# Patient Record
Sex: Male | Born: 1970
Health system: Southern US, Community
[De-identification: ages and names within clinical notes are randomized; demographics above are authoritative.]

## PROBLEM LIST (undated history)

## (undated) DIAGNOSIS — Q6602 Congenital talipes equinovarus, left foot: Secondary | ICD-10-CM

## (undated) DIAGNOSIS — I4891 Unspecified atrial fibrillation: Secondary | ICD-10-CM

## (undated) DIAGNOSIS — E559 Vitamin D deficiency, unspecified: Secondary | ICD-10-CM

## (undated) DIAGNOSIS — R479 Unspecified speech disturbances: Secondary | ICD-10-CM

## (undated) DIAGNOSIS — N189 Chronic kidney disease, unspecified: Secondary | ICD-10-CM

## (undated) DIAGNOSIS — Q213 Tetralogy of Fallot: Secondary | ICD-10-CM

## (undated) DIAGNOSIS — I509 Heart failure, unspecified: Secondary | ICD-10-CM

## (undated) DIAGNOSIS — Q6601 Congenital talipes equinovarus, right foot: Secondary | ICD-10-CM

## (undated) DIAGNOSIS — Q6689 Other  specified congenital deformities of feet: Secondary | ICD-10-CM

## (undated) DIAGNOSIS — I10 Essential (primary) hypertension: Secondary | ICD-10-CM

## (undated) DIAGNOSIS — I517 Cardiomegaly: Secondary | ICD-10-CM

## (undated) HISTORY — PX: ABDOMINAL HERNIA REPAIR: SHX539

## (undated) HISTORY — DX: Unspecified atrial fibrillation: I48.91

## (undated) HISTORY — PX: VSD REPAIR: SHX276

## (undated) HISTORY — DX: Tetralogy of Fallot: Q21.3

## (undated) HISTORY — DX: Essential (primary) hypertension: I10

## (undated) HISTORY — PX: WRIST SURGERY: SHX841

## (undated) HISTORY — DX: Congenital talipes equinovarus, left foot: Q66.02

## (undated) HISTORY — PX: OTHER SURGICAL HISTORY: SHX169

## (undated) HISTORY — DX: Vitamin D deficiency, unspecified: E55.9

## (undated) HISTORY — PX: MOUTH SURGERY: SHX715

## (undated) HISTORY — DX: Heart failure, unspecified: I50.9

## (undated) HISTORY — DX: Congenital talipes equinovarus, right foot: Q66.01

## (undated) HISTORY — PX: ADENOIDECTOMY: SUR15

## (undated) HISTORY — DX: Other specified congenital deformities of feet: Q66.89

## (undated) HISTORY — DX: Cardiomegaly: I51.7

## (undated) HISTORY — DX: Chronic kidney disease, unspecified: N18.9

## (undated) HISTORY — DX: Unspecified speech disturbances: R47.9

---

## 2011-05-16 ENCOUNTER — Ambulatory Visit
Admission: RE | Admit: 2011-05-16 | Discharge: 2011-05-16 | Disposition: A | Discharge status: Home / Self Care | Payer: PRIVATE HEALTH INSURANCE | Source: Ambulatory Visit | Attending: Orthopedic Surgery | Admitting: Orthopedic Surgery

## 2011-05-16 ENCOUNTER — Other Ambulatory Visit: Payer: Self-pay | Admitting: Orthopedic Surgery

## 2011-05-16 DIAGNOSIS — S62109A Fracture of unspecified carpal bone, unspecified wrist, initial encounter for closed fracture: Secondary | ICD-10-CM

## 2011-05-20 ENCOUNTER — Ambulatory Visit (HOSPITAL_COMMUNITY)
Admission: RE | Admit: 2011-05-20 | Discharge: 2011-05-20 | Disposition: A | Discharge status: Home / Self Care | Payer: PRIVATE HEALTH INSURANCE | Source: Ambulatory Visit | Attending: Orthopedic Surgery | Admitting: Orthopedic Surgery

## 2011-05-20 ENCOUNTER — Other Ambulatory Visit: Payer: Self-pay | Admitting: Orthopedic Surgery

## 2011-05-20 ENCOUNTER — Encounter (HOSPITAL_COMMUNITY)
Admission: RE | Admit: 2011-05-20 | Discharge: 2011-05-20 | Disposition: A | Discharge status: Home / Self Care | Payer: PRIVATE HEALTH INSURANCE | Source: Ambulatory Visit | Attending: Orthopedic Surgery | Admitting: Orthopedic Surgery

## 2011-05-20 DIAGNOSIS — M4120 Other idiopathic scoliosis, site unspecified: Secondary | ICD-10-CM | POA: Insufficient documentation

## 2011-05-20 DIAGNOSIS — Z01812 Encounter for preprocedural laboratory examination: Secondary | ICD-10-CM | POA: Insufficient documentation

## 2011-05-20 DIAGNOSIS — Z01818 Encounter for other preprocedural examination: Secondary | ICD-10-CM

## 2011-05-20 DIAGNOSIS — I517 Cardiomegaly: Secondary | ICD-10-CM | POA: Insufficient documentation

## 2011-05-20 LAB — PROTIME-INR
INR: 0.99 (ref 0.00–1.49)
Prothrombin Time: 13.3 seconds (ref 11.6–15.2)

## 2011-05-20 LAB — SURGICAL PCR SCREEN
MRSA, PCR: NEGATIVE
Staphylococcus aureus: NEGATIVE

## 2011-05-20 LAB — CBC
Hemoglobin: 14.8 g/dL (ref 13.0–17.0)
Platelets: 155 10*3/uL (ref 150–400)
RBC: 4.73 MIL/uL (ref 4.22–5.81)

## 2011-05-20 LAB — BASIC METABOLIC PANEL
Calcium: 9.3 mg/dL (ref 8.4–10.5)
GFR calc Af Amer: 37 mL/min — ABNORMAL LOW (ref >60)
GFR calc non Af Amer: 31 mL/min — ABNORMAL LOW (ref >60)
Glucose, Bld: 83 mg/dL (ref 70–99)
Potassium: 5.6 mEq/L — ABNORMAL HIGH (ref 3.5–5.1)
Sodium: 138 mEq/L (ref 135–145)

## 2011-05-20 LAB — APTT: aPTT: 27 seconds (ref 24–37)

## 2011-05-23 ENCOUNTER — Ambulatory Visit (HOSPITAL_COMMUNITY)
Admission: RE | Admit: 2011-05-23 | Discharge: 2011-05-24 | Disposition: A | Discharge status: Home / Self Care | Payer: PRIVATE HEALTH INSURANCE | Source: Ambulatory Visit | Attending: Orthopedic Surgery | Admitting: Orthopedic Surgery

## 2011-05-23 DIAGNOSIS — Z01818 Encounter for other preprocedural examination: Secondary | ICD-10-CM | POA: Insufficient documentation

## 2011-05-23 DIAGNOSIS — Z01812 Encounter for preprocedural laboratory examination: Secondary | ICD-10-CM | POA: Insufficient documentation

## 2011-05-23 DIAGNOSIS — Z79899 Other long term (current) drug therapy: Secondary | ICD-10-CM | POA: Insufficient documentation

## 2011-05-23 DIAGNOSIS — Z7982 Long term (current) use of aspirin: Secondary | ICD-10-CM | POA: Insufficient documentation

## 2011-05-23 DIAGNOSIS — I10 Essential (primary) hypertension: Secondary | ICD-10-CM | POA: Insufficient documentation

## 2011-05-23 DIAGNOSIS — X58XXXA Exposure to other specified factors, initial encounter: Secondary | ICD-10-CM | POA: Insufficient documentation

## 2011-05-23 DIAGNOSIS — K219 Gastro-esophageal reflux disease without esophagitis: Secondary | ICD-10-CM | POA: Insufficient documentation

## 2011-05-23 DIAGNOSIS — Z86718 Personal history of other venous thrombosis and embolism: Secondary | ICD-10-CM | POA: Insufficient documentation

## 2011-05-23 DIAGNOSIS — S52539A Colles' fracture of unspecified radius, initial encounter for closed fracture: Secondary | ICD-10-CM | POA: Insufficient documentation

## 2011-05-23 LAB — POCT I-STAT, CHEM 8
BUN: 36 mg/dL — ABNORMAL HIGH (ref 6–23)
Calcium, Ion: 1.14 mmol/L (ref 1.12–1.32)
Hemoglobin: 15.3 g/dL (ref 13.0–17.0)
TCO2: 27 mmol/L (ref 0–100)

## 2011-06-04 NOTE — Op Note (Signed)
Bradley Graham, Bradley Graham                 ACCOUNT NO.:  [id number]  MEDICAL RECORD NO.:  [id number]  LOCATION:  [id number]                         FACILITY:  [facility]  PHYSICIAN:  Melrose Nakayama, MD  DATE OF BIRTH:  Jul 17, 1971  DATE OF PROCEDURE:  05/23/2011 DATE OF DISCHARGE:                              OPERATIVE REPORT   PREOPERATIVE DIAGNOSES:  Left wrist intra-articular distal radius fracture, radial styloid fracture.  POSTOPERATIVE DIAGNOSES:  Left wrist intra-articular distal radius fracture, radial styloid fracture.  ATTENDING SURGEON:  Linna Hoff IV, MD who scrubbed and present for the entire procedure.  ASSISTANT SURGEON:  None.  ANESTHESIA:  Axillary block with Graham sedation.  SURGICAL IMPLANT:  Biomet headless screws 3.0 mm, 2.13 mm, 32 mm, and 22 mm respective lengths.  SURGICAL PROCEDURES:  Open treatment of displaced intra-articular distal radius fracture, two more fragments.  RADIOGRAPHS REVIEWED:  Left wrist.SURGICAL INDICATIONS:  Bradley Graham is a 40 year old right-hand-dominant gentleman who sustained a displaced intra-articular distal radius fracture.  Preoperative flat plane was carried out and after options were discussed with he and his mother, they elected to proceed with the above procedure.  The risks, benefits, and alternatives were discussed in detail with the patient and his the mother, and a signed informed consent was obtained.  Risks include but not limited to bleeding, infection, damage to nearby nerves, arteries, or tendons, nonunion, malunion, hardware failure, loss motion of wrist and digits, and need for further surgical intervention.  DESCRIPTION OF PROCEDURE:  The patient was brought and identified in the preoperative holding area, mark with permanent marker was made on the left wrist indicate correct operative site.  The patient underwent axillary block anesthesia performed by Dr. Oletta Graham.  Left upper extremity, the patient was then brought  back to the operating room and placed supine on the anesthesia room table where the Graham sedation was administered.  Well-padded tourniquet was then placed on the left brachium and sealed wound with a 1000 drape.  Left upper extremity was then prepped and draped in normal sterile fashion.  Time-out was called. Correct side was identified and the procedure was then begun.  Attention was then turned to the left wrist where a longitudinal incision was made directly over the radial styloid section was then carried down through the skin, subcutaneous tissue, first dorsal part tendons were then incised longitudinally and tendon sheath released.  Brachioradialis was then released off the radial styloid to obtain visualization of the fracture site.  Fracture site was then opened up and then using a small reduction clamp, held adequately in place.  After open reduction was then performed, the two guidewires with the cannulated screws sets were then used and placed in a parallel fashion across perpendicular to the fracture site.  These were predrilled with the appropriate drill bit. The appropriate length cannulated screws were then placed 2.3 and 3.0. The K-wires were then removed.  Radiographs were obtained and reviewed showing adequate placement of the K-wire fixation of the screw fixation. Good restoration of the radial articular surface.  The wounds were then thoroughly irrigated.  Brachioradialis had been released, was not repaired.  The first dorsal compartment tendon sheath  was then reapproximated with 3-0 Monocryl suture.  Subcutaneous tissue was closed with 3-0 Monocryl suture and the skin closed with a running 4-0 Prolene subcuticular suture.  Tourniquet was then deflated.  There was good perfusion of the had.  After the patient was then placed in a well- molded volar splint, extubated and taken to recovery room in good condition.  Intraoperative radiographs 3 views of the wrist do show  the screw fixation in place in the radial styloid with good position of the implant and good articular congruity.  Intraoperative stress radiography was done during this case.  There was no intercarpal widening as well as assessment of the SL interval and LT interval.  POSTOPERATIVE PLAN:  The patient admitted overnight for Graham antibiotics and pain control, discharged in the morning, seen back in the office in approximately 10-14 days for wound check, suture removal, application of a total short-arm cast immobilization for 4 weeks radiographs in each visit.     Melrose Nakayama, MD     FWO/MEDQ  D:  05/23/2011  T:  05/24/2011  Job:  [id number]  Electronically Signed by Bradley Planas IV MD on 06/04/2011 08:45:56 PM

## 2011-12-09 DIAGNOSIS — N179 Acute kidney failure, unspecified: Secondary | ICD-10-CM | POA: Insufficient documentation

## 2011-12-15 DIAGNOSIS — I471 Supraventricular tachycardia: Secondary | ICD-10-CM | POA: Insufficient documentation

## 2011-12-15 DIAGNOSIS — I4892 Unspecified atrial flutter: Secondary | ICD-10-CM | POA: Insufficient documentation

## 2012-08-08 DIAGNOSIS — Q213 Tetralogy of Fallot: Secondary | ICD-10-CM | POA: Insufficient documentation

## 2012-09-17 ENCOUNTER — Ambulatory Visit: Payer: PRIVATE HEALTH INSURANCE | Attending: Orthopaedic Surgery | Admitting: Physical Therapy

## 2012-09-17 DIAGNOSIS — M25676 Stiffness of unspecified foot, not elsewhere classified: Secondary | ICD-10-CM | POA: Insufficient documentation

## 2012-09-17 DIAGNOSIS — IMO0001 Reserved for inherently not codable concepts without codable children: Secondary | ICD-10-CM | POA: Insufficient documentation

## 2012-09-17 DIAGNOSIS — M25579 Pain in unspecified ankle and joints of unspecified foot: Secondary | ICD-10-CM | POA: Insufficient documentation

## 2012-09-17 DIAGNOSIS — M25673 Stiffness of unspecified ankle, not elsewhere classified: Secondary | ICD-10-CM | POA: Insufficient documentation

## 2012-09-17 DIAGNOSIS — R5381 Other malaise: Secondary | ICD-10-CM | POA: Insufficient documentation

## 2012-09-20 ENCOUNTER — Ambulatory Visit: Payer: PRIVATE HEALTH INSURANCE | Admitting: Physical Therapy

## 2012-09-25 ENCOUNTER — Ambulatory Visit: Payer: PRIVATE HEALTH INSURANCE | Admitting: Physical Therapy

## 2012-09-27 ENCOUNTER — Ambulatory Visit: Payer: PRIVATE HEALTH INSURANCE | Admitting: Physical Therapy

## 2012-10-02 ENCOUNTER — Ambulatory Visit: Payer: PRIVATE HEALTH INSURANCE | Admitting: Physical Therapy

## 2012-10-04 ENCOUNTER — Ambulatory Visit: Payer: PRIVATE HEALTH INSURANCE | Admitting: Physical Therapy

## 2012-10-08 ENCOUNTER — Ambulatory Visit: Payer: PRIVATE HEALTH INSURANCE | Attending: Orthopaedic Surgery | Admitting: Physical Therapy

## 2012-10-08 DIAGNOSIS — M25676 Stiffness of unspecified foot, not elsewhere classified: Secondary | ICD-10-CM | POA: Insufficient documentation

## 2012-10-08 DIAGNOSIS — M25673 Stiffness of unspecified ankle, not elsewhere classified: Secondary | ICD-10-CM | POA: Insufficient documentation

## 2012-10-08 DIAGNOSIS — R5381 Other malaise: Secondary | ICD-10-CM | POA: Insufficient documentation

## 2012-10-08 DIAGNOSIS — M25579 Pain in unspecified ankle and joints of unspecified foot: Secondary | ICD-10-CM | POA: Insufficient documentation

## 2012-10-08 DIAGNOSIS — IMO0001 Reserved for inherently not codable concepts without codable children: Secondary | ICD-10-CM | POA: Insufficient documentation

## 2012-10-10 ENCOUNTER — Ambulatory Visit: Payer: PRIVATE HEALTH INSURANCE | Admitting: Physical Therapy

## 2012-10-12 ENCOUNTER — Ambulatory Visit: Payer: PRIVATE HEALTH INSURANCE | Admitting: *Deleted

## 2012-10-17 ENCOUNTER — Ambulatory Visit: Payer: PRIVATE HEALTH INSURANCE | Admitting: Physical Therapy

## 2012-10-19 ENCOUNTER — Ambulatory Visit: Payer: PRIVATE HEALTH INSURANCE | Admitting: Physical Therapy

## 2012-10-22 ENCOUNTER — Encounter: Payer: PRIVATE HEALTH INSURANCE | Admitting: Physical Therapy

## 2012-10-24 ENCOUNTER — Ambulatory Visit: Payer: PRIVATE HEALTH INSURANCE | Admitting: Physical Therapy

## 2012-10-26 ENCOUNTER — Ambulatory Visit: Payer: PRIVATE HEALTH INSURANCE | Admitting: Physical Therapy

## 2012-10-30 ENCOUNTER — Ambulatory Visit: Payer: PRIVATE HEALTH INSURANCE | Admitting: Physical Therapy

## 2012-10-31 ENCOUNTER — Ambulatory Visit: Payer: PRIVATE HEALTH INSURANCE | Admitting: Physical Therapy

## 2012-11-05 ENCOUNTER — Ambulatory Visit: Payer: PRIVATE HEALTH INSURANCE | Attending: Orthopaedic Surgery | Admitting: Physical Therapy

## 2012-11-05 DIAGNOSIS — M25676 Stiffness of unspecified foot, not elsewhere classified: Secondary | ICD-10-CM | POA: Insufficient documentation

## 2012-11-05 DIAGNOSIS — M25673 Stiffness of unspecified ankle, not elsewhere classified: Secondary | ICD-10-CM | POA: Insufficient documentation

## 2012-11-05 DIAGNOSIS — M25579 Pain in unspecified ankle and joints of unspecified foot: Secondary | ICD-10-CM | POA: Insufficient documentation

## 2012-11-05 DIAGNOSIS — IMO0001 Reserved for inherently not codable concepts without codable children: Secondary | ICD-10-CM | POA: Insufficient documentation

## 2012-11-05 DIAGNOSIS — R5381 Other malaise: Secondary | ICD-10-CM | POA: Insufficient documentation

## 2012-11-07 ENCOUNTER — Ambulatory Visit: Payer: PRIVATE HEALTH INSURANCE | Admitting: Physical Therapy

## 2012-11-09 ENCOUNTER — Ambulatory Visit: Payer: PRIVATE HEALTH INSURANCE | Admitting: Physical Therapy

## 2012-11-12 ENCOUNTER — Ambulatory Visit: Payer: PRIVATE HEALTH INSURANCE | Admitting: Physical Therapy

## 2012-11-14 ENCOUNTER — Ambulatory Visit: Payer: PRIVATE HEALTH INSURANCE | Admitting: Physical Therapy

## 2012-11-16 ENCOUNTER — Encounter: Payer: PRIVATE HEALTH INSURANCE | Admitting: Physical Therapy

## 2012-11-19 ENCOUNTER — Ambulatory Visit: Payer: PRIVATE HEALTH INSURANCE | Admitting: *Deleted

## 2013-01-30 DIAGNOSIS — G801 Spastic diplegic cerebral palsy: Secondary | ICD-10-CM | POA: Insufficient documentation

## 2013-05-20 ENCOUNTER — Ambulatory Visit: Payer: Self-pay | Admitting: Family Medicine

## 2013-06-25 ENCOUNTER — Ambulatory Visit (INDEPENDENT_AMBULATORY_CARE_PROVIDER_SITE_OTHER): Payer: PRIVATE HEALTH INSURANCE | Admitting: Family Medicine

## 2013-06-25 ENCOUNTER — Encounter: Payer: Self-pay | Admitting: Family Medicine

## 2013-06-25 VITALS — BP 127/85 | HR 108 | Temp 98.2°F | Wt 172.0 lb

## 2013-06-25 DIAGNOSIS — Q6689 Other  specified congenital deformities of feet: Secondary | ICD-10-CM

## 2013-06-25 DIAGNOSIS — Q6601 Congenital talipes equinovarus, right foot: Secondary | ICD-10-CM | POA: Insufficient documentation

## 2013-06-25 DIAGNOSIS — Z9889 Other specified postprocedural states: Secondary | ICD-10-CM

## 2013-06-25 DIAGNOSIS — Z8774 Personal history of (corrected) congenital malformations of heart and circulatory system: Secondary | ICD-10-CM

## 2013-06-25 DIAGNOSIS — R4789 Other speech disturbances: Secondary | ICD-10-CM

## 2013-06-25 DIAGNOSIS — N189 Chronic kidney disease, unspecified: Secondary | ICD-10-CM | POA: Insufficient documentation

## 2013-06-25 DIAGNOSIS — E559 Vitamin D deficiency, unspecified: Secondary | ICD-10-CM | POA: Insufficient documentation

## 2013-06-25 DIAGNOSIS — R479 Unspecified speech disturbances: Secondary | ICD-10-CM | POA: Insufficient documentation

## 2013-06-25 NOTE — Patient Instructions (Addendum)
      Dr Paula Libra Recommendations  Diet and Exercise discussed with patient.  For nutrition information, I recommend books:  1).Eat to Live by Dr Excell Seltzer. 2).Prevent and Reverse Heart Disease by Dr Karl Luke. 3) Dr Janene Harvey Book:  Program to Reverse Diabetes 4 ) the [place] Study by T. Heath Gold  Exercise recommendations are:   If unable to walk, then the patient can exercise in a chair 3 times a day. By flapping arms like a bird gently and raising legs outwards to the front.  If ambulatory, the patient can go for walks for 30 minutes 3 times a week. Then increase the intensity and duration as tolerated.  Goal is to try to attain exercise frequency to 5 times a week.  If applicable: Best to perform resistance exercises (machines or weights) 2 days a week and cardio type exercises 3 days per week.

## 2013-06-25 NOTE — Progress Notes (Signed)
Patient ID: Bradley Graham, male   DOB: [birth year], 42 y.o.   MRN: [id number] SUBJECTIVE: CC: Chief Complaint  Patient presents with  . Follow-up    6 month follow up     HPI:  Can tell when it is going to rain. Pressure feeling in his feet.   Has a positive report from his kidney doctors. Doing good. Labs off of mom's iPhone: Vitamin D deficient Cholesterol total:169 LDLc:85 HDLc:55 TG 131 Cr1.97 Vit D : 9.41 hgba1c 6.3  Multiple medical issues are all stable.  Past Medical History  Diagnosis Date  . Club foot of both feet   . Speech impediment   . Tetralogy of Fallot     s/p waterson shunt  . CKD (chronic kidney disease)   . Left ventricular hypertrophy   . Right ventricular hypertrophy   . CHF (congestive heart failure)   . Hypertension   . Atrial fibrillation   . Vitamin D deficiency    Past Surgical History  Procedure Laterality Date  . Vsd repair    . Scoliosis    . Adenoidectomy    . Mouth surgery    . Wrist surgery Left    History   Social History  . Marital Status: Single    Spouse Name: N/A    Number of Children: N/A  . Years of Education: N/A   Occupational History  . Not on file.   Social History Main Topics  . Smoking status: Never Smoker   . Smokeless tobacco: Not on file  . Alcohol Use: Not on file  . Drug Use: Not on file  . Sexually Active: Not on file   Other Topics Concern  . Not on file   Social History Narrative  . No narrative on file   Family History  Problem Relation Age of Onset  . Diabetes Mother   . Hyperlipidemia Mother   . Diabetes Father   . Hyperlipidemia Father    No current outpatient prescriptions on file prior to visit.   No current facility-administered medications on file prior to visit.   Allergies  Allergen Reactions  . Aleve (Naproxen Sodium)   . Bactrim (Sulfamethoxazole W-Trimethoprim)   . Erythromycin   . Heparin     Induced thrombopenia  . Nsaids   . Penicillins     There is no  immunization history on file for this patient. Prior to Admission medications   Medication Sig Start Date End Date Taking? Authorizing Provider  Cholecalciferol (VITAMIN D) 2000 UNITS CAPS Take 1 capsule by mouth daily.   Yes Historical Provider, MD  docusate sodium (COLACE) 100 MG capsule Take 100 mg by mouth daily.   Yes Historical Provider, MD  enalapril (VASOTEC) 5 MG tablet 5 mg 2 (two) times daily.  06/15/13  Yes Historical Provider, MD  loratadine (CLARITIN) 10 MG tablet Take 10 mg by mouth daily.   Yes Historical Provider, MD  lovastatin (MEVACOR) 20 MG tablet Take 20 mg by mouth at bedtime.  06/15/13  Yes Historical Provider, MD  pantoprazole (PROTONIX) 40 MG tablet Take 40 mg by mouth daily.  06/04/13  Yes Historical Provider, MD  aspirin 81 MG tablet Take 81 mg by mouth daily.    Historical Provider, MD     ROS: As above in the HPI. All other systems are stable or negative.  OBJECTIVE: APPEARANCE:  Patient in no acute distress.The patient appeared well nourished and normally developed. Acyanotic. Waist: VITAL SIGNS:BP 127/85  Pulse 108  Temp(Src) 98.2  F (36.8 C) (Oral)  Wt 172 lb (78.019 kg)  WM  SKIN: warm and  Dry without overt rashes, tattoos and scars  HEAD and Neck: without JVD, Head and scalp: normal Eyes:No scleral icterus. Fundi normal, eye movements normal. Ears: Auricle normal, canal normal, Tympanic membranes normal, insufflation normal. Nose: normal Throat: no changesNeck & thyroid: no changes  CHEST & LUNGS: Chest wall: normal Lungs: Clear  CVS: Reveals the PMI to be normally located. Regular rhythm, No changes in his heart murmurs. S/P TOF with cardiac repairs.  Peripheral vasculature: Radial pulses: normal Dorsal pedis pulses: normal Posterior pulses: normal  ABDOMEN:  Appearance: normal Benign, no organomegaly, no masses, no Abdominal Aortic enlargement. No Guarding , no rebound. No Bruits. Bowel sounds: normal  RECTAL: N/A GU:  N/A  EXTREMETIES: nonedematous. l.  MUSCULOSKELETAL:  Feet deformities from club foot surgeries are stable and looks good.  NEUROLOGIC: oriented to time,place and person; no changes.  ASSESSMENT: Speech impediment secondary to organic lesion  S/P TOF (tetralogy of Fallot) repair  CKD (chronic kidney disease), unspecified stage  Vitamin D deficiency  Club foot of both feet - S/P surgical reconstruction.  PLAN: Patient being followed regular by cardiology and renal at [facility] and the appropriate labs are drawn. Mom is now  Retired and fulltime at home and able to be more available in the day to day needs of the patient who is active and ambulatory. No change in meds. Will need to see annually.  Have  Reports sent to [place] regularly from [facility].  Return in about 1 year (around 06/25/2014) for Recheck medical problems.  Christon Parada P. Jacelyn Grip, M.D.

## 2014-01-20 ENCOUNTER — Telehealth: Payer: Self-pay | Admitting: Family Medicine

## 2014-01-20 NOTE — Telephone Encounter (Signed)
Spoke with mother and she was offered appt for thurs but told her if weather was not bad to please call us tomorrow and we would get him in

## 2014-01-23 ENCOUNTER — Ambulatory Visit: Payer: PRIVATE HEALTH INSURANCE | Admitting: Family Medicine

## 2014-01-27 ENCOUNTER — Encounter: Payer: Self-pay | Admitting: Family Medicine

## 2014-01-27 ENCOUNTER — Ambulatory Visit (INDEPENDENT_AMBULATORY_CARE_PROVIDER_SITE_OTHER): Payer: PRIVATE HEALTH INSURANCE

## 2014-01-27 ENCOUNTER — Ambulatory Visit (INDEPENDENT_AMBULATORY_CARE_PROVIDER_SITE_OTHER): Payer: PRIVATE HEALTH INSURANCE | Admitting: Family Medicine

## 2014-01-27 VITALS — BP 143/84 | HR 100 | Temp 97.9°F | Ht 64.0 in | Wt 180.0 lb

## 2014-01-27 DIAGNOSIS — Z8774 Personal history of (corrected) congenital malformations of heart and circulatory system: Secondary | ICD-10-CM

## 2014-01-27 DIAGNOSIS — Q6601 Congenital talipes equinovarus, right foot: Secondary | ICD-10-CM

## 2014-01-27 DIAGNOSIS — R52 Pain, unspecified: Secondary | ICD-10-CM

## 2014-01-27 DIAGNOSIS — N189 Chronic kidney disease, unspecified: Secondary | ICD-10-CM

## 2014-01-27 DIAGNOSIS — R479 Unspecified speech disturbances: Secondary | ICD-10-CM

## 2014-01-27 DIAGNOSIS — E559 Vitamin D deficiency, unspecified: Secondary | ICD-10-CM

## 2014-01-27 DIAGNOSIS — Q6600 Congenital talipes equinovarus, unspecified foot: Secondary | ICD-10-CM

## 2014-01-27 DIAGNOSIS — K439 Ventral hernia without obstruction or gangrene: Secondary | ICD-10-CM

## 2014-01-27 DIAGNOSIS — R4789 Other speech disturbances: Secondary | ICD-10-CM

## 2014-01-27 DIAGNOSIS — Z9889 Other specified postprocedural states: Secondary | ICD-10-CM

## 2014-01-27 DIAGNOSIS — Q6602 Congenital talipes equinovarus, left foot: Secondary | ICD-10-CM

## 2014-01-27 NOTE — Progress Notes (Signed)
Patient ID: Bradley Graham, male   DOB: 14-May-1971, 43 y.o.   MRN: [id number] SUBJECTIVE: CC: Chief Complaint  Patient presents with  . Acute Visit    place on left chest area  mother states has  been walking on treadmill     HPI: Goes to PT and was exercising. 2 weeks ago. Was sore in the upper abdomen and had a swelling in the area of his  Surgery. It is getting better now. Was to get a cardiac MRI but he physically couldn't get it done. So the cardiologist at [facility] is considering what options are available to evaluate the cardiac valves. He is doing well otherwise.  Past Medical History  Diagnosis Date  . Club foot of both feet   . Speech impediment   . Tetralogy of Fallot     s/p waterson shunt  . CKD (chronic kidney disease)   . Left ventricular hypertrophy   . Right ventricular hypertrophy   . CHF (congestive heart failure)   . Hypertension   . Atrial fibrillation   . Vitamin D deficiency    Past Surgical History  Procedure Laterality Date  . Vsd repair    . Scoliosis    . Adenoidectomy    . Mouth surgery    . Wrist surgery Left    History   Social History  . Marital Status: Single    Spouse Name: N/A    Number of Children: N/A  . Years of Education: N/A   Occupational History  . Not on file.   Social History Main Topics  . Smoking status: Never Smoker   . Smokeless tobacco: Not on file  . Alcohol Use: Not on file  . Drug Use: Not on file  . Sexual Activity: Not on file   Other Topics Concern  . Not on file   Social History Narrative  . No narrative on file   Family History  Problem Relation Age of Onset  . Diabetes Mother   . Hyperlipidemia Mother   . Diabetes Father   . Hyperlipidemia Father    Current Outpatient Prescriptions on File Prior to Visit  Medication Sig Dispense Refill  . aspirin 81 MG tablet Take 81 mg by mouth daily.      . Cholecalciferol (VITAMIN D) 2000 UNITS CAPS Take 1 capsule by mouth daily.      Marland Kitchen docusate sodium  (COLACE) 100 MG capsule Take 100 mg by mouth daily.      . enalapril (VASOTEC) 5 MG tablet 5 mg 2 (two) times daily.       Marland Kitchen loratadine (CLARITIN) 10 MG tablet Take 10 mg by mouth daily.      Marland Kitchen lovastatin (MEVACOR) 20 MG tablet Take 20 mg by mouth at bedtime.       . pantoprazole (PROTONIX) 40 MG tablet Take 40 mg by mouth daily.        No current facility-administered medications on file prior to visit.   Allergies  Allergen Reactions  . Aleve [Naproxen Sodium]   . Bactrim [Sulfamethoxazole-Trimethoprim]   . Erythromycin   . Heparin     Induced thrombopenia  . Nsaids   . Penicillins     There is no immunization history on file for this patient. Prior to Admission medications   Medication Sig Start Date End Date Taking? Authorizing Provider  aspirin 81 MG tablet Take 81 mg by mouth daily.    Historical Provider, MD  Cholecalciferol (VITAMIN D) 2000 UNITS CAPS Take 1  capsule by mouth daily.    Historical Provider, MD  docusate sodium (COLACE) 100 MG capsule Take 100 mg by mouth daily.    Historical Provider, MD  enalapril (VASOTEC) 5 MG tablet 5 mg 2 (two) times daily.  06/15/13   Historical Provider, MD  loratadine (CLARITIN) 10 MG tablet Take 10 mg by mouth daily.    Historical Provider, MD  lovastatin (MEVACOR) 20 MG tablet Take 20 mg by mouth at bedtime.  06/15/13   Historical Provider, MD  pantoprazole (PROTONIX) 40 MG tablet Take 40 mg by mouth daily.  06/04/13   Historical Provider, MD     ROS: As above in the HPI. All other systems are stable or negative.  OBJECTIVE: APPEARANCE:  Patient in no acute distress.The patient appeared well nourished and normally developed. Acyanotic. Waist: VITAL SIGNS:BP 143/84  Pulse 100  Temp(Src) 97.9 F (36.6 C) (Oral)  Ht 5\' 4"  (1.626 m)  Wt 180 lb (81.647 kg)  BMI 30.88 kg/m2  WM with speech deficit . Not new  Kyphoscoliosis.   SKIN: warm and  Dry without overt rashes, tattoos and scars  HEAD and Neck: without JVD, Head and  scalp: normal Eyes:No scleral icterus. Fundi normal, eye movements normal. Ears: Auricle normal, canal normal, Tympanic membranes normal, insufflation normal. Nose: normal Throat: normal Neck & thyroid: normal  CHEST & LUNGS: Chest wall: normal Lungs: Clear  CVS:  Regular rhythm, First and Second Heart sounds no changeabsence of murmurs, rubs or gallops. No change in heart sounds and murmur. Peripheral vasculature: Radial pulses: normal Dorsal pedis pulses: normal Posterior pulses: normal  ABDOMEN:  Appearance: obese with a 1 1/2 diameter abdominal weakness and ventral hernia to the left of the epigastrium. Near his surgical scars.  Benign, no organomegaly, no masses, no Abdominal Aortic enlargement. No Guarding , no rebound. No Bruits. Bowel sounds: normal  RECTAL: N/A GU: N/A  EXTREMETIES: nonedematous.  MUSCULOSKELETAL:  S/p Club foot surgery doing better  NEUROLOGIC: oriented to time,place and person; nonfocal.  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 DG Chest 2 View    Standing Status: Future     Number of Occurrences: 1     Standing Expiration Date: 03/29/2015    Order Specific Question:  Reason for Exam (SYMPTOM  OR DIAGNOSIS REQUIRED)    Answer:  pain    Order Specific Question:  Preferred imaging location?    Answer:  Internal  . Ambulatory referral to General Surgery    Referral Priority:  Routine    Referral Type:  Surgical    Referral Reason:  Specialty Services Required    Requested Specialty:  General Surgery    Number of Visits Requested:  1  [facility] reading (PRIMARY) by  Dr. Jacelyn Grip.Kyphoscoliosis, cardiomegaly. Chronic changes from  S/P Tetralogy of Fallot and surgery as a child.                                Meds ordered this encounter  Medications  . clindamycin (CLEOCIN) 150 MG capsule    Sig:    There are no discontinued medications. Return if symptoms worsen or fail  to improve, for Recheck medical problems. He is followed at [facility] and comes here prn and annually.  Lawana Hartzell P. Jacelyn Grip, M.D.

## 2014-02-01 ENCOUNTER — Ambulatory Visit (INDEPENDENT_AMBULATORY_CARE_PROVIDER_SITE_OTHER): Payer: PRIVATE HEALTH INSURANCE | Admitting: Family Medicine

## 2014-02-01 ENCOUNTER — Encounter: Payer: Self-pay | Admitting: Family Medicine

## 2014-02-01 VITALS — BP 137/84 | HR 101 | Temp 98.0°F | Ht 64.0 in | Wt 183.2 lb

## 2014-02-01 DIAGNOSIS — J329 Chronic sinusitis, unspecified: Secondary | ICD-10-CM

## 2014-02-01 DIAGNOSIS — H60399 Other infective otitis externa, unspecified ear: Secondary | ICD-10-CM

## 2014-02-01 MED ORDER — NEOMYCIN-COLIST-HC-THONZONIUM 3.3-3-10-0.5 MG/ML OT SUSP: 3.0000 [drp] | Freq: Four times a day (QID) | OTIC | Status: DC

## 2014-02-01 MED ORDER — LEVOFLOXACIN 500 MG PO TABS: 500.0000 mg | ORAL_TABLET | Freq: Every day | ORAL | Status: DC

## 2014-02-01 NOTE — Progress Notes (Signed)
   Subjective:    Patient ID: Bradley Graham, male    DOB: Jun 03, 1971, 43 y.o.   MRN: [id number]  HPI  This 43 y.o. male presents for evaluation of URI and right ear discomfort for 2 days. He has hx of otitis externa..  Review of Systems C/o right ear discomfort and URI sx's   No chest pain, SOB, HA, dizziness, vision change, N/V, diarrhea, constipation, dysuria, urinary urgency or frequency, myalgias, arthralgias or rash.  Objective:   Physical Exam  Vital signs noted  Well developed well nourished male.  HEENT - Head atraumatic Normocephalic                Eyes - PERRLA, Conjuctiva - clear Sclera- Clear EOMI                Ears - Right EAC with whitish clear drainage and TM not visualized and left EAC wnl                            And TM normal                Nose - Nares patent                 Throat - oropharanx wnl Respiratory - Lungs CTA bilateral Cardiac - RRR S1 and S2 without murmur       Assessment & Plan:  Sinusitis - Plan: levofloxacin (LEVAQUIN) 500 MG tablet, neomycin-colistin-hydrocortisone-thonzonium (CORTISPORIN-TC) 3.02-04-09-0.5 MG/ML otic suspension  Otitis, externa, infective - Plan: levofloxacin (LEVAQUIN) 500 MG tablet, neomycin-colistin-hydrocortisone-thonzonium (CORTISPORIN-TC) 3.02-04-09-0.5 MG/ML otic suspension  Push po fluids, rest, tylenol and motrin otc prn as directed for fever, arthralgias, and myalgias.  Follow up prn if sx's continue or persist.  Lysbeth Penner FNP

## 2014-02-13 ENCOUNTER — Telehealth: Payer: Self-pay | Admitting: Family Medicine

## 2014-02-14 ENCOUNTER — Encounter (INDEPENDENT_AMBULATORY_CARE_PROVIDER_SITE_OTHER): Payer: Self-pay | Admitting: Surgery

## 2014-02-20 ENCOUNTER — Ambulatory Visit (INDEPENDENT_AMBULATORY_CARE_PROVIDER_SITE_OTHER): Payer: PRIVATE HEALTH INSURANCE | Admitting: Surgery

## 2014-02-28 ENCOUNTER — Ambulatory Visit (INDEPENDENT_AMBULATORY_CARE_PROVIDER_SITE_OTHER): Payer: PRIVATE HEALTH INSURANCE | Admitting: Surgery

## 2014-02-28 ENCOUNTER — Encounter (INDEPENDENT_AMBULATORY_CARE_PROVIDER_SITE_OTHER): Payer: Self-pay | Admitting: Surgery

## 2014-02-28 VITALS — BP 126/80 | HR 76 | Temp 98.7°F | Resp 16 | Ht 64.0 in | Wt 183.0 lb

## 2014-02-28 DIAGNOSIS — K432 Incisional hernia without obstruction or gangrene: Secondary | ICD-10-CM | POA: Insufficient documentation

## 2014-02-28 NOTE — Progress Notes (Signed)
Patient ID: Bradley Graham, male   DOB: [birth year], 43 y.o.   MRN: [id number]  Chief Complaint  Patient presents with  . Incisional Hernia    HPI Bradley Graham is a 43 y.o. male.   HPI This is a pleasant gentleman who is accompanied by his mother. He is referred to me by Dr. Yaakov Guthrie for evaluation of a incisional hernia. The patient was recently lifting weights and noticed discomfort in the upper abdomen and was found to have an incisional hernia. It is difficult to tell how long he has actually had the hernia. He has significant surgery with complications in [id number] and was on the ventilator and in the intensive care area for several months. He recently had foot surgery at [facility]. It was during this rehabilitation when the hernia was noticed. His mother reports that he will have pain now he gets out of bed but she is uncertain whether his role pain or whether he is just saying that because of the knowledge of the hernia. He does have some mental issues. He denies nausea or vomiting. His bowels were moving well Past Medical History  Diagnosis Date  . Club foot of both feet   . Speech impediment   . Tetralogy of Fallot     s/p waterson shunt  . CKD (chronic kidney disease)   . Left ventricular hypertrophy   . Right ventricular hypertrophy   . CHF (congestive heart failure)   . Hypertension   . Atrial fibrillation   . Vitamin D deficiency     Past Surgical History  Procedure Laterality Date  . Vsd repair    . Scoliosis    . Adenoidectomy    . Mouth surgery    . Wrist surgery Left     Family History  Problem Relation Age of Onset  . Diabetes Mother   . Hyperlipidemia Mother   . Diabetes Father   . Hyperlipidemia Father     Social History History  Substance Use Topics  . Smoking status: Never Smoker   . Smokeless tobacco: Not on file  . Alcohol Use: No    Allergies  Allergen Reactions  . Aleve [Naproxen Sodium]   . Bactrim [Sulfamethoxazole-Trimethoprim]   .  Erythromycin   . Heparin     Induced thrombopenia  . Nsaids   . Penicillins     Current Outpatient Prescriptions  Medication Sig Dispense Refill  . aspirin 81 MG tablet Take 81 mg by mouth daily.      . Cholecalciferol (VITAMIN D) 2000 UNITS CAPS Take 1 capsule by mouth daily.      . clindamycin (CLEOCIN) 150 MG capsule       . docusate sodium (COLACE) 100 MG capsule Take 100 mg by mouth daily.      . enalapril (VASOTEC) 5 MG tablet 5 mg 2 (two) times daily.       Marland Kitchen loratadine (CLARITIN) 10 MG tablet Take 10 mg by mouth daily.      Marland Kitchen lovastatin (MEVACOR) 20 MG tablet Take 20 mg by mouth at bedtime.       Marland Kitchen neomycin-colistin-hydrocortisone-thonzonium (CORTISPORIN-TC) 3.02-04-09-0.5 MG/ML otic suspension Place 3 drops into the right ear 4 (four) times daily.  10 mL  0  . pantoprazole (PROTONIX) 40 MG tablet Take 40 mg by mouth daily.        No current facility-administered medications for this visit.    Review of Systems Review of Systems  Constitutional: Negative for fever, chills and  unexpected weight change.  HENT: Negative for congestion, hearing loss, sore throat, trouble swallowing and voice change.   Eyes: Negative for visual disturbance.  Respiratory: Negative for cough and wheezing.   Cardiovascular: Positive for palpitations. Negative for chest pain and leg swelling.  Gastrointestinal: Negative for nausea, vomiting, abdominal pain, diarrhea, constipation, blood in stool, abdominal distention, anal bleeding and rectal pain.  Genitourinary: Negative for hematuria and difficulty urinating.  Musculoskeletal: Positive for back pain, gait problem, joint swelling and neck stiffness. Negative for arthralgias.  Skin: Negative for rash and wound.  Neurological: Negative for seizures, syncope, weakness and headaches.  Hematological: Negative for adenopathy. Does not bruise/bleed easily.  Psychiatric/Behavioral: Negative for confusion.    Blood pressure 126/80, pulse 76, temperature  98.7 F (37.1 C), temperature source Oral, resp. rate 16, height 5\' 4"  (1.626 m), weight 183 lb (83.008 kg).  Physical Exam Physical Exam  Constitutional: He is oriented to person, place, and time. He appears well-developed and well-nourished. No distress.  HENT:  Head: Normocephalic and atraumatic.  Right Ear: External ear normal.  Left Ear: External ear normal.  Nose: Nose normal.  Mouth/Throat: Oropharynx is clear and moist. No oropharyngeal exudate.  Eyes: Conjunctivae are normal. Pupils are equal, round, and reactive to light. Right eye exhibits no discharge. Left eye exhibits no discharge. No scleral icterus.  Neck: Normal range of motion. Neck supple. No tracheal deviation present.  Cardiovascular: Normal rate, regular rhythm and intact distal pulses.   Murmur heard. Pulmonary/Chest: Effort normal and breath sounds normal. No respiratory distress. He has no wheezes.  Abdominal: Soft. Bowel sounds are normal. He exhibits no distension. There is no tenderness. There is no guarding.  Multiple well-healed incisions. There is an incisional hernia in the upper abdomen below the xiphoid as well as an incisional hernia at the umbilicus. Both are easily reducible  Musculoskeletal: He exhibits edema and tenderness.  Lymphadenopathy:    He has no cervical adenopathy.  Neurological: He is alert and oriented to person, place, and time.  Skin: Skin is warm and dry. No rash noted. No pallor.  Psychiatric: His behavior is normal.    Data Reviewed   Assessment    Incisional hernia     Plan    I discussed the diagnosis with the patient and his mother. He certainly has major comorbidities but has done well with his most recent surgery on his feet under general anesthesia. Again, it is difficult to tell given his mental status with her is actually symptomatic from a hernia or not. I discussed hernia repair with the patient and his mother in detail. I discussed laparoscopic repair as well as  use of mesh. I discussed the risk of his comorbidities. I discussed the risk of incarceration should the hernia be followed which I believe is small. The patient's mother is going to go home and think about things for a while and then decide whether or not she wanted to have surgery Versus continue expected management.        Ilianna Bown A 02/28/2014, 10:42 AM

## 2014-04-08 ENCOUNTER — Encounter: Payer: Self-pay | Admitting: Family Medicine

## 2014-04-08 ENCOUNTER — Ambulatory Visit (INDEPENDENT_AMBULATORY_CARE_PROVIDER_SITE_OTHER): Payer: PRIVATE HEALTH INSURANCE | Admitting: Family Medicine

## 2014-04-08 VITALS — BP 164/90 | HR 92 | Temp 98.6°F | Ht 64.0 in | Wt 182.0 lb

## 2014-04-08 DIAGNOSIS — J069 Acute upper respiratory infection, unspecified: Secondary | ICD-10-CM

## 2014-04-08 MED ORDER — METHYLPREDNISOLONE ACETATE 80 MG/ML IJ SUSP
80.0000 mg | Freq: Once | INTRAMUSCULAR | Status: AC
  Administered 2014-04-08: 80 mg via INTRAMUSCULAR

## 2014-04-08 MED ORDER — DOXYCYCLINE HYCLATE 100 MG PO TABS: 100.0000 mg | ORAL_TABLET | Freq: Two times a day (BID) | ORAL | Status: DC

## 2014-04-08 NOTE — Progress Notes (Signed)
   Subjective:    Patient ID: Bradley Graham, male    DOB: 05-06-1971, 43 y.o.   MRN: [id number]  HPI This 43 y.o. male presents for evaluation of uri for over a week.   Review of Systems    No chest pain, SOB, HA, dizziness, vision change, N/V, diarrhea, constipation, dysuria, urinary urgency or frequency, myalgias, arthralgias or rash.  Objective:   Physical Exam Vital signs noted  Well developed well nourished male.  HEENT - Head atraumatic Normocephalic                Eyes - PERRLA, Conjuctiva - clear Sclera- Clear EOMI                Ears - EAC's Wnl TM's Wnl Gross Hearing WNL                Nose - Nares patent                 Throat - oropharanx wnl Respiratory - Lungs CTA bilateral Cardiac - RRR S1 and S2 without murmur GI - Abdomen soft Nontender and bowel sounds active x 4 Extremities - No edema. Neuro - Grossly intact.       Assessment & Plan:  URI (upper respiratory infection) - Plan: doxycycline (VIBRA-TABS) 100 MG tablet, methylPREDNISolone acetate (DEPO-MEDROL) injection 80 mg Push po fluids, rest, tylenol and motrin otc prn as directed for fever, arthralgias, and myalgias.  Follow up prn if sx's continue or persist.  Lysbeth Penner FNP

## 2014-04-29 ENCOUNTER — Ambulatory Visit (INDEPENDENT_AMBULATORY_CARE_PROVIDER_SITE_OTHER): Payer: PRIVATE HEALTH INSURANCE | Admitting: Family Medicine

## 2014-04-29 ENCOUNTER — Encounter: Payer: Self-pay | Admitting: Family Medicine

## 2014-04-29 VITALS — BP 134/82 | HR 104 | Temp 98.1°F | Ht 64.0 in | Wt 179.8 lb

## 2014-04-29 DIAGNOSIS — J069 Acute upper respiratory infection, unspecified: Secondary | ICD-10-CM

## 2014-04-29 MED ORDER — PREDNISONE 10 MG PO TABS: ORAL_TABLET | ORAL | Status: DC

## 2014-04-29 MED ORDER — DOXYCYCLINE HYCLATE 100 MG PO TABS: 100.0000 mg | ORAL_TABLET | Freq: Two times a day (BID) | ORAL | Status: DC

## 2014-04-29 NOTE — Progress Notes (Signed)
   Subjective:    Patient ID: Bradley Graham, male    DOB: 02/27/1971, 43 y.o.   MRN: [id number]  HPI  This 43 y.o. male presents for evaluation of URI sx's persisting.  He has felt better after the Medrol shot but became ill again a few days later.  He is coughing and wheezing now and it has Moved from his head to his chest.  Review of Systems C/o cough and uri sx's   No chest pain, SOB, HA, dizziness, vision change, N/V, diarrhea, constipation, dysuria, urinary urgency or frequency, myalgias, arthralgias or rash.  Objective:   Physical Exam  Vital signs noted  Well developed well nourished male.  HEENT - Head atraumatic Normocephalic                Eyes - PERRLA, Conjuctiva - clear Sclera- Clear EOMI                Ears - EAC's Wnl TM's Wnl Gross Hearing                 Throat - oropharanx wnl Respiratory - Lungs with expiratory wheezes Cardiac - RRR S1 and S2 without murmur GI - Abdomen soft Nontender and bowel sounds active x 4     Assessment & Plan:  URI (upper respiratory infection) - Plan: doxycycline (VIBRA-TABS) 100 MG tablet, predniSONE (DELTASONE) 10 MG tablet  Taper Push po fluids, rest, tylenol and motrin otc prn as directed for fever, arthralgias, and myalgias.  Follow up prn if sx's continue or persist.  Lysbeth Penner FNP

## 2014-07-21 ENCOUNTER — Ambulatory Visit (INDEPENDENT_AMBULATORY_CARE_PROVIDER_SITE_OTHER): Payer: PRIVATE HEALTH INSURANCE | Admitting: Surgery

## 2014-07-21 VITALS — BP 134/84 | HR 99 | Temp 98.3°F | Ht 64.0 in | Wt 184.2 lb

## 2014-07-21 DIAGNOSIS — K432 Incisional hernia without obstruction or gangrene: Secondary | ICD-10-CM

## 2014-07-21 NOTE — Progress Notes (Signed)
Subjective:     Patient ID: Bradley Graham, male   DOB: 11/23/1971, 43 y.o.   MRN: [id number]  HPI He is again accompanied by his mother to discuss whether or not to proceed with hernia repair. I saw him back in March. Again he has a significant history of multiple surgeries and complications requiring long-term care intensive care unit 2007. He has an incisional hernia in the epigastrium up to the xiphoid which is becoming increasingly symptomatic. He mostly has pressure and pain with no obstructive symptoms  Review of Systems     Objective:   Physical Exam On exam, there is a reducible hernia in the epigastrium as well as a small one at the umbilicus    Assessment:     Incisional hernia     Plan:     I again had a long discussion with his mother who makes all decisions for him. Again his surgery is fairly high risk given the fact that I may have to convert to an open procedure because of his previous jejunostomy tube. She is wanting about this and discuss this with her husband further prior to consenting to surgery. She will call us back and let us know

## 2014-07-21 NOTE — Patient Instructions (Signed)
Call us back and let us know whether you have decided on surgery

## 2014-10-20 DIAGNOSIS — E663 Overweight: Secondary | ICD-10-CM | POA: Insufficient documentation

## 2014-10-20 DIAGNOSIS — E66811 Obesity, class 1: Secondary | ICD-10-CM | POA: Insufficient documentation

## 2014-11-03 ENCOUNTER — Encounter: Payer: Self-pay | Admitting: Family Medicine

## 2014-11-03 ENCOUNTER — Ambulatory Visit (INDEPENDENT_AMBULATORY_CARE_PROVIDER_SITE_OTHER): Payer: PRIVATE HEALTH INSURANCE | Admitting: Family Medicine

## 2014-11-03 VITALS — BP 139/86 | HR 109 | Temp 98.7°F | Ht 64.0 in | Wt 177.0 lb

## 2014-11-03 DIAGNOSIS — J206 Acute bronchitis due to rhinovirus: Secondary | ICD-10-CM

## 2014-11-03 DIAGNOSIS — H60391 Other infective otitis externa, right ear: Secondary | ICD-10-CM

## 2014-11-03 MED ORDER — BENZONATATE 100 MG PO CAPS: 100.0000 mg | ORAL_CAPSULE | Freq: Three times a day (TID) | ORAL | Status: DC | PRN

## 2014-11-03 MED ORDER — CIPROFLOXACIN HCL 500 MG PO TABS: 500.0000 mg | ORAL_TABLET | Freq: Two times a day (BID) | ORAL | Status: DC

## 2014-11-03 MED ORDER — CIPROFLOXACIN-HYDROCORTISONE 0.2-1 % OT SUSP: 3.0000 [drp] | Freq: Two times a day (BID) | OTIC | Status: DC

## 2014-11-03 MED ORDER — METHYLPREDNISOLONE ACETATE 80 MG/ML IJ SUSP
80.0000 mg | Freq: Once | INTRAMUSCULAR | Status: AC
  Administered 2014-11-03: 80 mg via INTRAMUSCULAR

## 2014-11-03 NOTE — Progress Notes (Signed)
   Subjective:    Patient ID: Bradley Graham, male    DOB: 01-25-1971, 43 y.o.   MRN: [id number]  HPI Patient is here for c/o uri sx's and right ear discharge.  Review of Systems  Constitutional: Negative for fever.  HENT: Negative for ear pain.   Eyes: Negative for discharge.  Respiratory: Negative for cough.   Cardiovascular: Negative for chest pain.  Gastrointestinal: Negative for abdominal distention.  Endocrine: Negative for polyuria.  Genitourinary: Negative for difficulty urinating.  Musculoskeletal: Negative for gait problem and neck pain.  Skin: Negative for color change and rash.  Neurological: Negative for speech difficulty and headaches.  Psychiatric/Behavioral: Negative for agitation.       Objective:    BP 139/86 mmHg  Pulse 109  Temp(Src) 98.7 F (37.1 C) (Oral)  Ht 5\' 4"  (1.626 m)  Wt 177 lb (80.287 kg)  BMI 30.37 kg/m2 Physical Exam  Constitutional: He is oriented to person, place, and time. He appears well-developed and well-nourished.  HENT:  Head: Normocephalic and atraumatic.  Mouth/Throat: Oropharynx is clear and moist.  Right EAC with white drainage  Eyes: Pupils are equal, round, and reactive to light.  Neck: Normal range of motion. Neck supple.  Cardiovascular: Normal rate and regular rhythm.   No murmur heard. Pulmonary/Chest: Effort normal and breath sounds normal.  Abdominal: Soft. Bowel sounds are normal. There is no tenderness.  Neurological: He is alert and oriented to person, place, and time.  Skin: Skin is warm and dry.  Psychiatric: He has a normal mood and affect.          Assessment & Plan:     ICD-9-CM ICD-10-CM   1. Otitis, externa, infective, right 380.10 H60.391 methylPREDNISolone acetate (DEPO-MEDROL) injection 80 mg     ciprofloxacin-hydrocortisone (CIPRO HC) otic suspension     ciprofloxacin (CIPRO) 500 MG tablet  2. Acute bronchitis due to Rhinovirus 466.0 J20.6 methylPREDNISolone acetate (DEPO-MEDROL) injection 80  mg   079.3  ciprofloxacin (CIPRO) 500 MG tablet     benzonatate (TESSALON PERLES) 100 MG capsule   Push po fluids, rest, tylenol and motrin otc prn as directed for fever, arthralgias, and myalgias.  Follow up prn if sx's continue or persist.  Return if symptoms worsen or fail to improve.  Lysbeth Penner FNP

## 2014-11-03 NOTE — Addendum Note (Signed)
Addended by: Marin Olp on: 11/03/2014 05:56 PM   Modules accepted: Miquel Dunn

## 2014-11-10 ENCOUNTER — Ambulatory Visit (INDEPENDENT_AMBULATORY_CARE_PROVIDER_SITE_OTHER): Payer: PRIVATE HEALTH INSURANCE | Admitting: Family Medicine

## 2014-11-10 VITALS — BP 139/77 | HR 103 | Temp 97.1°F | Ht 64.0 in | Wt 176.0 lb

## 2014-11-10 DIAGNOSIS — H60391 Other infective otitis externa, right ear: Secondary | ICD-10-CM

## 2014-11-10 DIAGNOSIS — J206 Acute bronchitis due to rhinovirus: Secondary | ICD-10-CM

## 2014-11-10 MED ORDER — CIPROFLOXACIN HCL 500 MG PO TABS: 500.0000 mg | ORAL_TABLET | Freq: Two times a day (BID) | ORAL | Status: DC

## 2014-11-10 MED ORDER — BENZONATATE 100 MG PO CAPS: 200.0000 mg | ORAL_CAPSULE | Freq: Three times a day (TID) | ORAL | Status: DC | PRN

## 2014-11-10 NOTE — Progress Notes (Signed)
   Subjective:    Patient ID: Bradley Graham, male    DOB: 08/03/1971, 43 y.o.   MRN: [id number]  HPI Patient is here for c/o URI sx's and continued right ear drainage.  Review of Systems  Constitutional: Negative for fever.  HENT: Negative for ear pain.   Eyes: Negative for discharge.  Respiratory: Negative for cough.   Cardiovascular: Negative for chest pain.  Gastrointestinal: Negative for abdominal distention.  Endocrine: Negative for polyuria.  Genitourinary: Negative for difficulty urinating.  Musculoskeletal: Negative for gait problem and neck pain.  Skin: Negative for color change and rash.  Neurological: Negative for speech difficulty and headaches.  Psychiatric/Behavioral: Negative for agitation.       Objective:    BP 139/77 mmHg  Pulse 103  Temp(Src) 97.1 F (36.2 C) (Oral)  Ht 5\' 4"  (1.626 m)  Wt 176 lb (79.833 kg)  BMI 30.20 kg/m2 Physical Exam  Constitutional: He is oriented to person, place, and time. He appears well-developed and well-nourished.  HENT:  Head: Normocephalic and atraumatic.  Mouth/Throat: Oropharynx is clear and moist.  Eyes: Pupils are equal, round, and reactive to light.  Neck: Normal range of motion. Neck supple.  Cardiovascular: Normal rate and regular rhythm.   No murmur heard. Pulmonary/Chest: Effort normal and breath sounds normal.  Abdominal: Soft. Bowel sounds are normal. There is no tenderness.  Neurological: He is alert and oriented to person, place, and time.  Skin: Skin is warm and dry.  Psychiatric: He has a normal mood and affect.          Assessment & Plan:     ICD-9-CM ICD-10-CM   1. Otitis, externa, infective, right 380.10 H60.391 ciprofloxacin (CIPRO) 500 MG tablet  2. Acute bronchitis due to Rhinovirus 466.0 J20.6 ciprofloxacin (CIPRO) 500 MG tablet   079.3  benzonatate (TESSALON PERLES) 100 MG capsule     No Follow-up on file.  Lysbeth Penner FNP

## 2014-12-09 ENCOUNTER — Telehealth: Payer: Self-pay | Admitting: Family Medicine

## 2014-12-10 NOTE — Telephone Encounter (Signed)
Patient has appointment for tomorrow

## 2014-12-10 NOTE — Telephone Encounter (Signed)
NTBS.

## 2014-12-11 ENCOUNTER — Ambulatory Visit (INDEPENDENT_AMBULATORY_CARE_PROVIDER_SITE_OTHER): Payer: Medicare Other | Admitting: Family Medicine

## 2014-12-11 VITALS — BP 144/86 | HR 100 | Temp 97.5°F | Ht 64.0 in | Wt 173.2 lb

## 2014-12-11 DIAGNOSIS — J206 Acute bronchitis due to rhinovirus: Secondary | ICD-10-CM

## 2014-12-11 DIAGNOSIS — M79604 Pain in right leg: Secondary | ICD-10-CM

## 2014-12-11 MED ORDER — METHYLPREDNISOLONE (PAK) 4 MG PO TABS: ORAL_TABLET | ORAL | Status: DC

## 2014-12-11 MED ORDER — HYDROCODONE-HOMATROPINE 5-1.5 MG/5ML PO SYRP: 5.0000 mL | ORAL_SOLUTION | Freq: Three times a day (TID) | ORAL | Status: DC | PRN

## 2014-12-11 MED ORDER — AZITHROMYCIN 250 MG PO TABS: ORAL_TABLET | ORAL | Status: DC

## 2014-12-11 NOTE — Progress Notes (Signed)
   Subjective:    Patient ID: Bradley Graham, male    DOB: July 30, 1971, 44 y.o.   MRN: [id number]  HPI Patient c/o right foot pain and has hx of right ankle surgery and his mother who accompanies him states he has seen PT in the past and this has helped.  He has been having continued cough and wheezing.  Review of Systems  Constitutional: Negative for fever.  HENT: Negative for ear pain.   Eyes: Negative for discharge.  Respiratory: Positive for cough and wheezing.   Cardiovascular: Negative for chest pain.  Gastrointestinal: Negative for abdominal distention.  Endocrine: Negative for polyuria.  Genitourinary: Negative for difficulty urinating.  Musculoskeletal: Positive for arthralgias. Negative for gait problem and neck pain.  Skin: Negative for color change and rash.  Neurological: Negative for speech difficulty and headaches.  Psychiatric/Behavioral: Negative for agitation.       Objective:    BP 144/86 mmHg  Pulse 100  Temp(Src) 97.5 F (36.4 C) (Oral)  Ht 5\' 4"  (1.626 m)  Wt 173 lb 3.2 oz (78.563 kg)  BMI 29.72 kg/m2 Physical Exam  Constitutional: He is oriented to person, place, and time. He appears well-developed and well-nourished.  HENT:  Head: Normocephalic and atraumatic.  Mouth/Throat: Oropharynx is clear and moist.  Eyes: Pupils are equal, round, and reactive to light.  Neck: Normal range of motion. Neck supple.  Cardiovascular: Normal rate and regular rhythm.   No murmur heard. Pulmonary/Chest: Effort normal. He has wheezes.  Abdominal: Soft. Bowel sounds are normal. There is no tenderness.  Neurological: He is alert and oriented to person, place, and time.  Skin: Skin is warm and dry.  Psychiatric: He has a normal mood and affect.          Assessment & Plan:     ICD-9-CM ICD-10-CM   1. Acute bronchitis due to Rhinovirus 466.0 J20.6 methylPREDNIsolone (MEDROL DOSPACK) 4 MG tablet   079.3  azithromycin (ZITHROMAX) 250 MG tablet   HYDROcodone-homatropine (HYCODAN) 5-1.5 MG/5ML syrup  2. Pain of right lower extremity 729.5 M79.604 Ambulatory referral to Physical Therapy   Push po fluids, rest, tylenol and motrin otc prn as directed for fever, arthralgias, and myalgias.  Follow up prn if sx's continue or persist.  Return if symptoms worsen or fail to improve.  Lysbeth Penner FNP

## 2014-12-22 ENCOUNTER — Ambulatory Visit (INDEPENDENT_AMBULATORY_CARE_PROVIDER_SITE_OTHER): Payer: Medicare Other

## 2014-12-22 ENCOUNTER — Ambulatory Visit (INDEPENDENT_AMBULATORY_CARE_PROVIDER_SITE_OTHER): Payer: Medicare Other | Admitting: Family Medicine

## 2014-12-22 ENCOUNTER — Encounter: Payer: Self-pay | Admitting: Family Medicine

## 2014-12-22 VITALS — BP 132/85 | HR 105 | Temp 98.1°F | Ht 64.0 in | Wt 169.0 lb

## 2014-12-22 DIAGNOSIS — N189 Chronic kidney disease, unspecified: Secondary | ICD-10-CM | POA: Diagnosis not present

## 2014-12-22 DIAGNOSIS — R059 Cough, unspecified: Secondary | ICD-10-CM

## 2014-12-22 DIAGNOSIS — R05 Cough: Secondary | ICD-10-CM | POA: Diagnosis not present

## 2014-12-22 DIAGNOSIS — H6123 Impacted cerumen, bilateral: Secondary | ICD-10-CM

## 2014-12-22 DIAGNOSIS — J206 Acute bronchitis due to rhinovirus: Secondary | ICD-10-CM

## 2014-12-22 LAB — POCT CBC
Granulocyte percent: 76.8 %G (ref 37–80)
HEMATOCRIT: 45.7 % (ref 43.5–53.7)
Hemoglobin: 14.1 g/dL (ref 14.1–18.1)
Lymph, poc: 1.9 (ref 0.6–3.4)
MCH: 28.8 pg (ref 27–31.2)
MCHC: 30.9 g/dL — AB (ref 31.8–35.4)
MCV: 93.2 fL (ref 80–97)
MPV: 8.2 fL (ref 0–99.8)
POC GRANULOCYTE: 6.9 (ref 2–6.9)
POC LYMPH PERCENT: 20.7 %L (ref 10–50)
Platelet Count, POC: 190 10*3/uL (ref 142–424)
RBC: 4.9 M/uL (ref 4.69–6.13)
RDW, POC: 14.1 %
WBC: 9 10*3/uL (ref 4.6–10.2)

## 2014-12-22 MED ORDER — HYDROCODONE-HOMATROPINE 5-1.5 MG/5ML PO SYRP: 5.0000 mL | ORAL_SOLUTION | Freq: Three times a day (TID) | ORAL | Status: DC | PRN

## 2014-12-22 NOTE — Progress Notes (Addendum)
 Subjective:    Patient ID: Bradley Graham, male    DOB: 08/27/1971, 44 y.o.   MRN: [id number]  HPI Patient here today for another visit regarding his cough and congestion. He has, in the last 4-5 weeks been on 3 antibiotics, 1 pred-pack, 2 rounds of tessalon perles, and 1 bottle of Hycodan syrup. He is accompanied today by his mother. The patient continues to have congestion and cough. He will get a chest x-ray today. He has been on 3 different antibiotics and prednisone pack Tessalon Perles and Hycodan. He is not running any fever today. His pulse is 105. He does have a history of chronic kidney disease.        Patient Active Problem List   Diagnosis Date Noted  . Incisional hernia, without obstruction or gangrene 02/28/2014  . 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 Vitamin D deficiency 06/25/2013  . CKD (chronic kidney disease) 06/25/2013  . S/P TOF (tetralogy of Fallot) repair 06/25/2013  . Speech impediment secondary to organic lesion 06/25/2013   Outpatient Encounter Prescriptions as of 12/22/2014  Medication Sig  . aspirin 81 MG tablet Take 81 mg by mouth daily.  . Cholecalciferol (VITAMIN D) 2000 UNITS CAPS Take 1 capsule by mouth daily.  . docusate sodium (COLACE) 100 MG capsule Take 100 mg by mouth daily.  . enalapril (VASOTEC) 5 MG tablet 5 mg 2 (two) times daily.   . loratadine (CLARITIN) 10 MG tablet Take 10 mg by mouth daily.  . lovastatin (MEVACOR) 20 MG tablet Take 20 mg by mouth at bedtime.   . pantoprazole (PROTONIX) 40 MG tablet Take 40 mg by mouth daily.   . [DISCONTINUED] azithromycin (ZITHROMAX) 250 MG tablet Take 2 po first day and then one po qd x 4 days  . [DISCONTINUED] ciprofloxacin-hydrocortisone (CIPRO HC) otic suspension Place 3 drops into the right ear 2 (two) times daily.  . [DISCONTINUED] HYDROcodone-homatropine (HYCODAN) 5-1.5 MG/5ML syrup Take 5 mLs by mouth every 8 (eight) hours as needed for cough.  . [DISCONTINUED]  methylPREDNIsolone (MEDROL DOSPACK) 4 MG tablet follow package directions    Review of Systems  Constitutional: Negative.   HENT: Positive for congestion.   Eyes: Negative.   Respiratory: Positive for cough.   Cardiovascular: Negative.   Gastrointestinal: Negative.   Endocrine: Negative.   Genitourinary: Negative.   Musculoskeletal: Negative.   Skin: Negative.   Allergic/Immunologic: Negative.   Neurological: Negative.   Hematological: Negative.   Psychiatric/Behavioral: Negative.        Objective:   Physical Exam  Constitutional: He is oriented to person, place, and time. No distress.  The patient is alert and responsive and is kyphotic in appearance  HENT:  Head: Normocephalic and atraumatic.  Nose: Nose normal.  Mouth/Throat: Oropharynx is clear and moist. No oropharyngeal exudate.  Bilateral ears cerumen and nasal congestion bilaterally.  Eyes: Conjunctivae and EOM are normal. Pupils are equal, round, and reactive to light. Right eye exhibits no discharge. Left eye exhibits no discharge. No scleral icterus.  Neck: Normal range of motion. Neck supple. No thyromegaly present.  No anterior cervical adenopathy  Cardiovascular: Normal rate, regular rhythm and normal heart sounds.   No murmur heard. The rhythm was regular at 96/m  Pulmonary/Chest: Effort normal and breath sounds normal. No respiratory distress. He has no wheezes. He has no rales. He exhibits no tenderness.  Were no rales or wheezes but there was some bronchial irritation with coughing.  Abdominal: Bowel sounds are normal.   He exhibits no mass.  Genitourinary: Rectum normal, prostate normal and penis normal.  Musculoskeletal: Normal range of motion. He exhibits no edema.  Lymphadenopathy:    He has no cervical adenopathy.  Neurological: He is alert and oriented to person, place, and time.  Skin: Skin is warm and dry. No rash noted.  Psychiatric: He has a normal mood and affect. His behavior is normal.  Judgment and thought content normal.  Nursing note and vitals reviewed.  BP 132/85 mmHg  Pulse 105  Temp(Src) 98.1 F (36.7 C) (Oral)  Ht 5' 4" (1.626 m)  Wt 169 lb (76.658 kg)  BMI 28.99 kg/m2  [facility] reading (PRIMARY) by  Dr. Moore-chest x-ray-no acute change from previously but patient's posture makes it difficult to read this film                                  Results for orders placed or performed in visit on 12/22/14  POCT CBC  Result Value Ref Range   WBC 9.0 4.6 - 10.2 K/uL   Lymph, poc 1.9 0.6 - 3.4   POC LYMPH PERCENT 20.7 10 - 50 %L   MID (cbc)  0 - 0.9   POC MID %  0 - 12 %M   POC Granulocyte 6.9 2 - 6.9   Granulocyte percent 76.8 37 - 80 %G   RBC 4.9 4.69 - 6.13 M/uL   Hemoglobin 14.1 14.1 - 18.1 g/dL   HCT, POC 45.7 43.5 - 53.7 %   MCV 93.2 80 - 97 fL   MCH, POC 28.8 27 - 31.2 pg   MCHC 30.9 (A) 31.8 - 35.4 g/dL   RDW, POC 14.1 %   Platelet Count, POC 190.0 142 - 424 K/uL   MPV 8.2 0 - 99.8 fL         Assessment & Plan:  1. Cough - POCT CBC - DG Chest 2 View; Future - BMP8+EGFR - HYDROcodone-homatropine (HYCODAN) 5-1.5 MG/5ML syrup; Take 5 mLs by mouth every 8 (eight) hours as needed for cough.  Dispense: 120 mL; Refill: 0 Use cool mist humidifier in the bedroom at night Take Mucinex maximum strength, blue and white in color, 1 twice daily with a large glass of water If coughing continues patient may need to be referred to pulmonology at [facility].  2. Acute bronchitis due to Rhinovirus -Continue above directions for humidification and fluids and cough medicine - HYDROcodone-homatropine (HYCODAN) 5-1.5 MG/5ML syrup; Take 5 mLs by mouth every 8 (eight) hours as needed for cough.  Dispense: 120 mL; Refill: 0  3. CKD (chronic kidney disease), unspecified stage -This will be monitored with blood work drawn today.  4. Impacted cerumen of both ears De Brox eardrops for ear wax softening  Patient Instructions  Continue to drink plenty of  fluids Use a cool mist humidifier in your bedroom at nighttime Change cough medicine to Mucinex, blue and white in color, plain, 1 twice daily with a large glass of water Take take Hycodan cough syrup at bedtime as needed for severe cough When the chest x-ray is returned, depending on the results we may need to make a referral to a pulmonologist to further evaluate his reason for continuing to cough. Use saline nose spray in each nostril several times daily Use the Debrox eardrops softener as directed in each ear canal   Don W. Moore MD   .  

## 2014-12-22 NOTE — Patient Instructions (Addendum)
Continue to drink plenty of fluids Use a cool mist humidifier in your bedroom at nighttime Change cough medicine to Mucinex, blue and white in color, plain, 1 twice daily with a large glass of water Take take Hycodan cough syrup at bedtime as needed for severe cough When the chest x-ray is returned, depending on the results we may need to make a referral to a pulmonologist to further evaluate his reason for continuing to cough. Use saline nose spray in each nostril several times daily Use the Debrox eardrops softener as directed in each ear canal

## 2014-12-23 ENCOUNTER — Ambulatory Visit: Payer: Medicaid Other | Admitting: Physical Therapy

## 2014-12-23 ENCOUNTER — Other Ambulatory Visit: Payer: Self-pay

## 2014-12-23 DIAGNOSIS — M79605 Pain in left leg: Secondary | ICD-10-CM

## 2014-12-23 LAB — BMP8+EGFR
BUN/Creatinine Ratio: 15 (ref 9–20)
BUN: 29 mg/dL — ABNORMAL HIGH (ref 6–24)
CO2: 23 mmol/L (ref 18–29)
Calcium: 9.7 mg/dL (ref 8.7–10.2)
Chloride: 101 mmol/L (ref 97–108)
Creatinine, Ser: 1.89 mg/dL — ABNORMAL HIGH (ref 0.76–1.27)
GFR calc Af Amer: 49 mL/min/{1.73_m2} — ABNORMAL LOW (ref >59)
GFR calc non Af Amer: 43 mL/min/{1.73_m2} — ABNORMAL LOW (ref >59)
Glucose: 112 mg/dL — ABNORMAL HIGH (ref 65–99)
Potassium: 5.2 mmol/L (ref 3.5–5.2)
Sodium: 143 mmol/L (ref 134–144)

## 2014-12-23 NOTE — Addendum Note (Signed)
Addended by: Zannie Cove on: 12/23/2014 11:26 AM   Modules accepted: Orders

## 2015-01-06 ENCOUNTER — Ambulatory Visit: Payer: Medicare Other | Attending: Family Medicine | Admitting: Physical Therapy

## 2015-01-06 DIAGNOSIS — Q661 Congenital talipes calcaneovarus: Secondary | ICD-10-CM | POA: Diagnosis not present

## 2015-01-06 DIAGNOSIS — M79672 Pain in left foot: Secondary | ICD-10-CM | POA: Diagnosis not present

## 2015-01-09 ENCOUNTER — Ambulatory Visit: Payer: Medicare Other | Admitting: Physical Therapy

## 2015-01-09 DIAGNOSIS — M79672 Pain in left foot: Secondary | ICD-10-CM | POA: Diagnosis not present

## 2015-01-09 DIAGNOSIS — Q661 Congenital talipes calcaneovarus: Secondary | ICD-10-CM | POA: Diagnosis not present

## 2015-01-14 ENCOUNTER — Ambulatory Visit: Payer: Medicare Other | Admitting: Physical Therapy

## 2015-01-14 DIAGNOSIS — Q661 Congenital talipes calcaneovarus: Secondary | ICD-10-CM | POA: Diagnosis not present

## 2015-01-14 DIAGNOSIS — M79672 Pain in left foot: Secondary | ICD-10-CM | POA: Diagnosis not present

## 2015-01-19 ENCOUNTER — Encounter: Payer: Medicare Other | Admitting: Physical Therapy

## 2015-01-27 ENCOUNTER — Ambulatory Visit: Payer: Medicare Other | Admitting: Physical Therapy

## 2015-01-27 ENCOUNTER — Encounter: Payer: Self-pay | Admitting: Physical Therapy

## 2015-01-27 DIAGNOSIS — M79672 Pain in left foot: Secondary | ICD-10-CM

## 2015-01-27 DIAGNOSIS — Q661 Congenital talipes calcaneovarus: Secondary | ICD-10-CM | POA: Diagnosis not present

## 2015-01-27 NOTE — Therapy (Signed)
[facility] [place], [place], [address] Phone: [phone]   Fax:  [phone]  Physical Therapy Treatment  Patient Details  Name: Bradley Graham MRN: [id number] Date of Birth: 24-Feb-1971 Referring Provider:  Chipper Herb, MD  Encounter Date: 01/27/2015      PT End of Session - 01/27/15 1335    Visit Number 4   Number of Visits 12   Date for PT Re-Evaluation 02/18/15   PT Start Time 3329   PT Stop Time 5188   PT Time Calculation (min) 46 min      Past Medical History  Diagnosis Date  . Club foot of both feet   . Speech impediment   . Tetralogy of Fallot     s/p waterson shunt  . CKD (chronic kidney disease)   . Left ventricular hypertrophy   . Right ventricular hypertrophy   . CHF (congestive heart failure)   . Hypertension   . Atrial fibrillation   . Vitamin D deficiency     Past Surgical History  Procedure Laterality Date  . Vsd repair    . Scoliosis    . Adenoidectomy    . Mouth surgery    . Wrist surgery Left     There were no vitals taken for this visit.  Visit Diagnosis:  Left foot pain      Subjective Assessment - 01/27/15 1318    Symptoms feeling better with 0 pain reported in two days, and pain at that time was only 1-2/10   Multiple Pain Sites No          [facility] PT Assessment - 01/27/15 0001    ROM / Strength   AROM / PROM / Strength AROM;Strength   AROM   Overall AROM  Deficits   Overall AROM Comments --  initial 2 degrees (01/06/15)   AROM Assessment Site Ankle   Right/Left Ankle Left   Left Ankle Dorsiflexion 3   Strength   Overall Strength Deficits   Overall Strength Comments initial inv/ever 3+/5 (01/06/15)   Strength Assessment Site Ankle   Right/Left Ankle Left   Left Ankle Inversion 3+/5   Left Ankle Eversion 3+/5                  [facility] Adult PT Treatment/Exercise - 01/27/15 0001    Exercises   Exercises Ankle   Modalities   Modalities Moist  Heat;Electrical Stimulation   Moist Heat Therapy   Number Minutes Moist Heat 15 Minutes   Moist Heat Location --  foot/ankle after stretching   Electrical Stimulation   Electrical Stimulation Location foot/ankle   Electrical Stimulation Action premod   Electrical Stimulation Goals --  muscle soreness   Manual Therapy   Manual Therapy Passive ROM   Passive ROM Low load stretch for DF, inv,ever   Ankle Exercises: Standing   Rocker Board 3 minutes  for DF stretching   Ankle Exercises: Supine   T-Band Inv/ever with RED t-band 2x 10 each   Ankle Exercises: Sidelying   Ankle Inversion Weights (lbs) ankle isolator 1 # 2 x 10   Ankle Eversion Weights (lbs) ankle isolator 1# 2 x 10                PT Education - 01/27/15 1317    Education provided Yes   Education Details HEP ROM/Strength   Person(s) Educated Patient   Methods Explanation;Demonstration;Handout   Comprehension Verbalized understanding;Returned demonstration  PT Long Term Goals - 01/27/15 1348    PT LONG TERM GOAL #1   Title demonstrate and/or verbalize techniques to reduce the risk of re-injury to include info on: anti-inflammatory (RICE Method)   Time 6   Period Weeks   Status Achieved  01/14/15   PT LONG TERM GOAL #2   Title be independent with advanced HEP   Time 6   Period Weeks   Status On-going   PT LONG TERM GOAL #3   Title perform ADL's with pain not > 3/10   Time 6   Period Weeks   Status Achieved   PT LONG TERM GOAL #4   Title increase Right ankle strength to 4+/5 to provide good stability while walking   Time 6   Period Weeks   Status On-going   PT LONG TERM GOAL #5   Title increase left ankle active DF to 6 degrees   Time 6   Period Weeks   Status On-going               Plan - 01/27/15 1339    Clinical Impression Statement pt tolerated tx well with no pain reports. Has improved with AROM for DF by one degree and able to perfom ADL's with no pain, no  improvement with strength. HEP given for ROM/Strength. Met LTG # 3 others ongoing.   PT Treatment/Interventions ADLs/Self Care Home Management;Moist Heat;Therapeutic activities;Patient/family education;Therapeutic exercise;Passive range of motion;Ultrasound;Gait training;Manual techniques;Cryotherapy;Electrical Stimulation   PT Next Visit Plan cont with POC for ROM/Strength, pt going to MD for possible hernia surgury         Problem List Patient Active Problem List   Diagnosis Date Noted  . Incisional hernia, without obstruction or gangrene 02/28/2014  . 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 Vitamin D deficiency 06/25/2013  . CKD (chronic kidney disease) 06/25/2013  . S/P TOF (tetralogy of Fallot) repair 06/25/2013  . Speech impediment secondary to organic lesion 06/25/2013    Phillips Climes, PTA 01/27/2015, 1:54 PM  [facility] [address] [place], [place], [address] Phone: [phone]   Fax:  [phone]

## 2015-01-27 NOTE — Patient Instructions (Signed)
Inversion: Resisted   Cross legs with right leg underneath, foot in tubing loop. Hold tubing around other foot to resist and turn foot in. Repeat _10-20___ times per set. Do _2___ sets per session. Do _2___ sessions per day.  http://orth.exer.us/12   Copyright  VHI. All rights reserved.  Eversion: Resisted   With right foot in tubing loop, hold tubing around other foot to resist and turn foot out. Repeat __10-20__ times per set. Do _2___ sets per session. Do __2__ sessions per day.  http://orth.exer.us/14   Copyright  VHI. All rights reserved.  ROM: Plantar / Dorsiflexion   With left leg relaxed, gently flex and extend ankle and may use a towel to assist with strech. Move through full range of motion. Avoid pain. Repeat __10__ times per set. Do __2_ sets per session. Do __2__ sessions per day.  http://orth.exer.us/34   Copyright  VHI. All rights reserved.

## 2015-02-04 DIAGNOSIS — K432 Incisional hernia without obstruction or gangrene: Secondary | ICD-10-CM | POA: Diagnosis not present

## 2015-02-06 ENCOUNTER — Ambulatory Visit: Payer: Medicare Other | Attending: Family Medicine | Admitting: *Deleted

## 2015-02-06 ENCOUNTER — Encounter: Payer: Self-pay | Admitting: *Deleted

## 2015-02-06 DIAGNOSIS — M79672 Pain in left foot: Secondary | ICD-10-CM | POA: Diagnosis not present

## 2015-02-06 DIAGNOSIS — Q661 Congenital talipes calcaneovarus: Secondary | ICD-10-CM | POA: Insufficient documentation

## 2015-02-06 NOTE — Therapy (Signed)
[facility] [place], [place], [address] Phone: [phone]   Fax:  [phone]  Physical Therapy Treatment  Patient Details  Name: Bradley Graham MRN: [id number] Date of Birth: [birth year] Referring Provider:  Chipper Herb, MD  Encounter Date: 02/06/2015      PT End of Session - 02/06/15 0857    Visit Number 5   Number of Visits 12   Date for PT Re-Evaluation 02/18/15   PT Start Time 0805      Past Medical History  Diagnosis Date  . Club foot of both feet   . Speech impediment   . Tetralogy of Fallot     s/p waterson shunt  . CKD (chronic kidney disease)   . Left ventricular hypertrophy   . Right ventricular hypertrophy   . CHF (congestive heart failure)   . Hypertension   . Atrial fibrillation   . Vitamin D deficiency     Past Surgical History  Procedure Laterality Date  . Vsd repair    . Scoliosis    . Adenoidectomy    . Mouth surgery    . Wrist surgery Left     There were no vitals taken for this visit.  Visit Diagnosis:  Left foot pain      Subjective Assessment - 02/06/15 0802    Symptoms Had increased pain the last few days. put ice on it last night and it helped   Currently in Pain? Yes   Pain Score 3    Pain Location Foot   Pain Orientation Left   Pain Descriptors / Indicators Aching   Aggravating Factors  walking   Pain Relieving Factors ice,heat,e-stim          [facility] PT Assessment - 02/06/15 0001    ROM / Strength   AROM / PROM / Strength AROM   AROM   Overall AROM  Deficits   AROM Assessment Site Ankle   Right/Left Ankle Left   Left Ankle Dorsiflexion 5                  [facility] Adult PT Treatment/Exercise - 02/06/15 0001    Modalities   Modalities Cryotherapy   Moist Heat Therapy   Number Minutes Moist Heat 15 Minutes   Moist Heat Location --  LT foot   Cryotherapy   Number Minutes Cryotherapy 15 Minutes   Cryotherapy Location Ankle   Type of Cryotherapy  Ice pack  Vasopnuematic   Electrical Stimulation   Electrical Stimulation Location foot/ankle   Electrical Stimulation Action premod   Electrical Stimulation Parameters 15 min   Manual Therapy   Manual Therapy Passive ROM   Passive ROM Low load stretch for DF, inv,ever and STW to Plantar fascia IASTM                     PT Long Term Goals - 01/27/15 1348    PT LONG TERM GOAL #1   Title demonstrate and/or verbalize techniques to reduce the risk of re-injury to include info on: anti-inflammatory (RICE Method)   Time 6   Period Weeks   Status Achieved  01/14/15   PT LONG TERM GOAL #2   Title be independent with advanced HEP   Time 6   Period Weeks   Status On-going   PT LONG TERM GOAL #3   Title perform ADL's with pain not > 3/10   Time 6   Period Weeks   Status Achieved   PT  LONG TERM GOAL #4   Title increase Right ankle strength to 4+/5 to provide good stability while walking   Time 6   Period Weeks   Status On-going   PT LONG TERM GOAL #5   Title increase left ankle active DF to 6 degrees   Time 6   Period Weeks   Status On-going               Plan - 02/06/15 [id number]    Clinical Impression Statement pt did well with todays Rx, but had a little more pain.    PT Next Visit Plan cont with POC for ROM/Strength, pt going to MD for possible hernia surgury, and will check about getting new orthotics made. Info given   Consulted and Agree with Plan of Care Patient        Problem List Patient Active Problem List   Diagnosis Date Noted  . Incisional hernia, without obstruction or gangrene 02/28/2014  . 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 Vitamin D deficiency 06/25/2013  . CKD (chronic kidney disease) 06/25/2013  . S/P TOF (tetralogy of Fallot) repair 06/25/2013  . 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

## 2015-02-09 ENCOUNTER — Telehealth: Payer: Self-pay | Admitting: Family Medicine

## 2015-02-09 DIAGNOSIS — Q6602 Congenital talipes equinovarus, left foot: Secondary | ICD-10-CM

## 2015-02-09 DIAGNOSIS — Q6601 Congenital talipes equinovarus, right foot: Secondary | ICD-10-CM

## 2015-02-09 DIAGNOSIS — M79672 Pain in left foot: Secondary | ICD-10-CM

## 2015-02-09 DIAGNOSIS — Q6689 Other  specified congenital deformities of feet: Secondary | ICD-10-CM

## 2015-02-09 NOTE — Telephone Encounter (Signed)
Please make referral to Dr. Oneida Alar

## 2015-02-09 NOTE — Telephone Encounter (Signed)
This referral to Dr. Oneida Alar--- is apparently for left foot pain.----Please check with physical therapy before making a referral for more specific findings.

## 2015-02-10 NOTE — Telephone Encounter (Signed)
Patient mother aware that referral has been started and we will call her with an appointment.

## 2015-02-12 ENCOUNTER — Ambulatory Visit: Payer: Medicare Other | Admitting: *Deleted

## 2015-02-12 ENCOUNTER — Encounter: Payer: Self-pay | Admitting: *Deleted

## 2015-02-12 DIAGNOSIS — M79672 Pain in left foot: Secondary | ICD-10-CM | POA: Diagnosis not present

## 2015-02-12 DIAGNOSIS — Q661 Congenital talipes calcaneovarus: Secondary | ICD-10-CM | POA: Diagnosis not present

## 2015-02-12 NOTE — Therapy (Signed)
[facility] [place], [place], [address] Phone: [phone]   Fax:  [phone]  Physical Therapy Treatment  Patient Details  Name: Bradley Graham MRN: [id number] Date of Birth: Sep 24, 1971 Referring Provider:  Chipper Herb, MD  Encounter Date: 02/12/2015      PT End of Session - 02/12/15 0827    Visit Number 6   Number of Visits 12   Date for PT Re-Evaluation 02/18/15   PT Start Time 0814   PT Stop Time 0910   PT Time Calculation (min) 56 min      Past Medical History  Diagnosis Date  . Club foot of both feet   . Speech impediment   . Tetralogy of Fallot     s/p waterson shunt  . CKD (chronic kidney disease)   . Left ventricular hypertrophy   . Right ventricular hypertrophy   . CHF (congestive heart failure)   . Hypertension   . Atrial fibrillation   . Vitamin D deficiency     Past Surgical History  Procedure Laterality Date  . Vsd repair    . Scoliosis    . Adenoidectomy    . Mouth surgery    . Wrist surgery Left     There were no vitals taken for this visit.  Visit Diagnosis:  Left foot pain      Subjective Assessment - 02/12/15 0812    Symptoms Did great after last Rx. Minimal pain today   Limitations Walking   Currently in Pain? Yes   Pain Score 2    Pain Location Ankle   Pain Orientation Left   Pain Descriptors / Indicators Aching   Aggravating Factors  Walking   Pain Relieving Factors Heat,e-stim                    [facility] Adult PT Treatment/Exercise - 02/12/15 0001    Modalities   Modalities Cryotherapy  Vasopneumatic x15 min   Moist Heat Therapy   Number Minutes Moist Heat 15 Minutes   Moist Heat Location --  LT foot   Electrical Stimulation   Electrical Stimulation Location foot/ankle   Electrical Stimulation Action Premod   Electrical Stimulation Parameters 41mn   Manual Therapy   Manual Therapy Passive ROM   Passive ROM Low load stretch for DF, inv,ever and  STW to Plantar fascia IASTM                     PT Long Term Goals - 02/12/15 0900    PT LONG TERM GOAL #1   Title demonstrate and/or verbalize techniques to reduce the risk of re-injury to include info on: anti-inflammatory (RICE Method)   Period Weeks   Status Achieved   PT LONG TERM GOAL #2   Title be independent with advanced HEP   Status On-going   PT LONG TERM GOAL #3   Title perform ADL's with pain not > 3/10   Period Weeks   Status Achieved   PT LONG TERM GOAL #4   Title increase Right ankle strength to 4+/5 to provide good stability while walking   Status Achieved  Met 02-12-15   PT LONG TERM GOAL #5   Title increase left ankle active DF to 6 degrees   Status On-going               Plan - 02/12/15 0829    Clinical Impression Statement Pt cont.s to do well with minimal pain  PT Treatment/Interventions ADLs/Self Care Home Management;Moist Heat;Therapeutic activities;Patient/family education;Therapeutic exercise;Passive range of motion;Ultrasound;Gait training;Manual techniques;Cryotherapy;Electrical Stimulation   PT Next Visit Plan cont with POC for ROM/Strength x 47moe visit and D/C to gym program.   pt going to MD for possible hernia surgury        Problem List Patient Active Problem List   Diagnosis Date Noted  . Incisional hernia, without obstruction or gangrene 02/28/2014  . 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 Vitamin D deficiency 06/25/2013  . CKD (chronic kidney disease) 06/25/2013  . S/P TOF (tetralogy of Fallot) repair 06/25/2013  . 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

## 2015-02-12 NOTE — Therapy (Signed)
[facility] [place], [place], [address] Phone: [phone]   Fax:  [phone]  Patient Details  Name: Bradley Graham MRN: [id number] Date of Birth: July 21, 1971 Referring Provider:  Chipper Herb, MD  Encounter Date: 02/12/2015   RAMSEUR,CHRIS, PTA 02/12/2015, 10:15 AM  [facility] [address] [place], [place], [address] Phone: [phone]   Fax:  [phone]

## 2015-02-14 ENCOUNTER — Ambulatory Visit (INDEPENDENT_AMBULATORY_CARE_PROVIDER_SITE_OTHER): Payer: Medicare Other | Admitting: Nurse Practitioner

## 2015-02-14 VITALS — BP 131/80 | HR 95 | Temp 98.2°F | Ht 64.0 in | Wt 172.0 lb

## 2015-02-14 DIAGNOSIS — J209 Acute bronchitis, unspecified: Secondary | ICD-10-CM | POA: Diagnosis not present

## 2015-02-14 MED ORDER — CEFDINIR 300 MG PO CAPS: 300.0000 mg | ORAL_CAPSULE | Freq: Two times a day (BID) | ORAL | Status: DC

## 2015-02-14 MED ORDER — HYDROCODONE-HOMATROPINE 5-1.5 MG/5ML PO SYRP: 5.0000 mL | ORAL_SOLUTION | Freq: Four times a day (QID) | ORAL | Status: DC | PRN

## 2015-02-14 MED ORDER — PREDNISONE 20 MG PO TABS: ORAL_TABLET | ORAL | Status: DC

## 2015-02-14 NOTE — Patient Instructions (Signed)

## 2015-02-14 NOTE — Progress Notes (Signed)
  Subjective:     Bradley Graham is a 44 y.o. male who presents for evaluation of sinus pain. Symptoms include: congestion, cough, foul breath, headaches, nasal congestion, post nasal drip, sinus pressure and sniffing. Onset of symptoms was 5 days ago. Symptoms have been gradually worsening since that time. Past history is significant for occasional episodes of bronchitis. Patient is a non-smoker.  The following portions of the patient's history were reviewed and updated as appropriate: allergies, current medications, past family history, past medical history, past social history, past surgical history and problem list.  Review of Systems Pertinent items are noted in HPI.   Objective:    BP 131/80 mmHg  Pulse 95  Temp(Src) 98.2 F (36.8 C) (Oral)  Ht 5\' 4"  (1.626 m)  Wt 172 lb (78.019 kg)  BMI 29.51 kg/m2 General appearance: alert and cooperative Eyes: conjunctivae/corneas clear. PERRL, EOM's intact. Fundi benign. Ears: normal TM and external ear canal left ear and abnormal external canal right ear - yellowish drainage in ear canal Nose: Nares normal. Septum midline. Mucosa normal. No drainage or sinus tenderness., green discharge, moderate congestion, turbinates red, no sinus tenderness Throat: lips, mucosa, and tongue normal; teeth and gums normal Neck: no adenopathy, no carotid bruit, no JVD, supple, symmetrical, trachea midline and thyroid not enlarged, symmetric, no tenderness/mass/nodules Lungs: normal percussion bilaterally and deep wet cough Heart: regular rate and rhythm, S1, S2 normal, no murmur, click, rub or gallop    Assessment:   Bronchitis   Plan:   Meds ordered this encounter  Medications  . cefdinir (OMNICEF) 300 MG capsule    Sig: Take 1 capsule (300 mg total) by mouth 2 (two) times daily.    Dispense:  20 capsule    Refill:  0    Order Specific Question:  Supervising Provider    Answer:  Chipper Herb [[id number]]  . HYDROcodone-homatropine (HYCODAN) 5-1.5  MG/5ML syrup    Sig: Take 5 mLs by mouth every 6 (six) hours as needed for cough.    Dispense:  120 mL    Refill:  0    Order Specific Question:  Supervising Provider    Answer:  Chipper Herb [[id number]]  . predniSONE (DELTASONE) 20 MG tablet    Sig: 2 po at sametime daily for 5 days    Dispense:  10 tablet    Refill:  0    Order Specific Question:  Supervising Provider    Answer:  Chipper Herb [[id number]]   1. Take meds as prescribed 2. Use a cool mist humidifier especially during the winter months and when heat has been humid. 3. Use saline nose sprays frequently 4. Saline irrigations of the nose can be very helpful if done frequently.  * 4X daily for 1 week*  * Use of a nettie pot can be helpful with this. Follow directions with this* 5. Drink plenty of fluids 6. Keep thermostat turn down low 7.For any cough or congestion  Use plain Mucinex- regular strength or max strength is fine   * Children- consult with Pharmacist for dosing 8. For fever or aces or pains- take tylenol or ibuprofen appropriate for age and weight.  * for fevers greater than 101 orally you may alternate ibuprofen and tylenol every  3 hours.   Mary-Margaret Hassell Done, FNP

## 2015-02-17 ENCOUNTER — Encounter: Payer: Self-pay | Admitting: *Deleted

## 2015-02-17 ENCOUNTER — Ambulatory Visit: Payer: Medicare Other | Admitting: *Deleted

## 2015-02-17 DIAGNOSIS — M79672 Pain in left foot: Secondary | ICD-10-CM

## 2015-02-17 DIAGNOSIS — Q661 Congenital talipes calcaneovarus: Secondary | ICD-10-CM | POA: Diagnosis not present

## 2015-02-17 NOTE — Therapy (Addendum)
[facility] [place], [place], [address] Phone: [phone]   Fax:  [phone]  Physical Therapy Treatment  Patient Details  Name: Bradley Graham MRN: [id number] Date of Birth: 07-22-1971 Referring Provider:  Chipper Herb, MD  Encounter Date: 02/17/2015      PT End of Session - 02/17/15 0938    Visit Number 7   Number of Visits 12   Date for PT Re-Evaluation 02/18/15   PT Start Time 0930   PT Stop Time 7124   PT Time Calculation (min) 59 min      Past Medical History  Diagnosis Date  . Club foot of both feet   . Speech impediment   . Tetralogy of Fallot     s/p waterson shunt  . CKD (chronic kidney disease)   . Left ventricular hypertrophy   . Right ventricular hypertrophy   . CHF (congestive heart failure)   . Hypertension   . Atrial fibrillation   . Vitamin D deficiency     Past Surgical History  Procedure Laterality Date  . Vsd repair    . Scoliosis    . Adenoidectomy    . Mouth surgery    . Wrist surgery Left     There were no vitals filed for this visit.  Visit Diagnosis:  Left foot pain      Subjective Assessment - 02/17/15 0929    Symptoms Did great after last Rx. Minimal pain today   Limitations Walking   Currently in Pain? Yes   Pain Score 2    Pain Location Ankle   Pain Orientation Left   Pain Descriptors / Indicators Sore                       [facility] Adult PT Treatment/Exercise - 02/17/15 0001    Exercises   Exercises Ankle   Modalities   Modalities Cryotherapy   Moist Heat Therapy   Number Minutes Moist Heat 15 Minutes   Moist Heat Location --  LT ankle   Cryotherapy   Number Minutes Cryotherapy 15 Minutes   Cryotherapy Location Ankle   Type of Cryotherapy Ice pack  Vasopneumatic   Electrical Stimulation   Electrical Stimulation Location foot/ankle   Electrical Stimulation Action premod   Electrical Stimulation Parameters 15   Manual Therapy   Manual  Therapy Passive ROM   Passive ROM Low load stretch for DF, inv,ever and STW to Plantar fascia IASTM                     PT Long Term Goals - 02/17/15 0939    PT LONG TERM GOAL #1   Title demonstrate and/or verbalize techniques to reduce the risk of re-injury to include info on: anti-inflammatory (RICE Method)   Period Weeks   Status Achieved   PT LONG TERM GOAL #2   Title be independent with advanced HEP   Period Weeks   Status Achieved  met 02-17-15   PT LONG TERM GOAL #3   Title perform ADL's with pain not > 3/10   Period Weeks   Status Achieved   PT LONG TERM GOAL #4   Title increase Right ankle strength to 4+/5 to provide good stability while walking   Time 6   Period Weeks   Status Achieved  4+/5   PT LONG TERM GOAL #5   Title increase left ankle active DF to 6 degrees   Time 6  Period Weeks   Status Not Met  AROM 4 degrees               Plan - 02/17/15 1015    Clinical Impression Statement Ptt did well today and has met most of his goals   PT Treatment/Interventions ADLs/Self Care Home Management;Moist Heat;Therapeutic activities;Patient/family education;Therapeutic exercise;Passive range of motion;Ultrasound;Gait training;Manual techniques;Cryotherapy;Electrical Stimulation   PT Next Visit Plan DC pt to Gym program. He has met all goals except ROM goal for DF due to tightness   PT Home Exercise Plan Exercises in Gym program   Consulted and Agree with Plan of Care Patient        Problem List Patient Active Problem List   Diagnosis Date Noted  . Incisional hernia, without obstruction or gangrene 02/28/2014  . 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 Vitamin D deficiency 06/25/2013  . CKD (chronic kidney disease) 06/25/2013  . S/P TOF (tetralogy of Fallot) repair 06/25/2013  . 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   Remaining deficits: All but ROM goal met.   Education / Equipment:  Plan: Patient agrees to discharge.  Patient goals were partially met. Patient is being discharged due to being pleased with the current functional level.  ?????      Kerby Borner, [place] MPT 02/17/2015, 12:17 PM  [facility] [address] [place], [place], [address] Phone: [phone]   Fax:  [phone]

## 2015-02-17 NOTE — Therapy (Signed)
[facility] [place], [place], [address] Phone: [phone]   Fax:  [phone]  Physical Therapy Treatment  Patient Details  Name: Bradley Graham MRN: [id number] Date of Birth: 07-22-1971 Referring Provider:  Chipper Herb, MD  Encounter Date: 02/17/2015      PT End of Session - 02/17/15 0938    Visit Number 7   Number of Visits 12   Date for PT Re-Evaluation 02/18/15   PT Start Time 0930   PT Stop Time 7124   PT Time Calculation (min) 59 min      Past Medical History  Diagnosis Date  . Club foot of both feet   . Speech impediment   . Tetralogy of Fallot     s/p waterson shunt  . CKD (chronic kidney disease)   . Left ventricular hypertrophy   . Right ventricular hypertrophy   . CHF (congestive heart failure)   . Hypertension   . Atrial fibrillation   . Vitamin D deficiency     Past Surgical History  Procedure Laterality Date  . Vsd repair    . Scoliosis    . Adenoidectomy    . Mouth surgery    . Wrist surgery Left     There were no vitals filed for this visit.  Visit Diagnosis:  Left foot pain      Subjective Assessment - 02/17/15 0929    Symptoms Did great after last Rx. Minimal pain today   Limitations Walking   Currently in Pain? Yes   Pain Score 2    Pain Location Ankle   Pain Orientation Left   Pain Descriptors / Indicators Sore                       [facility] Adult PT Treatment/Exercise - 02/17/15 0001    Exercises   Exercises Ankle   Modalities   Modalities Cryotherapy   Moist Heat Therapy   Number Minutes Moist Heat 15 Minutes   Moist Heat Location --  LT ankle   Cryotherapy   Number Minutes Cryotherapy 15 Minutes   Cryotherapy Location Ankle   Type of Cryotherapy Ice pack  Vasopneumatic   Electrical Stimulation   Electrical Stimulation Location foot/ankle   Electrical Stimulation Action premod   Electrical Stimulation Parameters 15   Manual Therapy   Manual  Therapy Passive ROM   Passive ROM Low load stretch for DF, inv,ever and STW to Plantar fascia IASTM                     PT Long Term Goals - 02/17/15 0939    PT LONG TERM GOAL #1   Title demonstrate and/or verbalize techniques to reduce the risk of re-injury to include info on: anti-inflammatory (RICE Method)   Period Weeks   Status Achieved   PT LONG TERM GOAL #2   Title be independent with advanced HEP   Period Weeks   Status Achieved  met 02-17-15   PT LONG TERM GOAL #3   Title perform ADL's with pain not > 3/10   Period Weeks   Status Achieved   PT LONG TERM GOAL #4   Title increase Right ankle strength to 4+/5 to provide good stability while walking   Time 6   Period Weeks   Status Achieved  4+/5   PT LONG TERM GOAL #5   Title increase left ankle active DF to 6 degrees   Time 6  Period Weeks   Status Not Met  AROM 4 degrees               Plan - 02/17/15 1015    Clinical Impression Statement Ptt did well today and has met most of his goals   PT Treatment/Interventions ADLs/Self Care Home Management;Moist Heat;Therapeutic activities;Patient/family education;Therapeutic exercise;Passive range of motion;Ultrasound;Gait training;Manual techniques;Cryotherapy;Electrical Stimulation   PT Next Visit Plan DC pt to Gym program. He has met all goals except ROM goal for DF due to tightness   PT Home Exercise Plan Exercises in Gym program   Consulted and Agree with Plan of Care Patient        Problem List Patient Active Problem List   Diagnosis Date Noted  . Incisional hernia, without obstruction or gangrene 02/28/2014  . 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 Vitamin D deficiency 06/25/2013  . CKD (chronic kidney disease) 06/25/2013  . S/P TOF (tetralogy of Fallot) repair 06/25/2013  . Speech impediment secondary to organic lesion 06/25/2013    RAMSEUR,CHRIS,PTA 02/17/2015, 10:23 AM  [facility], [place], [address] Phone: [phone]   Fax:  [phone]

## 2015-02-18 DIAGNOSIS — L574 Cutis laxa senilis: Secondary | ICD-10-CM | POA: Diagnosis not present

## 2015-02-18 DIAGNOSIS — N3289 Other specified disorders of bladder: Secondary | ICD-10-CM | POA: Diagnosis not present

## 2015-02-18 DIAGNOSIS — K8020 Calculus of gallbladder without cholecystitis without obstruction: Secondary | ICD-10-CM | POA: Diagnosis not present

## 2015-02-18 DIAGNOSIS — K432 Incisional hernia without obstruction or gangrene: Secondary | ICD-10-CM | POA: Diagnosis not present

## 2015-02-18 DIAGNOSIS — Z9889 Other specified postprocedural states: Secondary | ICD-10-CM | POA: Diagnosis not present

## 2015-02-18 DIAGNOSIS — K4090 Unilateral inguinal hernia, without obstruction or gangrene, not specified as recurrent: Secondary | ICD-10-CM | POA: Diagnosis not present

## 2015-02-18 DIAGNOSIS — Z981 Arthrodesis status: Secondary | ICD-10-CM | POA: Diagnosis not present

## 2015-02-18 DIAGNOSIS — M438X6 Other specified deforming dorsopathies, lumbar region: Secondary | ICD-10-CM | POA: Diagnosis not present

## 2015-02-18 DIAGNOSIS — N281 Cyst of kidney, acquired: Secondary | ICD-10-CM | POA: Diagnosis not present

## 2015-02-18 DIAGNOSIS — M4180 Other forms of scoliosis, site unspecified: Secondary | ICD-10-CM | POA: Diagnosis not present

## 2015-02-20 DIAGNOSIS — M4184 Other forms of scoliosis, thoracic region: Secondary | ICD-10-CM | POA: Diagnosis not present

## 2015-02-20 DIAGNOSIS — J309 Allergic rhinitis, unspecified: Secondary | ICD-10-CM | POA: Insufficient documentation

## 2015-02-20 DIAGNOSIS — R059 Cough, unspecified: Secondary | ICD-10-CM | POA: Insufficient documentation

## 2015-02-20 DIAGNOSIS — H60501 Unspecified acute noninfective otitis externa, right ear: Secondary | ICD-10-CM | POA: Insufficient documentation

## 2015-02-20 DIAGNOSIS — K219 Gastro-esophageal reflux disease without esophagitis: Secondary | ICD-10-CM | POA: Diagnosis not present

## 2015-02-20 DIAGNOSIS — H60391 Other infective otitis externa, right ear: Secondary | ICD-10-CM | POA: Diagnosis not present

## 2015-02-20 DIAGNOSIS — R05 Cough: Secondary | ICD-10-CM | POA: Diagnosis not present

## 2015-02-20 DIAGNOSIS — J3089 Other allergic rhinitis: Secondary | ICD-10-CM | POA: Diagnosis not present

## 2015-02-23 ENCOUNTER — Encounter: Payer: Self-pay | Admitting: Sports Medicine

## 2015-02-23 ENCOUNTER — Ambulatory Visit (INDEPENDENT_AMBULATORY_CARE_PROVIDER_SITE_OTHER): Payer: Medicare Other | Admitting: Sports Medicine

## 2015-02-23 VITALS — BP 139/74 | HR 98 | Ht 64.0 in | Wt 172.0 lb

## 2015-02-23 DIAGNOSIS — M76829 Posterior tibial tendinitis, unspecified leg: Secondary | ICD-10-CM

## 2015-02-23 DIAGNOSIS — M6789 Other specified disorders of synovium and tendon, multiple sites: Secondary | ICD-10-CM

## 2015-02-23 NOTE — Assessment & Plan Note (Addendum)
Left side. Hx bilateral club feet. Exam consistent with posterior tibialis tendon insufficiency with external rotation/pes planus and "too many toes" sign on the left. Given sport insoles with scaphoid pads and 1st ray post for left side for additional cushion over left medial forefoot callus. Recommended against additional surgical intervention if conservative measures are helping. F/u in 3 weeks to eval if insoles have been helping, may consider custom orthotic inserts at that time if patient finds temporary inserts to be comfortable. We will attempt to obtain surgical records prior to follow up visit.   Patient seen and evaluated with the above resident. Agree with the plan of care.

## 2015-02-23 NOTE — Progress Notes (Signed)
  Bradley Graham - 45 y.o. male MRN [id number]  Date of birth: 05-Oct-1971  SUBJECTIVE:  Including CC & ROS.  No chief complaint on file.  Bradley Graham is a 44 y.o. male presenting for new patient evaluation for left foot/ankle pain. History is primarily provided by his mother who accompanies him today. Pt has a history of bilateral club foot corrected with braces/casting, and development of contraction of the left foot in 2007 after prolonged hospitalization/intubation after heart surgery. He had surgery on the left foot 2 years ago at [facility], no hardware and mom is unable to remember exactly the name of the procedure. This was a reportedly very difficult searching to recover from due to pain. He continues to have left foot pain primarily medial side and the bottom of the foot, worse near the first MTP joint/forefoot. Pain is often worse the more he stands throughout the day and at night. He has had intermittent PT for strengthening of the ankles with some improvement. He was referred here for possible orthotics.   HISTORY: Past Medical, Surgical, Social, and Family History Reviewed & Updated per EMR.   Pertinent Historical Findings include: Tetralogy of fallot s/p 4 surgeries Bilateral club feet Left foot surgery ~2014 CKD  PHYSICAL EXAM:  VS: BP:139/74 mmHg  HR:98bpm  TEMP: ( )  RESP:   HT:5\' 4"  (162.6 cm)   WT:172 lb (78.019 kg)  BMI:29.6 PHYSICAL EXAM: General: NAD Foot exam: INSPECTION: well healed surgical scars noted left medial foot. Pes cavus right foot with partial collapse of longitudinal arch on standing. Minimal longitudinal arch on the left. Loss of posterior tibialis tendon on the left with inward collapse of foot on standing and "too many toes" sign. PALPATION: nontender. Left 1st ray callus noted on medial dorsal surface.  RANGE OF MOTION: decreased ROM b/l ankles  STRENGTH: 5/5 bilaterally ankle flexion/extension, big toe flexion/extension. Unable to stand on tiptoes on the  left. NEUROVASCULAR STATUS: 2+ DP pulses bilaterally. spasm of both legs noted. 2+ patellar and achilles reflexes b/l. GAIT: walks with assistance.   ASSESSMENT & PLAN: See problem based charting & AVS for pt instructions.

## 2015-03-16 ENCOUNTER — Encounter: Payer: Self-pay | Admitting: Sports Medicine

## 2015-03-16 ENCOUNTER — Ambulatory Visit (INDEPENDENT_AMBULATORY_CARE_PROVIDER_SITE_OTHER): Payer: Medicare Other | Admitting: Sports Medicine

## 2015-03-16 VITALS — BP 152/89 | HR 89 | Ht 64.0 in | Wt 172.0 lb

## 2015-03-16 DIAGNOSIS — M76829 Posterior tibial tendinitis, unspecified leg: Secondary | ICD-10-CM

## 2015-03-16 DIAGNOSIS — M6789 Other specified disorders of synovium and tendon, multiple sites: Secondary | ICD-10-CM | POA: Diagnosis not present

## 2015-03-16 NOTE — Progress Notes (Signed)
Patient ID: Bradley Graham, male   DOB: 05-02-1971, 44 y.o.   MRN: [id number]  Patient comes in today for follow-up. I was able to get the records from [facility]. He had a rather extensive ankle reconstruction done for a clubfoot deformity with equinus contracture, cavovarus and abducted forefoot. This was done in 2013 by Dr.Deorio. He is with his mom today. He found the green sports insoles with the first ray post quite comfortable up until about a week ago. His pain has returned. We had discussed the possibility of custom orthotics and both he and his mom would like to go ahead with that today.  Custom orthotics were constructed for the patient today. These were a size 9 orthotic using a Fasttech blank. I added a first ray post to the left. He found them to be very comfortable prior to leaving the office. He may switch back and forth between his custom orthotics in his green sports insoles as he sees fit. Hopefully these help. Follow-up with me when necessary.   Patient was fitted for a : standard, cushioned, semi-rigid orthotic. The orthotic was heated and afterward the patient stood on the orthotic blank positioned on the orthotic stand. The patient was positioned in subtalar neutral position and 10 degrees of ankle dorsiflexion in a weight bearing stance. After completion of molding, a stable base was applied to the orthotic blank. The blank was ground to a stable position for weight bearing. Size: 9 Fasttech (black) Base: blue EVA Posting: Left first ray post Additional orthotic padding: none     Total of 30 minutes was spent with the patient with greater than 50% of the time spent in face-to-face consultation discussing orthotic construction, instruction, and fitting.

## 2015-03-26 DIAGNOSIS — H6092 Unspecified otitis externa, left ear: Secondary | ICD-10-CM | POA: Diagnosis not present

## 2015-03-26 DIAGNOSIS — H9193 Unspecified hearing loss, bilateral: Secondary | ICD-10-CM | POA: Diagnosis not present

## 2015-03-26 DIAGNOSIS — H903 Sensorineural hearing loss, bilateral: Secondary | ICD-10-CM | POA: Diagnosis not present

## 2015-03-26 DIAGNOSIS — H6123 Impacted cerumen, bilateral: Secondary | ICD-10-CM | POA: Diagnosis not present

## 2015-03-26 DIAGNOSIS — H68103 Unspecified obstruction of Eustachian tube, bilateral: Secondary | ICD-10-CM | POA: Diagnosis not present

## 2015-04-06 DIAGNOSIS — Z888 Allergy status to other drugs, medicaments and biological substances status: Secondary | ICD-10-CM | POA: Diagnosis not present

## 2015-04-06 DIAGNOSIS — Z881 Allergy status to other antibiotic agents status: Secondary | ICD-10-CM | POA: Diagnosis not present

## 2015-04-06 DIAGNOSIS — Z9889 Other specified postprocedural states: Secondary | ICD-10-CM | POA: Diagnosis not present

## 2015-04-06 DIAGNOSIS — K469 Unspecified abdominal hernia without obstruction or gangrene: Secondary | ICD-10-CM | POA: Diagnosis not present

## 2015-04-06 DIAGNOSIS — Z886 Allergy status to analgesic agent status: Secondary | ICD-10-CM | POA: Diagnosis not present

## 2015-04-06 DIAGNOSIS — N990 Postprocedural (acute) (chronic) kidney failure: Secondary | ICD-10-CM | POA: Diagnosis not present

## 2015-04-06 DIAGNOSIS — E785 Hyperlipidemia, unspecified: Secondary | ICD-10-CM | POA: Diagnosis not present

## 2015-04-06 DIAGNOSIS — Z992 Dependence on renal dialysis: Secondary | ICD-10-CM | POA: Diagnosis not present

## 2015-04-06 DIAGNOSIS — E663 Overweight: Secondary | ICD-10-CM | POA: Diagnosis not present

## 2015-04-06 DIAGNOSIS — Z880 Allergy status to penicillin: Secondary | ICD-10-CM | POA: Diagnosis not present

## 2015-04-06 DIAGNOSIS — I129 Hypertensive chronic kidney disease with stage 1 through stage 4 chronic kidney disease, or unspecified chronic kidney disease: Secondary | ICD-10-CM | POA: Diagnosis not present

## 2015-04-06 DIAGNOSIS — N179 Acute kidney failure, unspecified: Secondary | ICD-10-CM | POA: Diagnosis not present

## 2015-04-06 DIAGNOSIS — I272 Other secondary pulmonary hypertension: Secondary | ICD-10-CM | POA: Diagnosis not present

## 2015-04-06 DIAGNOSIS — N183 Chronic kidney disease, stage 3 (moderate): Secondary | ICD-10-CM | POA: Diagnosis not present

## 2015-04-06 DIAGNOSIS — Z6828 Body mass index (BMI) 28.0-28.9, adult: Secondary | ICD-10-CM | POA: Diagnosis not present

## 2015-04-08 DIAGNOSIS — Z8719 Personal history of other diseases of the digestive system: Secondary | ICD-10-CM | POA: Diagnosis not present

## 2015-04-08 DIAGNOSIS — N183 Chronic kidney disease, stage 3 (moderate): Secondary | ICD-10-CM | POA: Diagnosis not present

## 2015-04-08 DIAGNOSIS — R0981 Nasal congestion: Secondary | ICD-10-CM | POA: Diagnosis not present

## 2015-04-08 DIAGNOSIS — Z7982 Long term (current) use of aspirin: Secondary | ICD-10-CM | POA: Diagnosis not present

## 2015-04-08 DIAGNOSIS — Z886 Allergy status to analgesic agent status: Secondary | ICD-10-CM | POA: Diagnosis not present

## 2015-04-08 DIAGNOSIS — R05 Cough: Secondary | ICD-10-CM | POA: Diagnosis not present

## 2015-04-08 DIAGNOSIS — K219 Gastro-esophageal reflux disease without esophagitis: Secondary | ICD-10-CM | POA: Diagnosis not present

## 2015-04-08 DIAGNOSIS — Z91048 Other nonmedicinal substance allergy status: Secondary | ICD-10-CM | POA: Diagnosis not present

## 2015-04-08 DIAGNOSIS — Z880 Allergy status to penicillin: Secondary | ICD-10-CM | POA: Diagnosis not present

## 2015-04-08 DIAGNOSIS — Z881 Allergy status to other antibiotic agents status: Secondary | ICD-10-CM | POA: Diagnosis not present

## 2015-04-08 DIAGNOSIS — I129 Hypertensive chronic kidney disease with stage 1 through stage 4 chronic kidney disease, or unspecified chronic kidney disease: Secondary | ICD-10-CM | POA: Diagnosis not present

## 2015-04-08 DIAGNOSIS — I509 Heart failure, unspecified: Secondary | ICD-10-CM | POA: Diagnosis not present

## 2015-04-08 DIAGNOSIS — Z888 Allergy status to other drugs, medicaments and biological substances status: Secondary | ICD-10-CM | POA: Diagnosis not present

## 2015-04-08 DIAGNOSIS — Q388 Other congenital malformations of pharynx: Secondary | ICD-10-CM | POA: Diagnosis not present

## 2015-05-05 ENCOUNTER — Ambulatory Visit: Payer: Self-pay | Admitting: *Deleted

## 2015-05-06 ENCOUNTER — Encounter: Payer: Self-pay | Admitting: *Deleted

## 2015-05-06 ENCOUNTER — Ambulatory Visit (INDEPENDENT_AMBULATORY_CARE_PROVIDER_SITE_OTHER): Payer: Medicare Other | Admitting: *Deleted

## 2015-05-06 VITALS — BP 138/81 | HR 91 | Ht 64.0 in | Wt 173.8 lb

## 2015-05-06 DIAGNOSIS — Z0000 Encounter for general adult medical examination without abnormal findings: Secondary | ICD-10-CM

## 2015-05-06 NOTE — Patient Instructions (Addendum)
Preventive Care for Adults A healthy lifestyle and preventive care can promote health and wellness. Preventive health guidelines for men include the following key practices:  A routine yearly physical is a good way to check with your health care provider about your health and preventative screening. It is a chance to share any concerns and updates on your health and to receive a thorough exam.  Visit your dentist for a routine exam and preventative care every 6 months. Brush your teeth twice a day and floss once a day. Good oral hygiene prevents tooth decay and gum disease.  The frequency of eye exams is based on your age, health, family medical history, use of contact lenses, and other factors. Follow your health care provider's recommendations for frequency of eye exams.  Eat a healthy diet. Foods such as vegetables, fruits, whole grains, low-fat dairy products, and lean protein foods contain the nutrients you need without too many calories. Decrease your intake of foods high in solid fats, added sugars, and salt. Eat the right amount of calories for you.Get information about a proper diet from your health care provider, if necessary.  Regular physical exercise is one of the most important things you can do for your health. Most adults should get at least 150 minutes of moderate-intensity exercise (any activity that increases your heart rate and causes you to sweat) each week. In addition, most adults need muscle-strengthening exercises on 2 or more days a week.  Maintain a healthy weight. The body mass index (BMI) is a screening tool to identify possible weight problems. It provides an estimate of body fat based on height and weight. Your health care provider can find your BMI and can help you achieve or maintain a healthy weight.For adults 20 years and older:  A BMI below 18.5 is considered underweight.  A BMI of 18.5 to 24.9 is normal.  A BMI of 25 to 29.9 is considered overweight.  A BMI  of 30 and above is considered obese.  Maintain normal blood lipids and cholesterol levels by exercising and minimizing your intake of saturated fat. Eat a balanced diet with plenty of fruit and vegetables. Blood tests for lipids and cholesterol should begin at age 50 and be repeated every 5 years. If your lipid or cholesterol levels are high, you are over 50, or you are at high risk for heart disease, you may need your cholesterol levels checked more frequently.Ongoing high lipid and cholesterol levels should be treated with medicines if diet and exercise are not working.  If you smoke, find out from your health care provider how to quit. If you do not use tobacco, do not start.  Lung cancer screening is recommended for adults aged 73-80 years who are at high risk for developing lung cancer because of a history of smoking. A yearly low-dose CT scan of the lungs is recommended for people who have at least a 30-pack-year history of smoking and are a current smoker or have quit within the past 15 years. A pack year of smoking is smoking an average of 1 pack of cigarettes a day for 1 year (for example: 1 pack a day for 30 years or 2 packs a day for 15 years). Yearly screening should continue until the smoker has stopped smoking for at least 15 years. Yearly screening should be stopped for people who develop a health problem that would prevent them from having lung cancer treatment.  If you choose to drink alcohol, do not have more than  2 drinks per day. One drink is considered to be 12 ounces (355 mL) of beer, 5 ounces (148 mL) of wine, or 1.5 ounces (44 mL) of liquor.  Avoid use of street drugs. Do not share needles with anyone. Ask for help if you need support or instructions about stopping the use of drugs.  High blood pressure causes heart disease and increases the risk of stroke. Your blood pressure should be checked at least every 1-2 years. Ongoing high blood pressure should be treated with  medicines, if weight loss and exercise are not effective.  If you are 45-79 years old, ask your health care provider if you should take aspirin to prevent heart disease.  Diabetes screening involves taking a blood sample to check your fasting blood sugar level. This should be done once every 3 years, after age 45, if you are within normal weight and without risk factors for diabetes. Testing should be considered at a younger age or be carried out more frequently if you are overweight and have at least 1 risk factor for diabetes.  Colorectal cancer can be detected and often prevented. Most routine colorectal cancer screening begins at the age of 50 and continues through age 75. However, your health care provider may recommend screening at an earlier age if you have risk factors for colon cancer. On a yearly basis, your health care provider may provide home test kits to check for hidden blood in the stool. Use of a small camera at the end of a tube to directly examine the colon (sigmoidoscopy or colonoscopy) can detect the earliest forms of colorectal cancer. Talk to your health care provider about this at age 50, when routine screening begins. Direct exam of the colon should be repeated every 5-10 years through age 75, unless early forms of precancerous polyps or small growths are found.  People who are at an increased risk for hepatitis B should be screened for this virus. You are considered at high risk for hepatitis B if:  You were born in a country where hepatitis B occurs often. Talk with your health care provider about which countries are considered high risk.  Your parents were born in a high-risk country and you have not received a shot to protect against hepatitis B (hepatitis B vaccine).  You have HIV or AIDS.  You use needles to inject street drugs.  You live with, or have sex with, someone who has hepatitis B.  You are a man who has sex with other men (MSM).  You get hemodialysis  treatment.  You take certain medicines for conditions such as cancer, organ transplantation, and autoimmune conditions.  Hepatitis C blood testing is recommended for all people born from 1945 through 1965 and any individual with known risks for hepatitis C.  Practice safe sex. Use condoms and avoid high-risk sexual practices to reduce the spread of sexually transmitted infections (STIs). STIs include gonorrhea, chlamydia, syphilis, trichomonas, herpes, HPV, and human immunodeficiency virus (HIV). Herpes, HIV, and HPV are viral illnesses that have no cure. They can result in disability, cancer, and death.  If you are at risk of being infected with HIV, it is recommended that you take a prescription medicine daily to prevent HIV infection. This is called preexposure prophylaxis (PrEP). You are considered at risk if:  You are a man who has sex with other men (MSM) and have other risk factors.  You are a heterosexual man, are sexually active, and are at increased risk for HIV infection.    You take drugs by injection.  You are sexually active with a partner who has HIV.  Talk with your health care provider about whether you are at high risk of being infected with HIV. If you choose to begin PrEP, you should first be tested for HIV. You should then be tested every 3 months for as long as you are taking PrEP.  A one-time screening for abdominal aortic aneurysm (AAA) and surgical repair of large AAAs by ultrasound are recommended for men ages 32 to 67 years who are current or former smokers.  Healthy men should no longer receive prostate-specific antigen (PSA) blood tests as part of routine cancer screening. Talk with your health care provider about prostate cancer screening.  Testicular cancer screening is not recommended for adult males who have no symptoms. Screening includes self-exam, a health care provider exam, and other screening tests. Consult with your health care provider about any symptoms  you have or any concerns you have about testicular cancer.  Use sunscreen. Apply sunscreen liberally and repeatedly throughout the day. You should seek shade when your shadow is shorter than you. Protect yourself by wearing long sleeves, pants, a wide-brimmed hat, and sunglasses year round, whenever you are outdoors.  Once a month, do a whole-body skin exam, using a mirror to look at the skin on your back. Tell your health care provider about new moles, moles that have irregular borders, moles that are larger than a pencil eraser, or moles that have changed in shape or color.  Stay current with required vaccines (immunizations).  Influenza vaccine. All adults should be immunized every year.  Tetanus, diphtheria, and acellular pertussis (Td, Tdap) vaccine. An adult who has not previously received Tdap or who does not know his vaccine status should receive 1 dose of Tdap. This initial dose should be followed by tetanus and diphtheria toxoids (Td) booster doses every 10 years. Adults with an unknown or incomplete history of completing a 3-dose immunization series with Td-containing vaccines should begin or complete a primary immunization series including a Tdap dose. Adults should receive a Td booster every 10 years.  Varicella vaccine. An adult without evidence of immunity to varicella should receive 2 doses or a second dose if he has previously received 1 dose.  Human papillomavirus (HPV) vaccine. Males aged 68-21 years who have not received the vaccine previously should receive the 3-dose series. Males aged 22-26 years may be immunized. Immunization is recommended through the age of 6 years for any male who has sex with males and did not get any or all doses earlier. Immunization is recommended for any person with an immunocompromised condition through the age of 49 years if he did not get any or all doses earlier. During the 3-dose series, the second dose should be obtained 4-8 weeks after the first  dose. The third dose should be obtained 24 weeks after the first dose and 16 weeks after the second dose.  Zoster vaccine. One dose is recommended for adults aged 50 years or older unless certain conditions are present.  Measles, mumps, and rubella (MMR) vaccine. Adults born before [birth year] generally are considered immune to measles and mumps. Adults born in [birth year] or later should have 1 or more doses of MMR vaccine unless there is a contraindication to the vaccine or there is laboratory evidence of immunity to each of the three diseases. A routine second dose of MMR vaccine should be obtained at least 28 days after the first dose for students attending postsecondary  schools, health care workers, or international travelers. People who received inactivated measles vaccine or an unknown type of measles vaccine during 1963-1967 should receive 2 doses of MMR vaccine. People who received inactivated mumps vaccine or an unknown type of mumps vaccine before 1979 and are at high risk for mumps infection should consider immunization with 2 doses of MMR vaccine. Unvaccinated health care workers born before 1957 who lack laboratory evidence of measles, mumps, or rubella immunity or laboratory confirmation of disease should consider measles and mumps immunization with 2 doses of MMR vaccine or rubella immunization with 1 dose of MMR vaccine.  Pneumococcal 13-valent conjugate (PCV13) vaccine. When indicated, a person who is uncertain of his immunization history and has no record of immunization should receive the PCV13 vaccine. An adult aged 19 years or older who has certain medical conditions and has not been previously immunized should receive 1 dose of PCV13 vaccine. This PCV13 should be followed with a dose of pneumococcal polysaccharide (PPSV23) vaccine. The PPSV23 vaccine dose should be obtained at least 8 weeks after the dose of PCV13 vaccine. An adult aged 19 years or older who has certain medical conditions and  previously received 1 or more doses of PPSV23 vaccine should receive 1 dose of PCV13. The PCV13 vaccine dose should be obtained 1 or more years after the last PPSV23 vaccine dose.  Pneumococcal polysaccharide (PPSV23) vaccine. When PCV13 is also indicated, PCV13 should be obtained first. All adults aged 65 years and older should be immunized. An adult younger than age 65 years who has certain medical conditions should be immunized. Any person who resides in a nursing home or long-term care facility should be immunized. An adult smoker should be immunized. People with an immunocompromised condition and certain other conditions should receive both PCV13 and PPSV23 vaccines. People with human immunodeficiency virus (HIV) infection should be immunized as soon as possible after diagnosis. Immunization during chemotherapy or radiation therapy should be avoided. Routine use of PPSV23 vaccine is not recommended for American Indians, [place] Natives, or people younger than 65 years unless there are medical conditions that require PPSV23 vaccine. When indicated, people who have unknown immunization and have no record of immunization should receive PPSV23 vaccine. One-time revaccination 5 years after the first dose of PPSV23 is recommended for people aged 19-64 years who have chronic kidney failure, nephrotic syndrome, asplenia, or immunocompromised conditions. People who received 1-2 doses of PPSV23 before age 65 years should receive another dose of PPSV23 vaccine at age 65 years or later if at least 5 years have passed since the previous dose. Doses of PPSV23 are not needed for people immunized with PPSV23 at or after age 65 years.  Meningococcal vaccine. Adults with asplenia or persistent complement component deficiencies should receive 2 doses of quadrivalent meningococcal conjugate (MenACWY-D) vaccine. The doses should be obtained at least 2 months apart. Microbiologists working with certain meningococcal bacteria,  military recruits, people at risk during an outbreak, and people who travel to or live in countries with a high rate of meningitis should be immunized. A first-year college student up through age 21 years who is living in a residence hall should receive a dose if he did not receive a dose on or after his 16th birthday. Adults who have certain high-risk conditions should receive one or more doses of vaccine.  Hepatitis A vaccine. Adults who wish to be protected from this disease, have certain high-risk conditions, work with hepatitis A-infected animals, work in hepatitis A research labs, or   travel to or work in countries with a high rate of hepatitis A should be immunized. Adults who were previously unvaccinated and who anticipate close contact with an international adoptee during the first 60 days after arrival in the [place] from a country with a high rate of hepatitis A should be immunized.  Hepatitis B vaccine. Adults should be immunized if they wish to be protected from this disease, have certain high-risk conditions, may be exposed to blood or other infectious body fluids, are household contacts or sex partners of hepatitis B positive people, are clients or workers in certain care facilities, or travel to or work in countries with a high rate of hepatitis B.  Haemophilus influenzae type b (Hib) vaccine. A previously unvaccinated person with asplenia or sickle cell disease or having a scheduled splenectomy should receive 1 dose of Hib vaccine. Regardless of previous immunization, a recipient of a hematopoietic stem cell transplant should receive a 3-dose series 6-12 months after his successful transplant. Hib vaccine is not recommended for adults with HIV infection. Preventive Service / Frequency Ages 52 to 17  Blood pressure check.** / Every 1 to 2 years.  Lipid and cholesterol check.** / Every 5 years beginning at age 69.  Hepatitis C blood test.** / For any individual with known risks for  hepatitis C.  Skin self-exam. / Monthly.  Influenza vaccine. / Every year.  Tetanus, diphtheria, and acellular pertussis (Tdap, Td) vaccine.** / Consult your health care provider. 1 dose of Td every 10 years.  Varicella vaccine.** / Consult your health care provider.  HPV vaccine. / 3 doses over 6 months, if 72 or younger.  Measles, mumps, rubella (MMR) vaccine.** / You need at least 1 dose of MMR if you were born in 1957 or later. You may also need a second dose.  Pneumococcal 13-valent conjugate (PCV13) vaccine.** / Consult your health care provider.  Pneumococcal polysaccharide (PPSV23) vaccine.** / 1 to 2 doses if you smoke cigarettes or if you have certain conditions.  Meningococcal vaccine.** / 1 dose if you are age 35 to 60 years and a Market researcher living in a residence hall, or have one of several medical conditions. You may also need additional booster doses.  Hepatitis A vaccine.** / Consult your health care provider.  Hepatitis B vaccine.** / Consult your health care provider.  Haemophilus influenzae type b (Hib) vaccine.** / Consult your health care provider. Ages 35 to 8  Blood pressure check.** / Every 1 to 2 years.  Lipid and cholesterol check.** / Every 5 years beginning at age 57.  Lung cancer screening. / Every year if you are aged 44-80 years and have a 30-pack-year history of smoking and currently smoke or have quit within the past 15 years. Yearly screening is stopped once you have quit smoking for at least 15 years or develop a health problem that would prevent you from having lung cancer treatment.  Fecal occult blood test (FOBT) of stool. / Every year beginning at age 55 and continuing until age 73. You may not have to do this test if you get a colonoscopy every 10 years.  Flexible sigmoidoscopy** or colonoscopy.** / Every 5 years for a flexible sigmoidoscopy or every 10 years for a colonoscopy beginning at age 28 and continuing until age  1.  Hepatitis C blood test.** / For all people born from [birth year] through 1965 and any individual with known risks for hepatitis C.  Skin self-exam. / Monthly.  Influenza vaccine. / Every  year.  Tetanus, diphtheria, and acellular pertussis (Tdap/Td) vaccine.** / Consult your health care provider. 1 dose of Td every 10 years.  Varicella vaccine.** / Consult your health care provider.  Zoster vaccine.** / 1 dose for adults aged 60 years or older.  Measles, mumps, rubella (MMR) vaccine.** / You need at least 1 dose of MMR if you were born in 1957 or later. You may also need a second dose.  Pneumococcal 13-valent conjugate (PCV13) vaccine.** / Consult your health care provider.  Pneumococcal polysaccharide (PPSV23) vaccine.** / 1 to 2 doses if you smoke cigarettes or if you have certain conditions.  Meningococcal vaccine.** / Consult your health care provider.  Hepatitis A vaccine.** / Consult your health care provider.  Hepatitis B vaccine.** / Consult your health care provider.  Haemophilus influenzae type b (Hib) vaccine.** / Consult your health care provider. Ages 65 and over  Blood pressure check.** / Every 1 to 2 years.  Lipid and cholesterol check.**/ Every 5 years beginning at age 20.  Lung cancer screening. / Every year if you are aged 55-80 years and have a 30-pack-year history of smoking and currently smoke or have quit within the past 15 years. Yearly screening is stopped once you have quit smoking for at least 15 years or develop a health problem that would prevent you from having lung cancer treatment.  Fecal occult blood test (FOBT) of stool. / Every year beginning at age 50 and continuing until age 75. You may not have to do this test if you get a colonoscopy every 10 years.  Flexible sigmoidoscopy** or colonoscopy.** / Every 5 years for a flexible sigmoidoscopy or every 10 years for a colonoscopy beginning at age 50 and continuing until age 75.  Hepatitis C blood  test.** / For all people born from 1945 through 1965 and any individual with known risks for hepatitis C.  Abdominal aortic aneurysm (AAA) screening.** / A one-time screening for ages 65 to 75 years who are current or former smokers.  Skin self-exam. / Monthly.  Influenza vaccine. / Every year.  Tetanus, diphtheria, and acellular pertussis (Tdap/Td) vaccine.** / 1 dose of Td every 10 years.  Varicella vaccine.** / Consult your health care provider.  Zoster vaccine.** / 1 dose for adults aged 60 years or older.  Pneumococcal 13-valent conjugate (PCV13) vaccine.** / Consult your health care provider.  Pneumococcal polysaccharide (PPSV23) vaccine.** / 1 dose for all adults aged 65 years and older.  Meningococcal vaccine.** / Consult your health care provider.  Hepatitis A vaccine.** / Consult your health care provider.  Hepatitis B vaccine.** / Consult your health care provider.  Haemophilus influenzae type b (Hib) vaccine.** / Consult your health care provider. **Family history and personal history of risk and conditions may change your health care provider's recommendations. Document Released: 01/17/2002 Document Revised: 11/26/2013 Document Reviewed: 04/18/2011 [employer] Patient Information 2015 [employer]. This information is not intended to replace advice given to you by your health care provider. Make sure you discuss any questions you have with your health care provider. DASH Eating Plan DASH stands for "Dietary Approaches to Stop Hypertension." The DASH eating plan is a healthy eating plan that has been shown to reduce high blood pressure (hypertension). Additional health benefits may include reducing the risk of type 2 diabetes mellitus, heart disease, and stroke. The DASH eating plan may also help with weight loss. WHAT DO I NEED TO KNOW ABOUT THE DASH EATING PLAN? For the DASH eating plan, you will follow these   general guidelines:  Choose foods with a percent daily value  for sodium of less than 5% (as listed on the food label).  Use salt-free seasonings or herbs instead of table salt or sea salt.  Check with your health care provider or pharmacist before using salt substitutes.  Eat lower-sodium products, often labeled as "lower sodium" or "no salt added."  Eat fresh foods.  Eat more vegetables, fruits, and low-fat dairy products.  Choose whole grains. Look for the word "whole" as the first word in the ingredient list.  Choose fish and skinless chicken or turkey more often than red meat. Limit fish, poultry, and meat to 6 oz (170 g) each day.  Limit sweets, desserts, sugars, and sugary drinks.  Choose heart-healthy fats.  Limit cheese to 1 oz (28 g) per day.  Eat more home-cooked food and less restaurant, buffet, and fast food.  Limit fried foods.  Cook foods using methods other than frying.  Limit canned vegetables. If you do use them, rinse them well to decrease the sodium.  When eating at a restaurant, ask that your food be prepared with less salt, or no salt if possible. WHAT FOODS CAN I EAT? Seek help from a dietitian for individual calorie needs. Grains Whole grain or whole wheat bread. Brown rice. Whole grain or whole wheat pasta. Quinoa, bulgur, and whole grain cereals. Low-sodium cereals. Corn or whole wheat flour tortillas. Whole grain cornbread. Whole grain crackers. Low-sodium crackers. Vegetables Fresh or frozen vegetables (raw, steamed, roasted, or grilled). Low-sodium or reduced-sodium tomato and vegetable juices. Low-sodium or reduced-sodium tomato sauce and paste. Low-sodium or reduced-sodium canned vegetables.  Fruits All fresh, canned (in natural juice), or frozen fruits. Meat and Other Protein Products Ground beef (85% or leaner), grass-fed beef, or beef trimmed of fat. Skinless chicken or turkey. Ground chicken or turkey. Pork trimmed of fat. All fish and seafood. Eggs. Dried beans, peas, or lentils. Unsalted nuts and  seeds. Unsalted canned beans. Dairy Low-fat dairy products, such as skim or 1% milk, 2% or reduced-fat cheeses, low-fat ricotta or cottage cheese, or plain low-fat yogurt. Low-sodium or reduced-sodium cheeses. Fats and Oils Tub margarines without trans fats. Light or reduced-fat mayonnaise and salad dressings (reduced sodium). Avocado. Safflower, olive, or canola oils. Natural peanut or almond butter. Other Unsalted popcorn and pretzels. The items listed above may not be a complete list of recommended foods or beverages. Contact your dietitian for more options. WHAT FOODS ARE NOT RECOMMENDED? Grains White bread. White pasta. White rice. Refined cornbread. Bagels and croissants. Crackers that contain trans fat. Vegetables Creamed or fried vegetables. Vegetables in a cheese sauce. Regular canned vegetables. Regular canned tomato sauce and paste. Regular tomato and vegetable juices. Fruits Dried fruits. Canned fruit in light or heavy syrup. Fruit juice. Meat and Other Protein Products Fatty cuts of meat. Ribs, chicken wings, bacon, sausage, bologna, salami, chitterlings, fatback, hot dogs, bratwurst, and packaged luncheon meats. Salted nuts and seeds. Canned beans with salt. Dairy Whole or 2% milk, cream, half-and-half, and cream cheese. Whole-fat or sweetened yogurt. Full-fat cheeses or blue cheese. Nondairy creamers and whipped toppings. Processed cheese, cheese spreads, or cheese curds. Condiments Onion and garlic salt, seasoned salt, table salt, and sea salt. Canned and packaged gravies. Worcestershire sauce. Tartar sauce. Barbecue sauce. Teriyaki sauce. Soy sauce, including reduced sodium. Steak sauce. Fish sauce. Oyster sauce. Cocktail sauce. Horseradish. Ketchup and mustard. Meat flavorings and tenderizers. Bouillon cubes. Hot sauce. Tabasco sauce. Marinades. Taco seasonings. Relishes. Fats and Oils Butter, stick   margarine, lard, shortening, ghee, and bacon fat. Coconut, palm kernel, or  palm oils. Regular salad dressings. Other Pickles and olives. Salted popcorn and pretzels. The items listed above may not be a complete list of foods and beverages to avoid. Contact your dietitian for more information. WHERE CAN I FIND MORE INFORMATION? National Heart, Lung, and Blood Institute: www.nhlbi.nih.gov/health/health-topics/topics/dash/ Document Released: 11/10/2011 Document Revised: 04/07/2014 Document Reviewed: 09/25/2013 ExitCare Patient Information 2015 [employer]. This information is not intended to replace advice given to you by your health care provider. Make sure you discuss any questions you have with your health care provider.  

## 2015-05-06 NOTE — Progress Notes (Addendum)
Subjective:   Bradley Graham is a 44 y.o. male who presents for an Initial Medicare Annual Wellness Visit. Bradley Graham is here today with his mother. He has a PMH of TOF s/p multiple repairs most recently  VSD closure and homograft replacement in 2007 At [facility], chronic renal failure, hx of atrial flutter, and CHf. See past medical history for more details. He is alert and oriented and talkative. He does have a speech impediment and he is able to answer questions regarding his care. He lives with his mother and father and his mother reports that he drives and she tries to help him  to have as much impendence as possible.  Review of Systems     Objective:    Today's Vitals   05/06/15 1109  BP: 138/81  Pulse: 91  Height: 5\' 4"  (1.626 m)  Weight: 173 lb 12.8 oz (78.835 kg)    Current Medications (verified) Outpatient Encounter Prescriptions as of 05/06/2015  Medication Sig  . aspirin 81 MG tablet Take 81 mg by mouth daily.  . Cholecalciferol (VITAMIN D) 2000 UNITS CAPS Take 1 capsule by mouth daily.  Marland Kitchen docusate sodium (COLACE) 100 MG capsule Take 100 mg by mouth daily.  . fluticasone (FLONASE) 50 MCG/ACT nasal spray Place 1 spray into both nostrils 2 (two) times daily.  Marland Kitchen loratadine (CLARITIN) 10 MG tablet Take 10 mg by mouth daily.  Marland Kitchen lovastatin (MEVACOR) 20 MG tablet Take 20 mg by mouth at bedtime.   . pantoprazole (PROTONIX) 20 MG tablet Take 20 mg by mouth daily.  . valsartan (DIOVAN) 40 MG tablet Take 40 mg by mouth daily.  . [DISCONTINUED] cefdinir (OMNICEF) 300 MG capsule Take 1 capsule (300 mg total) by mouth 2 (two) times daily. (Patient not taking: Reported on 05/06/2015)  . [DISCONTINUED] enalapril (VASOTEC) 5 MG tablet 5 mg 2 (two) times daily.   . [DISCONTINUED] HYDROcodone-homatropine (HYCODAN) 5-1.5 MG/5ML syrup Take 5 mLs by mouth every 6 (six) hours as needed for cough. (Patient not taking: Reported on 05/06/2015)  . [DISCONTINUED] pantoprazole (PROTONIX) 40 MG tablet Take 40 mg by  mouth daily.   . [DISCONTINUED] predniSONE (DELTASONE) 20 MG tablet 2 po at sametime daily for 5 days (Patient not taking: Reported on 05/06/2015)   No facility-administered encounter medications on file as of 05/06/2015.    Allergies (verified) Aleve; Bactrim; Erythromycin; Heparin; Nsaids; Penicillins; and Cortisporin   History: Past Medical History  Diagnosis Date  . Club foot of both feet   . Speech impediment   . Tetralogy of Fallot     s/p waterson shunt  . CKD (chronic kidney disease)   . Left ventricular hypertrophy   . Right ventricular hypertrophy   . CHF (congestive heart failure)   . Hypertension   . Atrial fibrillation   . Vitamin D deficiency    Past Surgical History  Procedure Laterality Date  . Vsd repair    . Scoliosis    . Adenoidectomy    . Mouth surgery    . Wrist surgery Left    Family History  Problem Relation Age of Onset  . Diabetes Mother   . Hyperlipidemia Mother   . Hyperthyroidism Mother   . Diabetes Father   . Hyperlipidemia Father   . Hypertension Father   . Hyperthyroidism Sister    Social History   Occupational History  . Not on file.   Social History Main Topics  . Smoking status: Never Smoker   . Smokeless tobacco: Never  Used  . Alcohol Use: No  . Drug Use: No  . Sexual Activity: No   Tobacco Counseling NA   Activities of Daily Living In your present state of health, do you have any difficulty performing the following activities: 05/06/2015  Hearing? Y  Vision? Y  Difficulty concentrating or making decisions? N  Walking or climbing stairs? N  Dressing or bathing? N  Doing errands, shopping? N  Preparing Food and eating ? N  Using the Toilet? N  In the past six months, have you accidently leaked urine? N  Do you have problems with loss of bowel control? N  Managing your Medications? N  Managing your Finances? Y  Housekeeping or managing your Housekeeping? N    Immunizations and Health Maintenance  There is no  immunization history on file for this patient. Health Maintenance Due  Topic Date Due  . HIV Screening  04/13/1986    Patient Care Team: Chevis Pretty, FNP as PCP - General (Nurse Practitioner) Wilfrid Lund, MD (Otolaryngology) Ophelia Shoulder (Pulmonary Disease) Linzie Collin, MD as Referring Physician (Otolaryngology) Mercie Eon, MD (Internal Medicine) Thurman Coyer, DO as Consulting Physician (Sports Medicine)  Indicate any recent Medical Services you may have received from other than [facility] providers in the past year (date may be approximate).    Assessment:   This is a routine wellness examination for Bradley Graham.   Hearing/Vision screen He is followed by [facility] Dr Oletta Cohn and Dr. Vicente Masson for his hearing. He sees Dr Truman Hayward at [facility] for his vision and his mother will call for an appointment when he is due.  Dietary issues and exercise activities discussed: Current Exercise Habits:: Home exercise routine, Type of exercise: walking;strength training/weights, Time (Minutes): 60, Frequency (Times/Week): 4, Weekly Exercise (Minutes/Week): 240, Intensity: Mild  Goals    None     Depression Screen PHQ 2/9 Scores 05/06/2015 02/23/2015 11/03/2014 04/08/2014  PHQ - 2 Score 0 0 0 0    Fall Risk Fall Risk  05/06/2015 02/23/2015 11/03/2014 04/08/2014  Falls in the past year? No No No No    Cognitive Function: MMSE - Mini Mental State Exam 05/06/2015  Not completed: Unable to complete    Screening Tests Health Maintenance  Topic Date Due  . HIV Screening  04/13/1986  . TETANUS/TDAP  12/07/2015 (Originally 04/13/1990)  . INFLUENZA VACCINE  07/06/2015        Plan:   Continue good heart healthy diet with therapeutic lifestyle changes  Continue to go to the office next door and continue with his weight training. Continue to walk at the mall with his friends Continue current medications as ordered Keep follow up appointments with his cardiologist,pulmonologist,  and nephrologist. Recommend Tdap and Prevnar 13 due to cardiac congential conditions and surgery mother to check with Nephrologist and give me a call if ok to administer.   During the course of the visit Bradley Graham was educated and counseled about the following appropriate screening and preventive services:   Vaccines to include Pneumoccal, Influenza, Tdap : mother will check with his nephrologist first before we administer Tdap and prevnar. She will give me a call if it's ok   Electrocardiogram last noted 01/2012 pt is followed by  Mercie Eon at [facility] last visit 11/2014   Cardiovascular disease screening pt is followed by  Mercie Eon at [facility] last visit 11/2014  Glaucoma screening followed by Dr Truman Hayward at [place] will call for an appointment  Nutrition  counseling Cardiac prudent diet /DASH diet  Smoking cessation counseling NA   Patient Instructions (the written plan) were given to the patient.   Joneen Boers, RN   05/06/2015       I have reviewed and agree with the above AWV documentation.  [name], M.D.

## 2015-07-20 ENCOUNTER — Ambulatory Visit (INDEPENDENT_AMBULATORY_CARE_PROVIDER_SITE_OTHER): Payer: Medicare Other | Admitting: Physician Assistant

## 2015-07-20 ENCOUNTER — Ambulatory Visit (INDEPENDENT_AMBULATORY_CARE_PROVIDER_SITE_OTHER): Payer: Medicare Other

## 2015-07-20 VITALS — BP 182/94 | HR 98 | Temp 98.5°F | Resp 22

## 2015-07-20 DIAGNOSIS — M79671 Pain in right foot: Secondary | ICD-10-CM | POA: Diagnosis not present

## 2015-07-20 LAB — POCT CBC
GRANULOCYTE PERCENT: 74.8 % (ref 37–80)
HEMATOCRIT: 43.6 % (ref 43.5–53.7)
Hemoglobin: 13.4 g/dL — AB (ref 14.1–18.1)
LYMPH, POC: 2.2 (ref 0.6–3.4)
MCH, POC: 28.5 pg (ref 27–31.2)
MCHC: 30.8 g/dL — AB (ref 31.8–35.4)
MCV: 92.5 fL (ref 80–97)
MID (CBC): 0.5 (ref 0–0.9)
MPV: 9 fL (ref 0–99.8)
PLATELET COUNT, POC: 122 10*3/uL — AB (ref 142–424)
POC Granulocyte: 7.9 — AB (ref 2–6.9)
POC LYMPH %: 20.6 % (ref 10–50)
POC MID %: 4.6 %M (ref 0–12)
RBC: 4.71 M/uL (ref 4.69–6.13)
RDW, POC: 15 %
WBC: 10.5 10*3/uL — AB (ref 4.6–10.2)

## 2015-07-20 MED ORDER — TRAMADOL HCL 50 MG PO TABS
50.0000 mg | ORAL_TABLET | Freq: Three times a day (TID) | ORAL | Status: DC | PRN
Start: 2015-07-20 — End: 2016-05-13

## 2015-07-20 NOTE — Progress Notes (Signed)
Subjective:    Patient ID: Bradley Graham, male    DOB: 09-28-1971, 44 y.o.   MRN: [id number]  HPI Patient presents for right foot pain that has been present for the past 3 days. States that pain got worse today after hitting foot on bedpost, but no initial trauma. Only difference he recalls is increasing speed of treadmill last week. Pain starts in arch and radiates to big toe. Pain worse with walking and now using a cane for support. Denies numbness or loss of sensation. Denies h/o gout.  Allergies  Allergen Reactions  . Aleve [Naproxen Sodium]   . Bactrim [Sulfamethoxazole-Trimethoprim]   . Erythromycin   . Heparin     Induced thrombopenia  . Nsaids   . Penicillins   . Cortisporin [Bacitra-Neomycin-Polymyxin-Hc] Other (See Comments)    Burned ear canal per mom   Review of Systems  Constitutional: Negative.  Negative for fever and chills.  Musculoskeletal: Positive for arthralgias and gait problem. Negative for joint swelling.  Skin: Negative.   Neurological: Negative for weakness and numbness.       Objective:   Physical Exam  Constitutional: He is oriented to person, place, and time. He appears well-developed and well-nourished. No distress.  Blood pressure 182/94, pulse 108, temperature 98.5 F (36.9 C), resp. rate 22, SpO2 97 %.   HENT:  Head: Normocephalic and atraumatic.  Right Ear: External ear normal.  Left Ear: External ear normal.  Eyes: Conjunctivae are normal. Right eye exhibits no discharge. Left eye exhibits no discharge. No scleral icterus.  Pulmonary/Chest: Effort normal.  Musculoskeletal: He exhibits tenderness. He exhibits no edema.       Right ankle: Normal.       Right foot: There is tenderness. There is normal range of motion, no bony tenderness, no swelling, normal capillary refill, no crepitus, no deformity and no laceration.       Feet:  Neurological: He is alert and oriented to person, place, and time. He has normal reflexes. No cranial nerve  deficit. He exhibits normal muscle tone.  Skin: Skin is warm and dry. No rash noted. He is not diaphoretic. No erythema. No pallor.  Psychiatric: He has a normal mood and affect. His behavior is normal. Judgment and thought content normal.   [facility] reading (PRIMARY) by  Dr. Ouida Sills. No acute bony abnormalities.  Results for orders placed or performed in visit on 07/20/15  POCT CBC  Result Value Ref Range   WBC 10.5 (A) 4.6 - 10.2 K/uL   Lymph, poc 2.2 0.6 - 3.4   POC LYMPH PERCENT 20.6 10 - 50 %L   MID (cbc) 0.5 0 - 0.9   POC MID % 4.6 0 - 12 %M   POC Granulocyte 7.9 (A) 2 - 6.9   Granulocyte percent 74.8 37 - 80 %G   RBC 4.71 4.69 - 6.13 M/uL   Hemoglobin 13.4 (A) 14.1 - 18.1 g/dL   HCT, POC 43.6 43.5 - 53.7 %   MCV 92.5 80 - 97 fL   MCH, POC 28.5 27 - 31.2 pg   MCHC 30.8 (A) 31.8 - 35.4 g/dL   RDW, POC 15.0 %   Platelet Count, POC 122 (A) 142 - 424 K/uL   MPV 9.0 0 - 99.8 fL      Assessment & Plan:  1. Right foot pain Post-op shoe given. Should wear during activity. Should avoid treadmill until foot feels better.  - POCT CBC - DG Foot Complete Right; Future - traMADol (  ULTRAM) 50 MG tablet; Take 1 tablet (50 mg total) by mouth every 8 (eight) hours as needed.  Dispense: 30 tablet; Refill: 0   Texanna Hilburn PA-C  [facility] 07/20/2015 8:25 PM

## 2015-07-20 NOTE — Progress Notes (Signed)
  Medical screening examination/treatment/procedure(s) were performed by non-physician practitioner and as supervising physician I was immediately available for consultation/collaboration.     

## 2015-07-23 ENCOUNTER — Telehealth: Payer: Self-pay

## 2015-07-23 NOTE — Telephone Encounter (Signed)
Pt's mother LM about lab results. Called pt and LMOM letting him know that all we did was a CBC in office and that if he had any questions, to give [place] a all back.

## 2015-07-27 ENCOUNTER — Telehealth: Payer: Self-pay

## 2015-07-27 NOTE — Telephone Encounter (Signed)
Did you want to add a uric acid level.  I think this is what they may be speaking of?

## 2015-07-27 NOTE — Telephone Encounter (Signed)
Bradley Graham states her son had an acid level test done and it isn't in [facility] yet and they have been calling,calling and cant get anyone to call them back and they really need to know Please call [phone]

## 2015-07-28 ENCOUNTER — Telehealth: Payer: Self-pay | Admitting: Family Medicine

## 2015-07-28 NOTE — Telephone Encounter (Signed)
Left vmail for Dadrian. Also returned mother's call. CBC was normal. Explained to mom that uric acid lab was cancelled as there was not enough blood to run test. She declines bringing patient back in for additional draw. Says that patient did get worse in regards to pain, however, is better for the past 24 hrs.

## 2015-07-28 NOTE — Telephone Encounter (Signed)
Please respond to patient labs mother is calling wondering why his labs not showing up in [facility] and that the patients foot isn't any better please call patient

## 2015-09-29 DIAGNOSIS — H903 Sensorineural hearing loss, bilateral: Secondary | ICD-10-CM | POA: Diagnosis not present

## 2015-09-29 DIAGNOSIS — H6980 Other specified disorders of Eustachian tube, unspecified ear: Secondary | ICD-10-CM | POA: Diagnosis not present

## 2015-09-29 DIAGNOSIS — K1379 Other lesions of oral mucosa: Secondary | ICD-10-CM | POA: Diagnosis not present

## 2015-09-29 DIAGNOSIS — H9210 Otorrhea, unspecified ear: Secondary | ICD-10-CM | POA: Diagnosis not present

## 2015-09-29 DIAGNOSIS — H6123 Impacted cerumen, bilateral: Secondary | ICD-10-CM | POA: Diagnosis not present

## 2015-10-08 DIAGNOSIS — Z6830 Body mass index (BMI) 30.0-30.9, adult: Secondary | ICD-10-CM | POA: Diagnosis not present

## 2015-10-08 DIAGNOSIS — I509 Heart failure, unspecified: Secondary | ICD-10-CM | POA: Diagnosis not present

## 2015-10-08 DIAGNOSIS — I4891 Unspecified atrial fibrillation: Secondary | ICD-10-CM | POA: Diagnosis not present

## 2015-10-08 DIAGNOSIS — E785 Hyperlipidemia, unspecified: Secondary | ICD-10-CM | POA: Diagnosis not present

## 2015-10-08 DIAGNOSIS — N179 Acute kidney failure, unspecified: Secondary | ICD-10-CM | POA: Diagnosis not present

## 2015-10-08 DIAGNOSIS — Z9889 Other specified postprocedural states: Secondary | ICD-10-CM | POA: Diagnosis not present

## 2015-10-08 DIAGNOSIS — D631 Anemia in chronic kidney disease: Secondary | ICD-10-CM | POA: Diagnosis not present

## 2015-10-08 DIAGNOSIS — E663 Overweight: Secondary | ICD-10-CM | POA: Diagnosis not present

## 2015-10-08 DIAGNOSIS — I129 Hypertensive chronic kidney disease with stage 1 through stage 4 chronic kidney disease, or unspecified chronic kidney disease: Secondary | ICD-10-CM | POA: Diagnosis not present

## 2015-10-08 DIAGNOSIS — I130 Hypertensive heart and chronic kidney disease with heart failure and stage 1 through stage 4 chronic kidney disease, or unspecified chronic kidney disease: Secondary | ICD-10-CM | POA: Diagnosis not present

## 2015-10-08 DIAGNOSIS — Z79899 Other long term (current) drug therapy: Secondary | ICD-10-CM | POA: Diagnosis not present

## 2015-10-08 DIAGNOSIS — I272 Other secondary pulmonary hypertension: Secondary | ICD-10-CM | POA: Diagnosis not present

## 2015-10-08 DIAGNOSIS — N183 Chronic kidney disease, stage 3 (moderate): Secondary | ICD-10-CM | POA: Diagnosis not present

## 2015-10-08 DIAGNOSIS — Z8709 Personal history of other diseases of the respiratory system: Secondary | ICD-10-CM | POA: Diagnosis not present

## 2015-10-08 DIAGNOSIS — Z23 Encounter for immunization: Secondary | ICD-10-CM | POA: Diagnosis not present

## 2015-10-08 DIAGNOSIS — Z8774 Personal history of (corrected) congenital malformations of heart and circulatory system: Secondary | ICD-10-CM | POA: Diagnosis not present

## 2015-10-08 DIAGNOSIS — Z8719 Personal history of other diseases of the digestive system: Secondary | ICD-10-CM | POA: Diagnosis not present

## 2015-10-20 ENCOUNTER — Ambulatory Visit (INDEPENDENT_AMBULATORY_CARE_PROVIDER_SITE_OTHER): Payer: Medicare Other | Admitting: Family Medicine

## 2015-10-20 ENCOUNTER — Encounter: Payer: Self-pay | Admitting: Family Medicine

## 2015-10-20 VITALS — BP 153/88 | HR 96 | Temp 98.3°F | Ht 64.0 in | Wt 179.0 lb

## 2015-10-20 DIAGNOSIS — J209 Acute bronchitis, unspecified: Secondary | ICD-10-CM

## 2015-10-20 MED ORDER — AZITHROMYCIN 250 MG PO TABS: ORAL_TABLET | ORAL | Status: DC

## 2015-10-20 NOTE — Patient Instructions (Signed)
Drink plenty of fluids Take Mucinex, maximum strength, blue and white in color, 1 twice daily with a large glass of water for cough and congestion Use nasal saline over-the-counter 1 spray each nostril at least 3 or 4 times daily Take antibiotic as directed Take Tylenol as needed for aches pains and fever

## 2015-10-20 NOTE — Progress Notes (Signed)
Subjective:    Patient ID: Bradley Graham, male    DOB: [birth year], 44 y.o.   MRN: [id number]  HPI Patient here today with complaints of: feeling hot, fatigue, cough, yellow congestion, and nasal drainage since 11/12. Patient received flu shot on 11/3. The family also had some visitors that was sick prior to the patient getting sick.   Patient Active Problem List   Diagnosis Date Noted  . Posterior tibialis tendon insufficiency 02/23/2015  . Incisional hernia, without obstruction or gangrene 02/28/2014  . 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 Vitamin D deficiency 06/25/2013  . CKD (chronic kidney disease) 06/25/2013  . S/P TOF (tetralogy of Fallot) repair 06/25/2013  . Speech impediment secondary to organic lesion 06/25/2013   Outpatient Encounter Prescriptions as of 10/20/2015  Medication Sig  . aspirin 81 MG tablet Take 81 mg by mouth daily.  . Cholecalciferol (VITAMIN D) 2000 UNITS CAPS Take 1 capsule by mouth daily.  Marland Kitchen docusate sodium (COLACE) 100 MG capsule Take 100 mg by mouth daily.  . fluticasone (FLONASE) 50 MCG/ACT nasal spray Place 1 spray into both nostrils 2 (two) times daily.  Marland Kitchen loratadine (CLARITIN) 10 MG tablet Take 10 mg by mouth daily.  Marland Kitchen lovastatin (MEVACOR) 20 MG tablet Take 20 mg by mouth at bedtime.   . pantoprazole (PROTONIX) 20 MG tablet Take 20 mg by mouth daily.  . traMADol (ULTRAM) 50 MG tablet Take 1 tablet (50 mg total) by mouth every 8 (eight) hours as needed.  . valsartan (DIOVAN) 40 MG tablet Take 40 mg by mouth daily.   No facility-administered encounter medications on file as of 10/20/2015.       Review of Systems  Constitutional: Positive for fatigue.  HENT: Positive for congestion and postnasal drip.   Eyes: Negative.   Respiratory: Positive for cough.   Cardiovascular: Negative.   Gastrointestinal: Negative.   Endocrine: Negative.   Genitourinary: Negative.   Musculoskeletal: Negative.   Skin: Negative.     Allergic/Immunologic: Negative.   Neurological: Negative.   Hematological: Negative.   Psychiatric/Behavioral: Negative.        Objective:   Physical Exam  Constitutional: He is oriented to person, place, and time. He appears well-developed and well-nourished. No distress.  HENT:  Head: Normocephalic and atraumatic.  Right Ear: External ear normal.  Left Ear: External ear normal.  Mouth/Throat: Oropharynx is clear and moist. No oropharyngeal exudate.  Some nasal congestion bilaterally  Eyes: Conjunctivae and EOM are normal. Pupils are equal, round, and reactive to light. Right eye exhibits no discharge. Left eye exhibits no discharge. No scleral icterus.  Neck: Normal range of motion. Neck supple. No thyromegaly present.  No anterior cervical nodes  Cardiovascular: Normal rate, regular rhythm, normal heart sounds and intact distal pulses.  Exam reveals no gallop and no friction rub.   No murmur heard. Pulmonary/Chest: Effort normal. No respiratory distress. He has no wheezes. He has no rales. He exhibits no tenderness.  Dry irritated upper airway cough  Abdominal: He exhibits no mass.  Musculoskeletal: Normal range of motion.  Lymphadenopathy:    He has no cervical adenopathy.  Neurological: He is alert and oriented to person, place, and time.  Skin: Skin is warm and dry. No rash noted.  Psychiatric: He has a normal mood and affect. His behavior is normal. Judgment and thought content normal.  Nursing note and vitals reviewed.  BP 153/88 mmHg  Pulse 96  Temp(Src) 98.3 F (36.8 C) (Oral)  Ht 5\' 4"  (1.626 m)  Wt 179 lb (81.194 kg)  BMI 30.71 kg/m2        Assessment & Plan:  1. Acute bronchitis, unspecified organism -Drink plenty of fluids and take Mucinex and use nasal saline as directed  Meds ordered this encounter  Medications  . azithromycin (ZITHROMAX) 250 MG tablet    Sig: As directed    Dispense:  6 tablet    Refill:  0   Patient Instructions  Drink  plenty of fluids Take Mucinex, maximum strength, blue and white in color, 1 twice daily with a large glass of water for cough and congestion Use nasal saline over-the-counter 1 spray each nostril at least 3 or 4 times daily Take antibiotic as directed Take Tylenol as needed for aches pains and fever   Arrie Senate MD

## 2015-11-20 DIAGNOSIS — N183 Chronic kidney disease, stage 3 (moderate): Secondary | ICD-10-CM | POA: Diagnosis not present

## 2015-11-20 DIAGNOSIS — Z7982 Long term (current) use of aspirin: Secondary | ICD-10-CM | POA: Diagnosis not present

## 2015-11-20 DIAGNOSIS — Z79899 Other long term (current) drug therapy: Secondary | ICD-10-CM | POA: Diagnosis not present

## 2015-11-20 DIAGNOSIS — Q255 Atresia of pulmonary artery: Secondary | ICD-10-CM | POA: Diagnosis not present

## 2015-11-20 DIAGNOSIS — Z992 Dependence on renal dialysis: Secondary | ICD-10-CM | POA: Diagnosis not present

## 2015-11-20 DIAGNOSIS — I440 Atrioventricular block, first degree: Secondary | ICD-10-CM | POA: Diagnosis not present

## 2015-11-20 DIAGNOSIS — Z9889 Other specified postprocedural states: Secondary | ICD-10-CM | POA: Diagnosis not present

## 2015-11-20 DIAGNOSIS — Q213 Tetralogy of Fallot: Secondary | ICD-10-CM | POA: Diagnosis not present

## 2015-11-20 DIAGNOSIS — I4510 Unspecified right bundle-branch block: Secondary | ICD-10-CM | POA: Diagnosis not present

## 2015-12-01 DIAGNOSIS — I083 Combined rheumatic disorders of mitral, aortic and tricuspid valves: Secondary | ICD-10-CM | POA: Diagnosis not present

## 2015-12-01 DIAGNOSIS — I517 Cardiomegaly: Secondary | ICD-10-CM | POA: Diagnosis not present

## 2015-12-01 DIAGNOSIS — Z95818 Presence of other cardiac implants and grafts: Secondary | ICD-10-CM | POA: Diagnosis not present

## 2015-12-01 DIAGNOSIS — I77810 Thoracic aortic ectasia: Secondary | ICD-10-CM | POA: Diagnosis not present

## 2015-12-01 DIAGNOSIS — Q213 Tetralogy of Fallot: Secondary | ICD-10-CM | POA: Diagnosis not present

## 2015-12-01 DIAGNOSIS — I371 Nonrheumatic pulmonary valve insufficiency: Secondary | ICD-10-CM | POA: Diagnosis not present

## 2016-01-18 ENCOUNTER — Encounter: Payer: Self-pay | Admitting: Nurse Practitioner

## 2016-01-18 ENCOUNTER — Ambulatory Visit (INDEPENDENT_AMBULATORY_CARE_PROVIDER_SITE_OTHER): Payer: Medicare Other | Admitting: Nurse Practitioner

## 2016-01-18 VITALS — BP 160/88 | HR 97 | Temp 97.9°F | Ht 64.0 in | Wt 183.0 lb

## 2016-01-18 DIAGNOSIS — J069 Acute upper respiratory infection, unspecified: Secondary | ICD-10-CM | POA: Diagnosis not present

## 2016-01-18 MED ORDER — AZITHROMYCIN 250 MG PO TABS: ORAL_TABLET | ORAL | Status: DC

## 2016-01-18 NOTE — Progress Notes (Signed)
  Subjective:     Bradley Graham is a 45 y.o. male here for evaluation of a cough. Onset of symptoms was 4 days ago. Symptoms have been gradually worsening since that time. The cough is barky, hoarse, nonproductive and raspy and is aggravated by nothing. Associated symptoms include: change in voice and wheezing. Patient does not have a history of asthma. Patient does not have a history of environmental allergens. Patient has not traveled recently. Patient does not have a history of smoking. Patient has had a previous chest x-ray. Patient has not had a PPD done.  The following portions of the patient's history were reviewed and updated as appropriate: allergies, current medications, past family history, past medical history, past social history, past surgical history and problem list.  Review of Systems Pertinent items are noted in HPI.    Objective:    BP 160/88 mmHg  Pulse 97  Temp(Src) 97.9 F (36.6 C) (Oral)  Ht 5\' 4"  (1.626 m)  Wt 183 lb (83.008 kg)  BMI 31.40 kg/m2 General appearance: alert and cooperative Ears: normal TM's and external ear canals both ears Nose: clear discharge, moderate congestion, turbinates red, sinus tenderness bilateral Throat: lips, mucosa, and tongue normal; teeth and gums normal Neck: no adenopathy, no carotid bruit, no JVD, supple, symmetrical, trachea midline and thyroid not enlarged, symmetric, no tenderness/mass/nodules Lungs: clear to auscultation bilaterally and dry cough Heart: regular rate and rhythm, S1, S2 normal, no murmur, click, rub or gallop    Assessment:    URI with Post Nasal Drip    Plan:   1. Take meds as prescribed 2. Use a cool mist humidifier especially during the winter months and when heat has been humid. 3. Use saline nose sprays frequently 4. Saline irrigations of the nose can be very helpful if done frequently.  * 4X daily for 1 week*  * Use of a nettie pot can be helpful with this. Follow directions with this* 5. Drink  plenty of fluids 6. Keep thermostat turn down low 7.For any cough or congestion  Use plain Mucinex- regular strength or max strength is fine   * Children- consult with Pharmacist for dosing 8. For fever or aces or pains- take tylenol or ibuprofen appropriate for age and weight.  * for fevers greater than 101 orally you may alternate ibuprofen and tylenol every  3 hours.   Meds ordered this encounter  Medications  . azithromycin (ZITHROMAX Z-PAK) 250 MG tablet    Sig: As directed    Dispense:  1 each    Refill:  0    Order Specific Question:  Supervising Provider    Answer:  Chipper Herb [[id number]]   [place], FNP

## 2016-01-18 NOTE — Patient Instructions (Signed)

## 2016-02-02 ENCOUNTER — Other Ambulatory Visit: Payer: Self-pay | Admitting: Nurse Practitioner

## 2016-03-29 DIAGNOSIS — H903 Sensorineural hearing loss, bilateral: Secondary | ICD-10-CM | POA: Diagnosis not present

## 2016-03-29 DIAGNOSIS — H6123 Impacted cerumen, bilateral: Secondary | ICD-10-CM | POA: Diagnosis not present

## 2016-04-05 DIAGNOSIS — I129 Hypertensive chronic kidney disease with stage 1 through stage 4 chronic kidney disease, or unspecified chronic kidney disease: Secondary | ICD-10-CM | POA: Diagnosis not present

## 2016-04-05 DIAGNOSIS — D631 Anemia in chronic kidney disease: Secondary | ICD-10-CM | POA: Diagnosis not present

## 2016-04-05 DIAGNOSIS — N183 Chronic kidney disease, stage 3 (moderate): Secondary | ICD-10-CM | POA: Diagnosis not present

## 2016-04-05 DIAGNOSIS — E785 Hyperlipidemia, unspecified: Secondary | ICD-10-CM | POA: Diagnosis not present

## 2016-04-06 DIAGNOSIS — Z7982 Long term (current) use of aspirin: Secondary | ICD-10-CM | POA: Diagnosis not present

## 2016-04-06 DIAGNOSIS — Z8349 Family history of other endocrine, nutritional and metabolic diseases: Secondary | ICD-10-CM | POA: Diagnosis not present

## 2016-04-06 DIAGNOSIS — Z9889 Other specified postprocedural states: Secondary | ICD-10-CM | POA: Diagnosis not present

## 2016-04-06 DIAGNOSIS — Z9049 Acquired absence of other specified parts of digestive tract: Secondary | ICD-10-CM | POA: Diagnosis not present

## 2016-04-06 DIAGNOSIS — I509 Heart failure, unspecified: Secondary | ICD-10-CM | POA: Diagnosis not present

## 2016-04-06 DIAGNOSIS — K432 Incisional hernia without obstruction or gangrene: Secondary | ICD-10-CM | POA: Diagnosis not present

## 2016-04-06 DIAGNOSIS — Z833 Family history of diabetes mellitus: Secondary | ICD-10-CM | POA: Diagnosis not present

## 2016-04-06 DIAGNOSIS — Z8249 Family history of ischemic heart disease and other diseases of the circulatory system: Secondary | ICD-10-CM | POA: Diagnosis not present

## 2016-04-06 DIAGNOSIS — Z79899 Other long term (current) drug therapy: Secondary | ICD-10-CM | POA: Diagnosis not present

## 2016-04-06 DIAGNOSIS — R0600 Dyspnea, unspecified: Secondary | ICD-10-CM | POA: Diagnosis not present

## 2016-04-07 DIAGNOSIS — I129 Hypertensive chronic kidney disease with stage 1 through stage 4 chronic kidney disease, or unspecified chronic kidney disease: Secondary | ICD-10-CM | POA: Diagnosis not present

## 2016-04-07 DIAGNOSIS — N990 Postprocedural (acute) (chronic) kidney failure: Secondary | ICD-10-CM | POA: Diagnosis not present

## 2016-04-07 DIAGNOSIS — Z79899 Other long term (current) drug therapy: Secondary | ICD-10-CM | POA: Diagnosis not present

## 2016-04-07 DIAGNOSIS — Z888 Allergy status to other drugs, medicaments and biological substances status: Secondary | ICD-10-CM | POA: Diagnosis not present

## 2016-04-07 DIAGNOSIS — N183 Chronic kidney disease, stage 3 (moderate): Secondary | ICD-10-CM | POA: Diagnosis not present

## 2016-04-07 DIAGNOSIS — Z880 Allergy status to penicillin: Secondary | ICD-10-CM | POA: Diagnosis not present

## 2016-04-07 DIAGNOSIS — Z881 Allergy status to other antibiotic agents status: Secondary | ICD-10-CM | POA: Diagnosis not present

## 2016-04-07 DIAGNOSIS — Z9889 Other specified postprocedural states: Secondary | ICD-10-CM | POA: Diagnosis not present

## 2016-05-13 ENCOUNTER — Encounter: Payer: Self-pay | Admitting: Family Medicine

## 2016-05-13 ENCOUNTER — Ambulatory Visit (INDEPENDENT_AMBULATORY_CARE_PROVIDER_SITE_OTHER): Payer: Medicare Other

## 2016-05-13 ENCOUNTER — Ambulatory Visit (INDEPENDENT_AMBULATORY_CARE_PROVIDER_SITE_OTHER): Payer: Medicare Other | Admitting: Family Medicine

## 2016-05-13 VITALS — BP 154/82 | HR 91 | Temp 97.7°F | Ht 64.0 in | Wt 176.8 lb

## 2016-05-13 DIAGNOSIS — S8992XA Unspecified injury of left lower leg, initial encounter: Secondary | ICD-10-CM | POA: Diagnosis not present

## 2016-05-13 DIAGNOSIS — S82142A Displaced bicondylar fracture of left tibia, initial encounter for closed fracture: Secondary | ICD-10-CM

## 2016-05-13 DIAGNOSIS — S82145A Nondisplaced bicondylar fracture of left tibia, initial encounter for closed fracture: Secondary | ICD-10-CM | POA: Diagnosis not present

## 2016-05-13 NOTE — Patient Instructions (Signed)
Great to meet you!  I am sorry he has injured his knee. For now lets continue to ice it and see how he heals. Try tylenol for pain.   PLease come back if he is not significantly getting better in a week or two or if he gets worse.

## 2016-05-13 NOTE — Addendum Note (Signed)
Addended by: Timmothy Euler on: 05/13/2016 03:05 PM   Modules accepted: Orders, SmartSet

## 2016-05-13 NOTE — Progress Notes (Addendum)
   HPI  Patient presents today with knee pain.  Patient explains that about 2 weeks ago he was at [employer] out of town, he stepped over a curb and tripped falling on his left knee. He's had pain and swelling since that time. Over the last few days he feels like it's beginning to "give out".  Been using ice which has helped some. He feels unstable on his feet, however he does not have severe pain with bearing weight.  He denies any locking or popping symptoms. He does feel like it might be trying to start locking up.   PMH: Smoking status noted ROS: Per HPI  Objective: BP 154/82 mmHg  Pulse 91  Temp(Src) 97.7 F (36.5 C) (Oral)  Ht 5\' 4"  (1.626 m)  Wt 176 lb 12.8 oz (80.196 kg)  BMI 30.33 kg/m2 Gen: NAD, alert, cooperative with exam HEENT: NCAT CV: RRR, good S1/S2, no murmur Resp: CTABL, no wheezes, non-labored Ext: No edema, warm Neuro: Alert and oriented, No gross deficits  MSK: L knee bruising and mild effusion No joint line tenderness.  ligamentously intact to Lachman's and with varus and valgus stress.  Negative McMurray's test   XR shows L tibial plateu fracture, small   Assessment and plan:  # Left knee injury L tibial plateua fracture Referring to ortho today for management.  He has been walking on this for about 2 weeks, I recommended minimizing walking until he is seen tonight.     Orders Placed This Encounter  Procedures  . DG Knee 1-2 Views Left    Standing Status: Future     Number of Occurrences: 1     Standing Expiration Date: 07/13/2017    Order Specific Question:  Reason for Exam (SYMPTOM  OR DIAGNOSIS REQUIRED)    Answer:  fall on L knee    Order Specific Question:  Preferred imaging location?    Answer:  External    Laroy Apple, MD [facility] 05/13/2016, 2:34 PM

## 2016-06-08 DIAGNOSIS — S82145D Nondisplaced bicondylar fracture of left tibia, subsequent encounter for closed fracture with routine healing: Secondary | ICD-10-CM | POA: Diagnosis not present

## 2016-06-27 ENCOUNTER — Ambulatory Visit: Payer: Medicare Other | Attending: Orthopedic Surgery | Admitting: Physical Therapy

## 2016-06-27 DIAGNOSIS — M6281 Muscle weakness (generalized): Secondary | ICD-10-CM | POA: Diagnosis not present

## 2016-06-27 DIAGNOSIS — M25562 Pain in left knee: Secondary | ICD-10-CM | POA: Insufficient documentation

## 2016-06-27 NOTE — Therapy (Signed)
[facility] [place], [place], [address] Phone: [phone]   Fax:  [phone]  Physical Therapy Treatment  Patient Details  Name: Bradley Graham MRN: [id number] Date of Birth: 1971-07-04 No Data Recorded  Encounter Date: 02/17/2015    Past Medical History:  Diagnosis Date  . Atrial fibrillation ([place])   . CHF (congestive heart failure) ([place])   . CKD (chronic kidney disease)   . Club foot of both feet   . Hypertension   . Left ventricular hypertrophy   . Right ventricular hypertrophy   . Speech impediment   . Tetralogy of Fallot    s/p waterson shunt  . Vitamin D deficiency     Past Surgical History:  Procedure Laterality Date  . ADENOIDECTOMY    . MOUTH SURGERY    . scoliosis    . VSD REPAIR    . WRIST SURGERY Left     There were no vitals filed for this visit.                                    PT Long Term Goals - 02/17/15 0939      PT LONG TERM GOAL #1   Title demonstrate and/or verbalize techniques to reduce the risk of re-injury to include info on: anti-inflammatory (RICE Method)   Period Weeks   Status Achieved     PT LONG TERM GOAL #2   Title be independent with advanced HEP   Period Weeks   Status Achieved  met 02-17-15     PT LONG TERM GOAL #3   Title perform ADL's with pain not > 3/10   Period Weeks   Status Achieved     PT LONG TERM GOAL #4   Title increase Right ankle strength to 4+/5 to provide good stability while walking   Time 6   Period Weeks   Status Achieved  4+/5     PT LONG TERM GOAL #5   Title increase left ankle active DF to 6 degrees   Time 6   Period Weeks   Status Not Met  AROM 4 degrees             Patient will benefit from skilled therapeutic intervention in order to improve the following deficits and impairments:     Visit Diagnosis: Left foot pain     Problem List Patient Active Problem List   Diagnosis Date  Noted  . Posterior tibialis tendon insufficiency 02/23/2015  . Incisional hernia, without obstruction or gangrene 02/28/2014  . 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 Vitamin D deficiency 06/25/2013  . CKD (chronic kidney disease) 06/25/2013  . S/P TOF (tetralogy of Fallot) repair 06/25/2013  . Speech impediment secondary to organic lesion 06/25/2013    [name], [place] 06/27/2016, 11:08 AM  [facility] [place], [place], [address] Phone: [phone]   Fax:  [phone]  Name: Bradley Graham MRN: [id number] Date of Birth: 09/21/1971

## 2016-06-27 NOTE — Therapy (Signed)
[facility] [place], [place], [address] Phone: [phone]   Fax:  [phone]  Physical Therapy Treatment  Patient Details  Name: Bradley Graham MRN: [id number] Date of Birth: [birth year] No Data Recorded  Encounter Date: 06/27/2016    Past Medical History:  Diagnosis Date  . Atrial fibrillation ([place])   . CHF (congestive heart failure) ([place])   . CKD (chronic kidney disease)   . Club foot of both feet   . Hypertension   . Left ventricular hypertrophy   . Right ventricular hypertrophy   . Speech impediment   . Tetralogy of Fallot    s/p waterson shunt  . Vitamin D deficiency     Past Surgical History:  Procedure Laterality Date  . ADENOIDECTOMY    . MOUTH SURGERY    . scoliosis    . VSD REPAIR    . WRIST SURGERY Left     There were no vitals filed for this visit.                                    PT Long Term Goals - 02/17/15 0939      PT LONG TERM GOAL #1   Title demonstrate and/or verbalize techniques to reduce the risk of re-injury to include info on: anti-inflammatory (RICE Method)   Period Weeks   Status Achieved     PT LONG TERM GOAL #2   Title be independent with advanced HEP   Period Weeks   Status Achieved  met 02-17-15     PT LONG TERM GOAL #3   Title perform ADL's with pain not > 3/10   Period Weeks   Status Achieved     PT LONG TERM GOAL #4   Title increase Right ankle strength to 4+/5 to provide good stability while walking   Time 6   Period Weeks   Status Achieved  4+/5     PT LONG TERM GOAL #5   Title increase left ankle active DF to 6 degrees   Time 6   Period Weeks   Status Not Met  AROM 4 degrees             Patient will benefit from skilled therapeutic intervention in order to improve the following deficits and impairments:     Visit Diagnosis: No diagnosis found.         Juliahna Wiswell, [place] MPT 06/27/2016, 11:04 AM  [facility] [address] [place], [place], [address] Phone: [phone]   Fax:  [phone]  Name: Bradley Graham MRN: [id number] Date of Birth: 14-Sep-1971

## 2016-06-27 NOTE — Therapy (Addendum)
[facility] [place], [place], [address] Phone: [phone]   Fax:  [phone]  Physical Therapy Evaluation  Patient Details  Name: Bradley Graham MRN: [id number] Date of Birth: 10-18-1971 Referring Provider: Marchia Bond MD.  Encounter Date: 06/27/2016      PT End of Session - 06/27/16 1234    Visit Number 1   Number of Visits 6   Date for PT Re-Evaluation 07/18/16      Past Medical History:  Diagnosis Date  . Atrial fibrillation ([place])   . CHF (congestive heart failure) ([place])   . CKD (chronic kidney disease)   . Club foot of both feet   . Hypertension   . Left ventricular hypertrophy   . Right ventricular hypertrophy   . Speech impediment   . Tetralogy of Fallot    s/p waterson shunt  . Vitamin D deficiency     Past Surgical History:  Procedure Laterality Date  . ADENOIDECTOMY    . MOUTH SURGERY    . scoliosis    . VSD REPAIR    . WRIST SURGERY Left     There were no vitals filed for this visit.       Subjective Assessment - 06/27/16 1110    Subjective I'm doing great.  I don't think I even need therapy.   Limitations Walking   Patient Stated Goals Get rid of my brace.   Currently in Pain? No/denies            [facility] PT Assessment - 06/27/16 0001      Assessment   Medical Diagnosis Left tibial plateau fracture.   Referring Provider Marchia Bond MD.   Onset Date/Surgical Date --  05/13/16.     Precautions   Required Braces or Orthoses --  Left neoprene brace.     Restrictions   Weight Bearing Restrictions No  WBAT.     Balance Screen   Has the patient fallen in the past 6 months Yes   How many times? --  Tripped.   Has the patient had a decrease in activity level because of a fear of falling?  No   Is the patient reluctant to leave their home because of a fear of falling?  No     Home Environment   Living Environment Private residence     Prior Function   Level of  Independence Independent     Observation/Other Assessments-Edema    Edema --  No left knee swelling.     Posture/Postural Control   Posture Comments --  Left knee genu valgum and genu recurvatum.     ROM / Strength   AROM / PROM / Strength AROM;Strength     AROM   Overall AROM  --  Normal left knee range of motion.     Strength   Overall Strength Comments Left hip and knee strength a solid 4+/5.     Palpation   Palpation comment --  No palpable tenderness.     Ambulation/Gait   Gait Comments Essentially normal gait pattern with patient using a cane but appears to no longer need it.                   [facility] Adult PT Treatment/Exercise - 06/27/16 0001      Exercises   Exercises Knee/Hip     Knee/Hip Exercises: Aerobic   Nustep Level 4 x 15 minutes.  PT Short Term Goals - 06/27/16 1223      PT SHORT TERM GOAL #1   Title STG=LTG's.   Time 2   Period Weeks   Status New           PT Long Term Goals - 06/27/16 1229      PT LONG TERM GOAL #3   Title Left LE strength= 5/5.   Time 4   Period Weeks   Status New               Plan - 06/27/16 1226    Clinical Impression Statement The patient fell on 05/13/16 while coming out of a restaurant.  An X-ray confirmed a left tibial plateau fracture.  He has now progressed to a cane and is wearing a neoprene brace on hs right knee.  He reports no pain and odes not feel like he will need much therapy.     Rehab Potential Excellent   PT Frequency 2x / week   PT Duration 3 weeks   PT Next Visit Plan Ther ex; Nustep; left LE strengthening; Try treadmill.      Patient will benefit from skilled therapeutic intervention in order to improve the following deficits and impairments:  Decreased activity tolerance, Decreased strength  Visit Diagnosis: Muscle weakness (generalized) - Plan: PT plan of care cert/re-cert      G-Codes - [id number]    Functional Assessment Tool Used  Clinical judgement.   Functional Limitation Mobility: Walking and moving around   Mobility: Walking and Moving Around Current Status [id number]) At least 20 percent but less than 40 percent impaired, limited or restricted   Mobility: Walking and Moving Around Goal Status [id number]) At least 1 percent but less than 20 percent impaired, limited or restricted       Problem List Patient Active Problem List   Diagnosis Date Noted  . Posterior tibialis tendon insufficiency 02/23/2015  . Incisional hernia, without obstruction or gangrene 02/28/2014  . 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 Vitamin D deficiency 06/25/2013  . CKD (chronic kidney disease) 06/25/2013  . S/P TOF (tetralogy of Fallot) repair 06/25/2013  . Speech impediment secondary to organic lesion 06/25/2013    Ameshia Pewitt, [place] MPT 06/27/2016, 12:40 PM  [facility] [place], [place], [address] Phone: [phone]   Fax:  [phone]  Name: Bradley Graham MRN: [id number] Date of Birth: 12-11-1970

## 2016-07-06 DIAGNOSIS — S82145D Nondisplaced bicondylar fracture of left tibia, subsequent encounter for closed fracture with routine healing: Secondary | ICD-10-CM | POA: Diagnosis not present

## 2016-08-01 ENCOUNTER — Other Ambulatory Visit: Payer: Self-pay | Admitting: Nurse Practitioner

## 2016-08-03 DIAGNOSIS — Z882 Allergy status to sulfonamides status: Secondary | ICD-10-CM | POA: Diagnosis not present

## 2016-08-03 DIAGNOSIS — Z880 Allergy status to penicillin: Secondary | ICD-10-CM | POA: Diagnosis not present

## 2016-08-03 DIAGNOSIS — Z888 Allergy status to other drugs, medicaments and biological substances status: Secondary | ICD-10-CM | POA: Diagnosis not present

## 2016-08-03 DIAGNOSIS — Z7982 Long term (current) use of aspirin: Secondary | ICD-10-CM | POA: Diagnosis not present

## 2016-08-03 DIAGNOSIS — Z9049 Acquired absence of other specified parts of digestive tract: Secondary | ICD-10-CM | POA: Diagnosis not present

## 2016-08-03 DIAGNOSIS — Z79899 Other long term (current) drug therapy: Secondary | ICD-10-CM | POA: Diagnosis not present

## 2016-08-03 DIAGNOSIS — Z9889 Other specified postprocedural states: Secondary | ICD-10-CM | POA: Diagnosis not present

## 2016-08-03 DIAGNOSIS — Z881 Allergy status to other antibiotic agents status: Secondary | ICD-10-CM | POA: Diagnosis not present

## 2016-08-03 DIAGNOSIS — K432 Incisional hernia without obstruction or gangrene: Secondary | ICD-10-CM | POA: Diagnosis not present

## 2016-08-09 IMAGING — DX DG KNEE 1-2V*L*
2 series · 2 of 2 positions shown · non-contrast
Comparison: None.

CLINICAL DATA: 45-year-old who fell and injured the left knee.
Initial encounter.

EXAM:
LEFT KNEE - 1-2 VIEW

[knee ap]
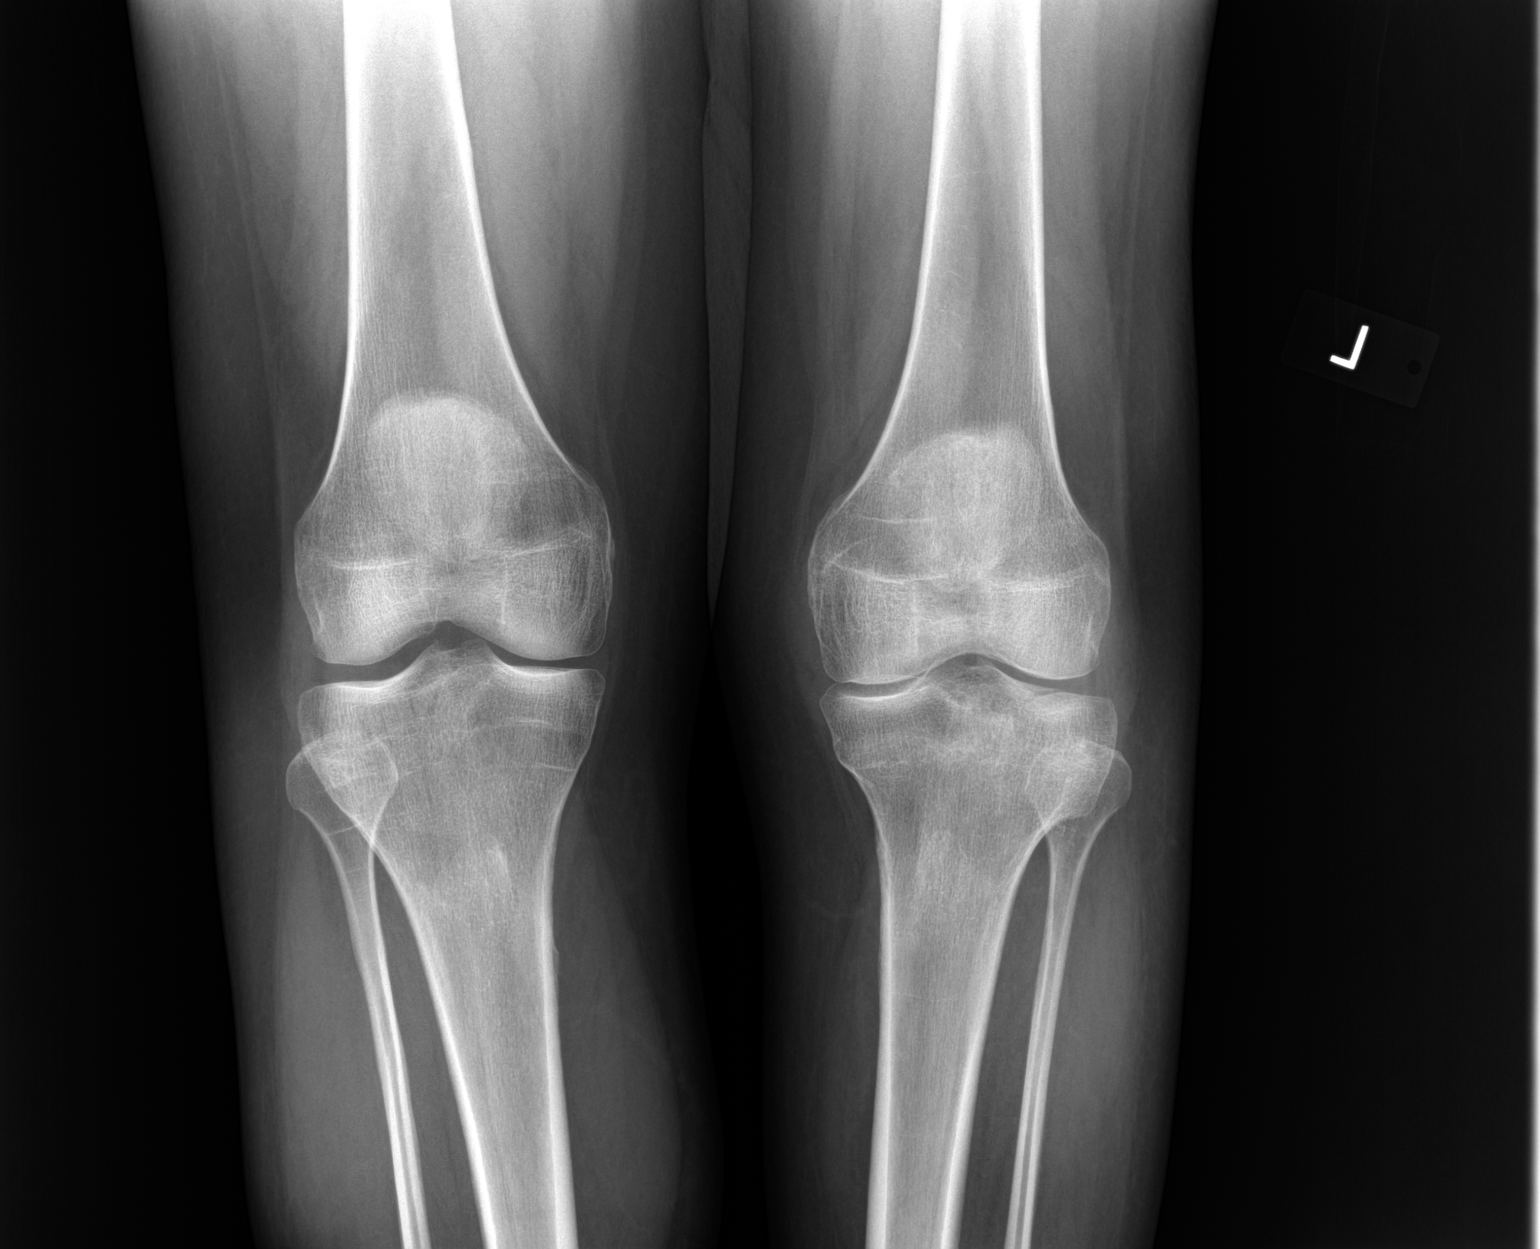

[knee lat]
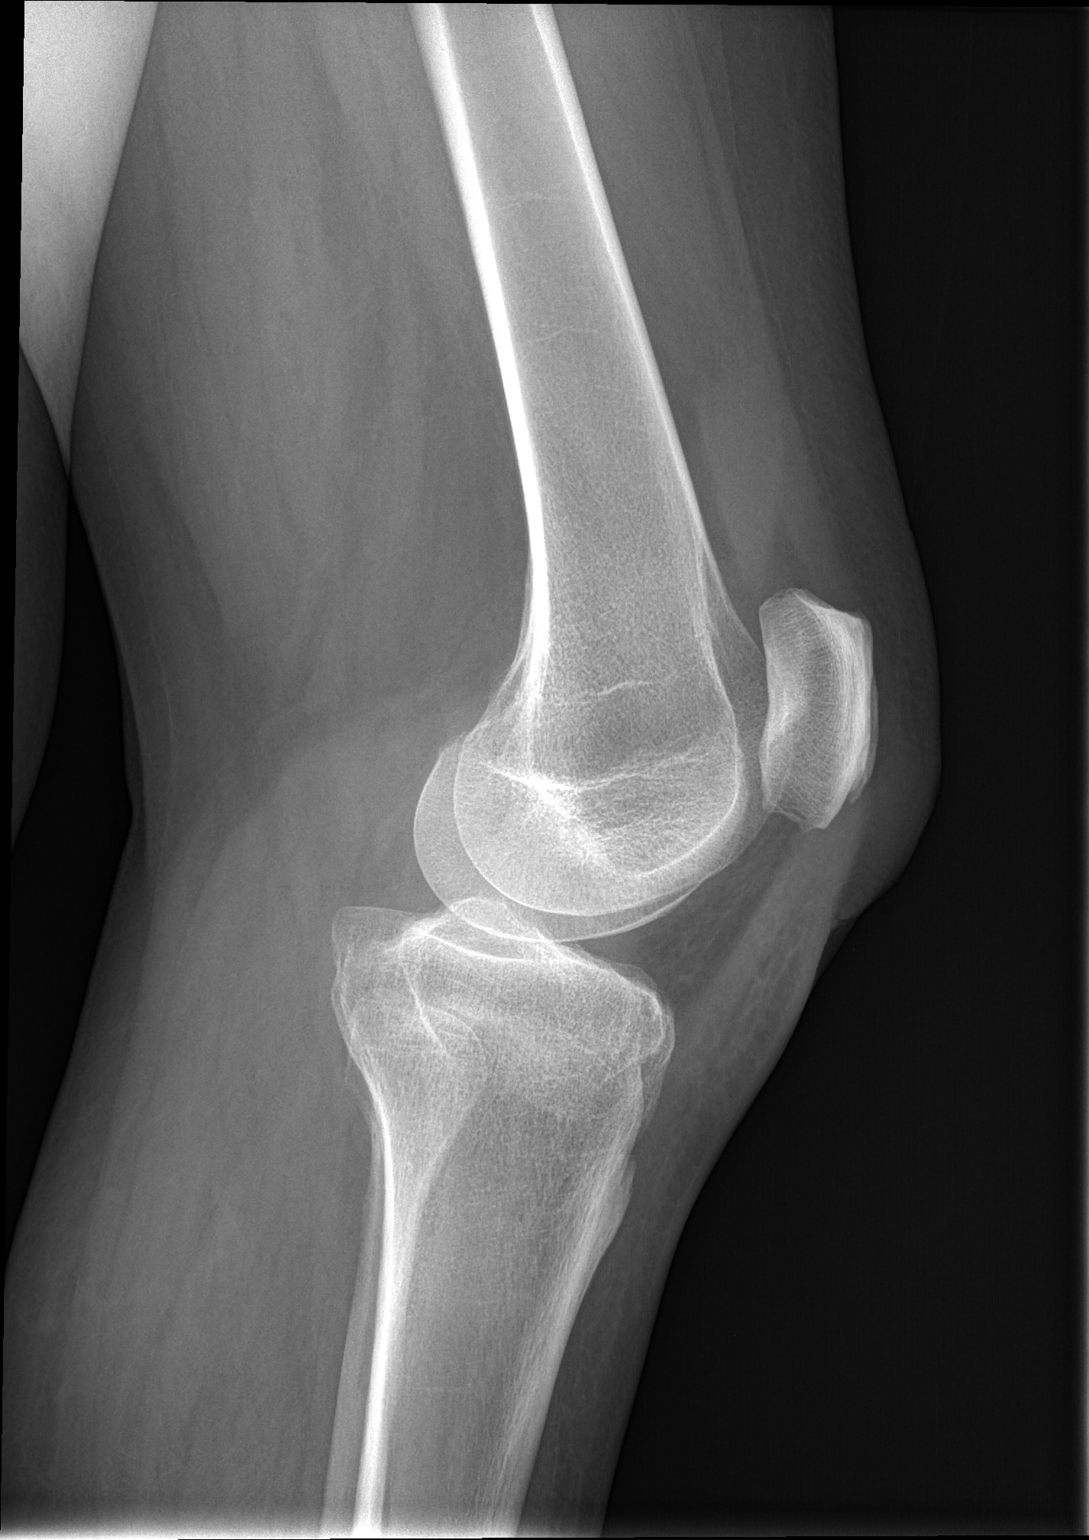

[2 of 2 positions shown; findings below may reference images not displayed]

FINDINGS: Nondisplaced fracture involving the lateral tibial plateau with an
associated joint effusion or hemarthrosis. No fractures elsewhere.
Well preserved joint spaces. Well preserved bone mineral density.
IMPRESSION: Nondisplaced lateral tibial plateau fracture.

## 2016-08-16 DIAGNOSIS — D696 Thrombocytopenia, unspecified: Secondary | ICD-10-CM | POA: Diagnosis not present

## 2016-08-16 DIAGNOSIS — N183 Chronic kidney disease, stage 3 (moderate): Secondary | ICD-10-CM | POA: Diagnosis not present

## 2016-08-16 DIAGNOSIS — I129 Hypertensive chronic kidney disease with stage 1 through stage 4 chronic kidney disease, or unspecified chronic kidney disease: Secondary | ICD-10-CM | POA: Diagnosis not present

## 2016-08-16 DIAGNOSIS — D638 Anemia in other chronic diseases classified elsewhere: Secondary | ICD-10-CM | POA: Diagnosis not present

## 2016-08-16 DIAGNOSIS — Z01812 Encounter for preprocedural laboratory examination: Secondary | ICD-10-CM | POA: Diagnosis not present

## 2016-08-25 DIAGNOSIS — J90 Pleural effusion, not elsewhere classified: Secondary | ICD-10-CM | POA: Diagnosis not present

## 2016-08-25 DIAGNOSIS — Z7982 Long term (current) use of aspirin: Secondary | ICD-10-CM | POA: Diagnosis not present

## 2016-08-25 DIAGNOSIS — Z8249 Family history of ischemic heart disease and other diseases of the circulatory system: Secondary | ICD-10-CM | POA: Diagnosis not present

## 2016-08-25 DIAGNOSIS — K432 Incisional hernia without obstruction or gangrene: Secondary | ICD-10-CM | POA: Diagnosis not present

## 2016-08-25 DIAGNOSIS — I130 Hypertensive heart and chronic kidney disease with heart failure and stage 1 through stage 4 chronic kidney disease, or unspecified chronic kidney disease: Secondary | ICD-10-CM | POA: Diagnosis not present

## 2016-08-25 DIAGNOSIS — R509 Fever, unspecified: Secondary | ICD-10-CM | POA: Diagnosis not present

## 2016-08-25 DIAGNOSIS — I129 Hypertensive chronic kidney disease with stage 1 through stage 4 chronic kidney disease, or unspecified chronic kidney disease: Secondary | ICD-10-CM | POA: Diagnosis not present

## 2016-08-25 DIAGNOSIS — Z8774 Personal history of (corrected) congenital malformations of heart and circulatory system: Secondary | ICD-10-CM | POA: Diagnosis not present

## 2016-08-25 DIAGNOSIS — N183 Chronic kidney disease, stage 3 (moderate): Secondary | ICD-10-CM | POA: Diagnosis not present

## 2016-08-25 DIAGNOSIS — E785 Hyperlipidemia, unspecified: Secondary | ICD-10-CM | POA: Diagnosis not present

## 2016-08-25 DIAGNOSIS — R918 Other nonspecific abnormal finding of lung field: Secondary | ICD-10-CM | POA: Diagnosis not present

## 2016-08-25 DIAGNOSIS — Z881 Allergy status to other antibiotic agents status: Secondary | ICD-10-CM | POA: Diagnosis not present

## 2016-08-25 DIAGNOSIS — R112 Nausea with vomiting, unspecified: Secondary | ICD-10-CM | POA: Diagnosis not present

## 2016-08-25 DIAGNOSIS — Z8776 Personal history of (corrected) congenital malformations of integument, limbs and musculoskeletal system: Secondary | ICD-10-CM | POA: Diagnosis not present

## 2016-08-25 DIAGNOSIS — D696 Thrombocytopenia, unspecified: Secondary | ICD-10-CM | POA: Diagnosis not present

## 2016-08-25 DIAGNOSIS — I509 Heart failure, unspecified: Secondary | ICD-10-CM | POA: Diagnosis not present

## 2016-08-25 DIAGNOSIS — K219 Gastro-esophageal reflux disease without esophagitis: Secondary | ICD-10-CM | POA: Diagnosis not present

## 2016-08-25 DIAGNOSIS — Z880 Allergy status to penicillin: Secondary | ICD-10-CM | POA: Diagnosis not present

## 2016-08-25 DIAGNOSIS — Z886 Allergy status to analgesic agent status: Secondary | ICD-10-CM | POA: Diagnosis not present

## 2016-08-25 DIAGNOSIS — K5669 Other intestinal obstruction: Secondary | ICD-10-CM | POA: Diagnosis not present

## 2016-08-25 DIAGNOSIS — D638 Anemia in other chronic diseases classified elsewhere: Secondary | ICD-10-CM | POA: Diagnosis not present

## 2016-08-25 DIAGNOSIS — Z882 Allergy status to sulfonamides status: Secondary | ICD-10-CM | POA: Diagnosis not present

## 2016-09-07 DIAGNOSIS — K432 Incisional hernia without obstruction or gangrene: Secondary | ICD-10-CM | POA: Diagnosis not present

## 2016-09-14 DIAGNOSIS — Z4889 Encounter for other specified surgical aftercare: Secondary | ICD-10-CM | POA: Diagnosis not present

## 2016-09-21 ENCOUNTER — Telehealth: Payer: Self-pay | Admitting: Nurse Practitioner

## 2016-09-29 DIAGNOSIS — Z880 Allergy status to penicillin: Secondary | ICD-10-CM | POA: Diagnosis not present

## 2016-09-29 DIAGNOSIS — Z888 Allergy status to other drugs, medicaments and biological substances status: Secondary | ICD-10-CM | POA: Diagnosis not present

## 2016-09-29 DIAGNOSIS — Z882 Allergy status to sulfonamides status: Secondary | ICD-10-CM | POA: Diagnosis not present

## 2016-09-29 DIAGNOSIS — I129 Hypertensive chronic kidney disease with stage 1 through stage 4 chronic kidney disease, or unspecified chronic kidney disease: Secondary | ICD-10-CM | POA: Diagnosis not present

## 2016-09-29 DIAGNOSIS — Z8719 Personal history of other diseases of the digestive system: Secondary | ICD-10-CM | POA: Diagnosis not present

## 2016-09-29 DIAGNOSIS — Z87898 Personal history of other specified conditions: Secondary | ICD-10-CM | POA: Diagnosis not present

## 2016-09-29 DIAGNOSIS — Z881 Allergy status to other antibiotic agents status: Secondary | ICD-10-CM | POA: Diagnosis not present

## 2016-09-29 DIAGNOSIS — D631 Anemia in chronic kidney disease: Secondary | ICD-10-CM | POA: Diagnosis not present

## 2016-09-29 DIAGNOSIS — I272 Pulmonary hypertension, unspecified: Secondary | ICD-10-CM | POA: Diagnosis not present

## 2016-09-29 DIAGNOSIS — Z7982 Long term (current) use of aspirin: Secondary | ICD-10-CM | POA: Diagnosis not present

## 2016-09-29 DIAGNOSIS — Z8679 Personal history of other diseases of the circulatory system: Secondary | ICD-10-CM | POA: Diagnosis not present

## 2016-09-29 DIAGNOSIS — Z9889 Other specified postprocedural states: Secondary | ICD-10-CM | POA: Diagnosis not present

## 2016-09-29 DIAGNOSIS — N179 Acute kidney failure, unspecified: Secondary | ICD-10-CM | POA: Diagnosis not present

## 2016-09-29 DIAGNOSIS — E785 Hyperlipidemia, unspecified: Secondary | ICD-10-CM | POA: Diagnosis not present

## 2016-09-29 DIAGNOSIS — Z79899 Other long term (current) drug therapy: Secondary | ICD-10-CM | POA: Diagnosis not present

## 2016-09-29 DIAGNOSIS — N183 Chronic kidney disease, stage 3 (moderate): Secondary | ICD-10-CM | POA: Diagnosis not present

## 2016-10-05 DIAGNOSIS — Z48815 Encounter for surgical aftercare following surgery on the digestive system: Secondary | ICD-10-CM | POA: Diagnosis not present

## 2016-10-12 DIAGNOSIS — N183 Chronic kidney disease, stage 3 (moderate): Secondary | ICD-10-CM | POA: Diagnosis not present

## 2016-10-12 DIAGNOSIS — I130 Hypertensive heart and chronic kidney disease with heart failure and stage 1 through stage 4 chronic kidney disease, or unspecified chronic kidney disease: Secondary | ICD-10-CM | POA: Diagnosis not present

## 2016-10-12 DIAGNOSIS — Z882 Allergy status to sulfonamides status: Secondary | ICD-10-CM | POA: Diagnosis not present

## 2016-10-12 DIAGNOSIS — H6123 Impacted cerumen, bilateral: Secondary | ICD-10-CM | POA: Diagnosis not present

## 2016-10-12 DIAGNOSIS — Q388 Other congenital malformations of pharynx: Secondary | ICD-10-CM | POA: Diagnosis not present

## 2016-10-12 DIAGNOSIS — E785 Hyperlipidemia, unspecified: Secondary | ICD-10-CM | POA: Diagnosis not present

## 2016-10-12 DIAGNOSIS — Z888 Allergy status to other drugs, medicaments and biological substances status: Secondary | ICD-10-CM | POA: Diagnosis not present

## 2016-10-12 DIAGNOSIS — Z881 Allergy status to other antibiotic agents status: Secondary | ICD-10-CM | POA: Diagnosis not present

## 2016-10-12 DIAGNOSIS — R05 Cough: Secondary | ICD-10-CM | POA: Diagnosis not present

## 2016-10-12 DIAGNOSIS — I509 Heart failure, unspecified: Secondary | ICD-10-CM | POA: Diagnosis not present

## 2016-10-12 DIAGNOSIS — Z880 Allergy status to penicillin: Secondary | ICD-10-CM | POA: Diagnosis not present

## 2016-10-12 DIAGNOSIS — H9210 Otorrhea, unspecified ear: Secondary | ICD-10-CM | POA: Diagnosis not present

## 2016-10-12 DIAGNOSIS — Z886 Allergy status to analgesic agent status: Secondary | ICD-10-CM | POA: Diagnosis not present

## 2016-10-12 DIAGNOSIS — I4891 Unspecified atrial fibrillation: Secondary | ICD-10-CM | POA: Diagnosis not present

## 2016-10-12 DIAGNOSIS — K219 Gastro-esophageal reflux disease without esophagitis: Secondary | ICD-10-CM | POA: Diagnosis not present

## 2016-10-12 DIAGNOSIS — H6980 Other specified disorders of Eustachian tube, unspecified ear: Secondary | ICD-10-CM | POA: Diagnosis not present

## 2016-10-12 DIAGNOSIS — H6690 Otitis media, unspecified, unspecified ear: Secondary | ICD-10-CM | POA: Diagnosis not present

## 2016-10-12 DIAGNOSIS — R0981 Nasal congestion: Secondary | ICD-10-CM | POA: Diagnosis not present

## 2016-11-16 ENCOUNTER — Other Ambulatory Visit: Payer: Self-pay | Admitting: Nurse Practitioner

## 2016-11-19 DIAGNOSIS — H40033 Anatomical narrow angle, bilateral: Secondary | ICD-10-CM | POA: Diagnosis not present

## 2016-11-19 DIAGNOSIS — H2513 Age-related nuclear cataract, bilateral: Secondary | ICD-10-CM | POA: Diagnosis not present

## 2016-12-16 ENCOUNTER — Other Ambulatory Visit: Payer: Self-pay | Admitting: Family Medicine

## 2016-12-20 ENCOUNTER — Other Ambulatory Visit: Payer: Self-pay

## 2016-12-20 MED ORDER — VALSARTAN 40 MG PO TABS: 40.0000 mg | ORAL_TABLET | Freq: Every day | ORAL | 0 refills | Status: DC

## 2017-02-03 DIAGNOSIS — R001 Bradycardia, unspecified: Secondary | ICD-10-CM | POA: Diagnosis not present

## 2017-02-03 DIAGNOSIS — R011 Cardiac murmur, unspecified: Secondary | ICD-10-CM | POA: Diagnosis not present

## 2017-02-03 DIAGNOSIS — Z9889 Other specified postprocedural states: Secondary | ICD-10-CM | POA: Diagnosis not present

## 2017-02-03 DIAGNOSIS — I440 Atrioventricular block, first degree: Secondary | ICD-10-CM | POA: Diagnosis not present

## 2017-02-03 DIAGNOSIS — Z8619 Personal history of other infectious and parasitic diseases: Secondary | ICD-10-CM | POA: Diagnosis not present

## 2017-02-03 DIAGNOSIS — Z87898 Personal history of other specified conditions: Secondary | ICD-10-CM | POA: Diagnosis not present

## 2017-02-03 DIAGNOSIS — I4510 Unspecified right bundle-branch block: Secondary | ICD-10-CM | POA: Diagnosis not present

## 2017-02-03 DIAGNOSIS — I493 Ventricular premature depolarization: Secondary | ICD-10-CM | POA: Diagnosis not present

## 2017-02-03 DIAGNOSIS — Q213 Tetralogy of Fallot: Secondary | ICD-10-CM | POA: Diagnosis not present

## 2017-02-03 DIAGNOSIS — N183 Chronic kidney disease, stage 3 (moderate): Secondary | ICD-10-CM | POA: Diagnosis not present

## 2017-02-03 DIAGNOSIS — Q255 Atresia of pulmonary artery: Secondary | ICD-10-CM | POA: Diagnosis not present

## 2017-02-03 DIAGNOSIS — Z8489 Family history of other specified conditions: Secondary | ICD-10-CM | POA: Diagnosis not present

## 2017-02-08 DIAGNOSIS — H47013 Ischemic optic neuropathy, bilateral: Secondary | ICD-10-CM | POA: Diagnosis not present

## 2017-02-09 DIAGNOSIS — Z886 Allergy status to analgesic agent status: Secondary | ICD-10-CM | POA: Diagnosis not present

## 2017-02-09 DIAGNOSIS — Z930 Tracheostomy status: Secondary | ICD-10-CM | POA: Diagnosis not present

## 2017-02-09 DIAGNOSIS — Z7982 Long term (current) use of aspirin: Secondary | ICD-10-CM | POA: Diagnosis not present

## 2017-02-09 DIAGNOSIS — Z880 Allergy status to penicillin: Secondary | ICD-10-CM | POA: Diagnosis not present

## 2017-02-09 DIAGNOSIS — Z888 Allergy status to other drugs, medicaments and biological substances status: Secondary | ICD-10-CM | POA: Diagnosis not present

## 2017-02-09 DIAGNOSIS — Z8679 Personal history of other diseases of the circulatory system: Secondary | ICD-10-CM | POA: Diagnosis not present

## 2017-02-09 DIAGNOSIS — I129 Hypertensive chronic kidney disease with stage 1 through stage 4 chronic kidney disease, or unspecified chronic kidney disease: Secondary | ICD-10-CM | POA: Diagnosis not present

## 2017-02-09 DIAGNOSIS — N183 Chronic kidney disease, stage 3 (moderate): Secondary | ICD-10-CM | POA: Diagnosis not present

## 2017-02-09 DIAGNOSIS — Z9889 Other specified postprocedural states: Secondary | ICD-10-CM | POA: Diagnosis not present

## 2017-02-09 DIAGNOSIS — I130 Hypertensive heart and chronic kidney disease with heart failure and stage 1 through stage 4 chronic kidney disease, or unspecified chronic kidney disease: Secondary | ICD-10-CM | POA: Diagnosis not present

## 2017-02-09 DIAGNOSIS — I272 Pulmonary hypertension, unspecified: Secondary | ICD-10-CM | POA: Diagnosis not present

## 2017-02-09 DIAGNOSIS — N170 Acute kidney failure with tubular necrosis: Secondary | ICD-10-CM | POA: Diagnosis not present

## 2017-02-09 DIAGNOSIS — J9690 Respiratory failure, unspecified, unspecified whether with hypoxia or hypercapnia: Secondary | ICD-10-CM | POA: Diagnosis not present

## 2017-02-09 DIAGNOSIS — I509 Heart failure, unspecified: Secondary | ICD-10-CM | POA: Diagnosis not present

## 2017-02-09 DIAGNOSIS — D631 Anemia in chronic kidney disease: Secondary | ICD-10-CM | POA: Diagnosis not present

## 2017-02-09 DIAGNOSIS — Z79899 Other long term (current) drug therapy: Secondary | ICD-10-CM | POA: Diagnosis not present

## 2017-02-09 DIAGNOSIS — Z8719 Personal history of other diseases of the digestive system: Secondary | ICD-10-CM | POA: Diagnosis not present

## 2017-02-09 DIAGNOSIS — Z881 Allergy status to other antibiotic agents status: Secondary | ICD-10-CM | POA: Diagnosis not present

## 2017-02-09 DIAGNOSIS — E785 Hyperlipidemia, unspecified: Secondary | ICD-10-CM | POA: Diagnosis not present

## 2017-02-09 DIAGNOSIS — Z882 Allergy status to sulfonamides status: Secondary | ICD-10-CM | POA: Diagnosis not present

## 2017-03-03 DIAGNOSIS — N189 Chronic kidney disease, unspecified: Secondary | ICD-10-CM | POA: Diagnosis not present

## 2017-03-23 ENCOUNTER — Other Ambulatory Visit: Payer: Self-pay | Admitting: Family Medicine

## 2017-04-11 DIAGNOSIS — Z882 Allergy status to sulfonamides status: Secondary | ICD-10-CM | POA: Diagnosis not present

## 2017-04-11 DIAGNOSIS — H6123 Impacted cerumen, bilateral: Secondary | ICD-10-CM | POA: Diagnosis not present

## 2017-04-11 DIAGNOSIS — H903 Sensorineural hearing loss, bilateral: Secondary | ICD-10-CM | POA: Diagnosis not present

## 2017-04-11 DIAGNOSIS — Z888 Allergy status to other drugs, medicaments and biological substances status: Secondary | ICD-10-CM | POA: Diagnosis not present

## 2017-04-11 DIAGNOSIS — I509 Heart failure, unspecified: Secondary | ICD-10-CM | POA: Diagnosis not present

## 2017-04-11 DIAGNOSIS — Z881 Allergy status to other antibiotic agents status: Secondary | ICD-10-CM | POA: Diagnosis not present

## 2017-04-11 DIAGNOSIS — Z7982 Long term (current) use of aspirin: Secondary | ICD-10-CM | POA: Diagnosis not present

## 2017-04-11 DIAGNOSIS — Z79899 Other long term (current) drug therapy: Secondary | ICD-10-CM | POA: Diagnosis not present

## 2017-04-11 DIAGNOSIS — N183 Chronic kidney disease, stage 3 (moderate): Secondary | ICD-10-CM | POA: Diagnosis not present

## 2017-04-11 DIAGNOSIS — I130 Hypertensive heart and chronic kidney disease with heart failure and stage 1 through stage 4 chronic kidney disease, or unspecified chronic kidney disease: Secondary | ICD-10-CM | POA: Diagnosis not present

## 2017-04-11 DIAGNOSIS — E785 Hyperlipidemia, unspecified: Secondary | ICD-10-CM | POA: Diagnosis not present

## 2017-04-11 DIAGNOSIS — H6980 Other specified disorders of Eustachian tube, unspecified ear: Secondary | ICD-10-CM | POA: Diagnosis not present

## 2017-04-11 DIAGNOSIS — H6990 Unspecified Eustachian tube disorder, unspecified ear: Secondary | ICD-10-CM | POA: Diagnosis not present

## 2017-04-11 DIAGNOSIS — K1379 Other lesions of oral mucosa: Secondary | ICD-10-CM | POA: Diagnosis not present

## 2017-04-11 DIAGNOSIS — Z886 Allergy status to analgesic agent status: Secondary | ICD-10-CM | POA: Diagnosis not present

## 2017-04-11 DIAGNOSIS — Z880 Allergy status to penicillin: Secondary | ICD-10-CM | POA: Diagnosis not present

## 2017-04-11 DIAGNOSIS — Q388 Other congenital malformations of pharynx: Secondary | ICD-10-CM | POA: Diagnosis not present

## 2017-04-11 DIAGNOSIS — I4891 Unspecified atrial fibrillation: Secondary | ICD-10-CM | POA: Diagnosis not present

## 2017-04-24 ENCOUNTER — Other Ambulatory Visit: Payer: Self-pay | Admitting: Family Medicine

## 2017-04-24 NOTE — Telephone Encounter (Signed)
Defer to [place], MD [facility] 04/24/2017, 8:48 AM

## 2017-04-25 NOTE — Telephone Encounter (Signed)
Refill denied- I have olny seen for sinus infection over a year ago

## 2017-04-25 NOTE — Telephone Encounter (Signed)
lmtcb to schedule an appt for medication refill

## 2017-05-02 ENCOUNTER — Other Ambulatory Visit: Payer: Self-pay | Admitting: Family Medicine

## 2017-05-02 ENCOUNTER — Ambulatory Visit (INDEPENDENT_AMBULATORY_CARE_PROVIDER_SITE_OTHER): Payer: Medicare Other | Admitting: Nurse Practitioner

## 2017-05-02 ENCOUNTER — Encounter: Payer: Self-pay | Admitting: Nurse Practitioner

## 2017-05-02 VITALS — BP 125/78 | HR 91 | Temp 98.0°F | Ht 64.0 in | Wt 174.0 lb

## 2017-05-02 DIAGNOSIS — I10 Essential (primary) hypertension: Secondary | ICD-10-CM | POA: Diagnosis not present

## 2017-05-02 DIAGNOSIS — E785 Hyperlipidemia, unspecified: Secondary | ICD-10-CM | POA: Diagnosis not present

## 2017-05-02 DIAGNOSIS — E559 Vitamin D deficiency, unspecified: Secondary | ICD-10-CM

## 2017-05-02 DIAGNOSIS — N189 Chronic kidney disease, unspecified: Secondary | ICD-10-CM | POA: Diagnosis not present

## 2017-05-02 MED ORDER — LOVASTATIN 20 MG PO TABS: 20.0000 mg | ORAL_TABLET | Freq: Every day | ORAL | 5 refills | Status: DC

## 2017-05-02 MED ORDER — VALSARTAN 40 MG PO TABS: 40.0000 mg | ORAL_TABLET | Freq: Every day | ORAL | 0 refills | Status: DC

## 2017-05-02 NOTE — Patient Instructions (Signed)
DASH Eating Plan DASH stands for "Dietary Approaches to Stop Hypertension." The DASH eating plan is a healthy eating plan that has been shown to reduce high blood pressure (hypertension). It may also reduce your risk for type 2 diabetes, heart disease, and stroke. The DASH eating plan may also help with weight loss. What are tips for following this plan? General guidelines  Avoid eating more than 2,300 mg (milligrams) of salt (sodium) a day. If you have hypertension, you may need to reduce your sodium intake to 1,500 mg a day.  Limit alcohol intake to no more than 1 drink a day for nonpregnant women and 2 drinks a day for men. One drink equals 12 oz of beer, 5 oz of wine, or 1 oz of hard liquor.  Work with your health care provider to maintain a healthy body weight or to lose weight. Ask what an ideal weight is for you.  Get at least 30 minutes of exercise that causes your heart to beat faster (aerobic exercise) most days of the week. Activities may include walking, swimming, or biking.  Work with your health care provider or diet and nutrition specialist (dietitian) to adjust your eating plan to your individual calorie needs. Reading food labels  Check food labels for the amount of sodium per serving. Choose foods with less than 5 percent of the Daily Value of sodium. Generally, foods with less than 300 mg of sodium per serving fit into this eating plan.  To find whole grains, look for the word "whole" as the first word in the ingredient list. Shopping  Buy products labeled as "low-sodium" or "no salt added."  Buy fresh foods. Avoid canned foods and premade or frozen meals. Cooking  Avoid adding salt when cooking. Use salt-free seasonings or herbs instead of table salt or sea salt. Check with your health care provider or pharmacist before using salt substitutes.  Do not fry foods. Cook foods using healthy methods such as baking, boiling, grilling, and broiling instead.  Cook with  heart-healthy oils, such as olive, canola, soybean, or sunflower oil. Meal planning   Eat a balanced diet that includes: ? 5 or more servings of fruits and vegetables each day. At each meal, try to fill half of your plate with fruits and vegetables. ? Up to 6-8 servings of whole grains each day. ? Less than 6 oz of lean meat, poultry, or fish each day. A 3-oz serving of meat is about the same size as a deck of cards. One egg equals 1 oz. ? 2 servings of low-fat dairy each day. ? A serving of nuts, seeds, or beans 5 times each week. ? Heart-healthy fats. Healthy fats called Omega-3 fatty acids are found in foods such as flaxseeds and coldwater fish, like sardines, salmon, and mackerel.  Limit how much you eat of the following: ? Canned or prepackaged foods. ? Food that is high in trans fat, such as fried foods. ? Food that is high in saturated fat, such as fatty meat. ? Sweets, desserts, sugary drinks, and other foods with added sugar. ? Full-fat dairy products.  Do not salt foods before eating.  Try to eat at least 2 vegetarian meals each week.  Eat more home-cooked food and less restaurant, buffet, and fast food.  When eating at a restaurant, ask that your food be prepared with less salt or no salt, if possible. What foods are recommended? The items listed may not be a complete list. Talk with your dietitian about what   dietary choices are best for you. Grains Whole-grain or whole-wheat bread. Whole-grain or whole-wheat pasta. Brown rice. Oatmeal. Quinoa. Bulgur. Whole-grain and low-sodium cereals. Pita bread. Low-fat, low-sodium crackers. Whole-wheat flour tortillas. Vegetables Fresh or frozen vegetables (raw, steamed, roasted, or grilled). Low-sodium or reduced-sodium tomato and vegetable juice. Low-sodium or reduced-sodium tomato sauce and tomato paste. Low-sodium or reduced-sodium canned vegetables. Fruits All fresh, dried, or frozen fruit. Canned fruit in natural juice (without  added sugar). Meat and other protein foods Skinless chicken or turkey. Ground chicken or turkey. Pork with fat trimmed off. Fish and seafood. Egg whites. Dried beans, peas, or lentils. Unsalted nuts, nut butters, and seeds. Unsalted canned beans. Lean cuts of beef with fat trimmed off. Low-sodium, lean deli meat. Dairy Low-fat (1%) or fat-free (skim) milk. Fat-free, low-fat, or reduced-fat cheeses. Nonfat, low-sodium ricotta or cottage cheese. Low-fat or nonfat yogurt. Low-fat, low-sodium cheese. Fats and oils Soft margarine without trans fats. Vegetable oil. Low-fat, reduced-fat, or light mayonnaise and salad dressings (reduced-sodium). Canola, safflower, olive, soybean, and sunflower oils. Avocado. Seasoning and other foods Herbs. Spices. Seasoning mixes without salt. Unsalted popcorn and pretzels. Fat-free sweets. What foods are not recommended? The items listed may not be a complete list. Talk with your dietitian about what dietary choices are best for you. Grains Baked goods made with fat, such as croissants, muffins, or some breads. Dry pasta or rice meal packs. Vegetables Creamed or fried vegetables. Vegetables in a cheese sauce. Regular canned vegetables (not low-sodium or reduced-sodium). Regular canned tomato sauce and paste (not low-sodium or reduced-sodium). Regular tomato and vegetable juice (not low-sodium or reduced-sodium). Pickles. Olives. Fruits Canned fruit in a light or heavy syrup. Fried fruit. Fruit in cream or butter sauce. Meat and other protein foods Fatty cuts of meat. Ribs. Fried meat. Bacon. Sausage. Bologna and other processed lunch meats. Salami. Fatback. Hotdogs. Bratwurst. Salted nuts and seeds. Canned beans with added salt. Canned or smoked fish. Whole eggs or egg yolks. Chicken or turkey with skin. Dairy Whole or 2% milk, cream, and half-and-half. Whole or full-fat cream cheese. Whole-fat or sweetened yogurt. Full-fat cheese. Nondairy creamers. Whipped toppings.  Processed cheese and cheese spreads. Fats and oils Butter. Stick margarine. Lard. Shortening. Ghee. Bacon fat. Tropical oils, such as coconut, palm kernel, or palm oil. Seasoning and other foods Salted popcorn and pretzels. Onion salt, garlic salt, seasoned salt, table salt, and sea salt. Worcestershire sauce. Tartar sauce. Barbecue sauce. Teriyaki sauce. Soy sauce, including reduced-sodium. Steak sauce. Canned and packaged gravies. Fish sauce. Oyster sauce. Cocktail sauce. Horseradish that you find on the shelf. Ketchup. Mustard. Meat flavorings and tenderizers. Bouillon cubes. Hot sauce and Tabasco sauce. Premade or packaged marinades. Premade or packaged taco seasonings. Relishes. Regular salad dressings. Where to find more information:  National Heart, Lung, and Blood Institute: www.nhlbi.nih.gov  American Heart Association: www.heart.org Summary  The DASH eating plan is a healthy eating plan that has been shown to reduce high blood pressure (hypertension). It may also reduce your risk for type 2 diabetes, heart disease, and stroke.  With the DASH eating plan, you should limit salt (sodium) intake to 2,300 mg a day. If you have hypertension, you may need to reduce your sodium intake to 1,500 mg a day.  When on the DASH eating plan, aim to eat more fresh fruits and vegetables, whole grains, lean proteins, low-fat dairy, and heart-healthy fats.  Work with your health care provider or diet and nutrition specialist (dietitian) to adjust your eating plan to your individual   calorie needs. This information is not intended to replace advice given to you by your health care provider. Make sure you discuss any questions you have with your health care provider. Document Released: 11/10/2011 Document Revised: 11/14/2016 Document Reviewed: 11/14/2016 Elsevier Interactive Patient Education  2017 Elsevier Inc.  

## 2017-05-02 NOTE — Progress Notes (Signed)
Subjective:    Patient ID: Bradley Graham, male    DOB: [birth year], 46 y.o.   MRN: [id number]  HPI Bradley Graham is here today for follow up of chronic medical problem.  Outpatient Encounter Prescriptions as of 05/02/2017  Medication Sig  . aspirin 81 MG tablet Take 81 mg by mouth daily.  Marland Kitchen docusate sodium (COLACE) 100 MG capsule Take 100 mg by mouth daily.  . fluticasone (FLONASE) 50 MCG/ACT nasal spray Place 1 spray into both nostrils 2 (two) times daily.  Marland Kitchen loratadine (CLARITIN) 10 MG tablet Take 10 mg by mouth daily.  Marland Kitchen lovastatin (MEVACOR) 20 MG tablet Take 20 mg by mouth at bedtime.   . valsartan (DIOVAN) 40 MG tablet TAKE 1 TABLET (40 MG TOTAL) BY MOUTH DAILY.   No facility-administered encounter medications on file as of 05/02/2017.     1. Chronic kidney disease, unspecified CKD stage  Managed by specialist.  No complaints at this time.  2. Vitamin D deficiency   3. Essential hypertension, benign  Patient takes valsartan for management.   4. Hyperlipidemia, unspecified hyperlipidemia type  Patient takes lovastatin for management.  Patient is watching his diet.    New complaints: Patient states he is having some pain in the left knee with movement, but believes it be arthritis and states it is bearable.    Review of Systems  Constitutional: Negative for activity change, appetite change and fatigue.  Respiratory: Negative for cough, shortness of breath and wheezing.   Cardiovascular: Negative for chest pain and palpitations.  Gastrointestinal: Negative for abdominal distention and abdominal pain.  Musculoskeletal: Positive for arthralgias (left knee).  Neurological: Negative for dizziness and headaches.  All other systems reviewed and are negative.      Objective:   Physical Exam  Constitutional: He is oriented to person, place, and time. He appears well-developed and well-nourished. No distress.  HENT:  Head: Normocephalic.  Right Ear: External ear normal.    Left Ear: External ear normal.  Mouth/Throat: Oropharynx is clear and moist.  Eyes: Pupils are equal, round, and reactive to light.  Neck: Normal range of motion. Neck supple. No JVD present. No thyromegaly present.  Cardiovascular: Normal rate, regular rhythm, normal heart sounds and intact distal pulses.   No murmur heard. Pulmonary/Chest: Effort normal and breath sounds normal. No respiratory distress.  Abdominal: Soft. Bowel sounds are normal. He exhibits no distension. There is no tenderness.  Musculoskeletal: Normal range of motion.  Lymphadenopathy:    He has no cervical adenopathy.  Neurological: He is alert and oriented to person, place, and time.  Skin: Skin is warm and dry. No rash noted.  Psychiatric: He has a normal mood and affect. His behavior is normal. Judgment and thought content normal.   BP 125/78   Pulse 91   Temp 98 F (36.7 C) (Oral)   Ht '5\' 4"'$  (1.626 m)   Wt 174 lb (78.9 kg)   BMI 29.87 kg/m       Assessment & Plan:  1. Chronic kidney disease, unspecified CKD stage Will continue to watch labs  2. Vitamin D deficiency  3. Essential hypertension, benign Low sodium idet - CMP14+EGFR - valsartan (DIOVAN) 40 MG tablet; Take 1 tablet (40 mg total) by mouth daily.  Dispense: 30 tablet; Refill: 0  4. Hyperlipidemia, unspecified hyperlipidemia type Low fat diet - Lipid panel - lovastatin (MEVACOR) 20 MG tablet; Take 1 tablet (20 mg total) by mouth at bedtime.  Dispense: 30 tablet; Refill: 5  Labs pending Health maintenance reviewed Diet and exercise encouraged Continue all meds Follow up  In 6 months   [place], FNP

## 2017-05-03 LAB — CMP14+EGFR
A/G RATIO: 1.9 (ref 1.2–2.2)
ALT: 12 IU/L (ref 0–44)
AST: 20 IU/L (ref 0–40)
Albumin: 4.9 g/dL (ref 3.5–5.5)
Alkaline Phosphatase: 87 IU/L (ref 39–117)
BUN / CREAT RATIO: 16 (ref 9–20)
BUN: 27 mg/dL — ABNORMAL HIGH (ref 6–24)
Bilirubin Total: 0.6 mg/dL (ref 0.0–1.2)
CALCIUM: 9.2 mg/dL (ref 8.7–10.2)
CHLORIDE: 100 mmol/L (ref 96–106)
CO2: 24 mmol/L (ref 18–29)
Creatinine, Ser: 1.73 mg/dL — ABNORMAL HIGH (ref 0.76–1.27)
GFR, EST AFRICAN AMERICAN: 54 mL/min/{1.73_m2} — AB (ref >59)
GFR, EST NON AFRICAN AMERICAN: 46 mL/min/{1.73_m2} — AB (ref >59)
GLOBULIN, TOTAL: 2.6 g/dL (ref 1.5–4.5)
Glucose: 115 mg/dL — ABNORMAL HIGH (ref 65–99)
POTASSIUM: 5 mmol/L (ref 3.5–5.2)
SODIUM: 141 mmol/L (ref 134–144)
TOTAL PROTEIN: 7.5 g/dL (ref 6.0–8.5)

## 2017-05-03 LAB — LIPID PANEL
CHOL/HDL RATIO: 2.8 ratio (ref 0.0–5.0)
Cholesterol, Total: 185 mg/dL (ref 100–199)
HDL: 66 mg/dL (ref >39)
LDL Calculated: 96 mg/dL (ref 0–99)
Triglycerides: 115 mg/dL (ref 0–149)
VLDL Cholesterol Cal: 23 mg/dL (ref 5–40)

## 2017-05-31 ENCOUNTER — Other Ambulatory Visit: Payer: Self-pay | Admitting: Nurse Practitioner

## 2017-05-31 DIAGNOSIS — I10 Essential (primary) hypertension: Secondary | ICD-10-CM

## 2017-06-01 MED ORDER — VALSARTAN 40 MG PO TABS: 40.0000 mg | ORAL_TABLET | Freq: Every day | ORAL | 0 refills | Status: DC

## 2017-06-01 NOTE — Addendum Note (Signed)
Addended by: Antonietta Barcelona D on: 06/01/2017 08:57 AM   Modules accepted: Orders

## 2017-07-11 ENCOUNTER — Ambulatory Visit (INDEPENDENT_AMBULATORY_CARE_PROVIDER_SITE_OTHER): Payer: Medicare Other | Admitting: Sports Medicine

## 2017-07-11 VITALS — BP 150/90 | Ht 63.0 in | Wt 175.0 lb

## 2017-07-11 DIAGNOSIS — M25579 Pain in unspecified ankle and joints of unspecified foot: Secondary | ICD-10-CM

## 2017-07-11 NOTE — Progress Notes (Signed)
   Subjective:    Patient ID: Bradley Graham, male    DOB: [birth year], 45 y.o.   MRN: [id number]  HPI   He was last seen in the office back in April 2016. He has very complicated foot and ankle anatomy due to a clubfoot deformity with equinus contracture, cavovarus, and abducted forefoot which required extensive ankle reconstruction in 2013. The  orthotics that we made in 2016 were very comfortable. However, he has since lost them. He is currently using a pair of off-the-shelf inserts which are somewhat helpful but he would like new custom orthotics. He is here today with his mom  Past medical history is reviewed. Medical history is very extensive Surgical history also reviewed which is also extensive Allergies reviewed Medications reviewed    Review of Systems As above    Objective:   Physical Exam  Well-developed, well-nourished. No acute distress  Examination of both feet show a well-healed surgical incision along the left medial foot. He has a pes cavus of the right foot with partial collapse of the longitudinal arch with standing.  Loss of posterior tibialis tendon on the left with inward collapse of the foot on standing and insufficiency of the posterior tibialis tendon. He has decreased range of motion at the ankle. Strength is somewhat limited by decreased range of motion. Good pulses. He has out toeing of the left foot with walking.      Assessment & Plan:   Posterior tibialis tendon insufficiency, left foot Status post ankle reconstruction in 2013  A new pair of custom orthotics were created today. Instead of using the black Fasttech material, I decided to use the regular red insert. I added a first ray post to the left insert and the patient found the orthotics to be comfortable prior to leaving the office. A total of 30 minutes was spent with the patient with greater than 50% time spent in face-to-face consultation discussing his chronic foot condition, orthotic  construction, orthotic instruction, and fitting. If he finds these orthotics to be uncomfortable that we could consider returning to the black Fastech base. Follow-up as needed.  Patient was fitted for a : standard, cushioned, semi-rigid orthotic. The orthotic was heated and afterward the patient stood on the orthotic blank positioned on the orthotic stand. The patient was positioned in subtalar neutral position and 10 degrees of ankle dorsiflexion in a weight bearing stance. After completion of molding, a stable base was applied to the orthotic blank. The blank was ground to a stable position for weight bearing. Size: 8 Base: Blue EVA Posting: First ray post on the left Additional orthotic padding: none

## 2017-07-19 ENCOUNTER — Ambulatory Visit (INDEPENDENT_AMBULATORY_CARE_PROVIDER_SITE_OTHER): Payer: Medicare Other | Admitting: *Deleted

## 2017-07-19 VITALS — BP 130/78 | HR 85 | Ht 63.0 in | Wt 176.0 lb

## 2017-07-19 DIAGNOSIS — Z0000 Encounter for general adult medical examination without abnormal findings: Secondary | ICD-10-CM

## 2017-07-19 NOTE — Patient Instructions (Signed)
  Bradley Graham ,  Thank you for taking time to come for your Medicare Wellness Visit. I appreciate your ongoing commitment to your health goals. Please review the following plan we discussed and let me know if I can assist you in the future.   These are the goals we discussed: Goals    . Exercise 150 minutes per week (moderate activity)          Continue to exercise for at least 30 minutes daily      Be careful not to lift anything too heavy.  This is a list of the screening recommended for you and due dates:  Health Maintenance  Topic Date Due  . Flu Shot  07/05/2017  . Tetanus Vaccine  11/02/2017*  . HIV Screening  05/02/2018*  *Topic was postponed. The date shown is not the original due date.   Keep all of your follow up appointments with your specialist.

## 2017-07-19 NOTE — Progress Notes (Signed)
Subjective:   Bradley Graham is a 46 y.o. male who presents for a subsequent Medicare Annual Wellness Visit. Bradley Graham is accompanied today by his mother who helped to provide some of his health history. He lives at home with his parents. He has 2 sisters and 3 living brothers and 1 that is deceased. Bradley Graham has spastic cerebral palsy, chronic kidney disease, and congenital heart defects. He had a large abdominal hernia repaired last year and heart surgery. He is able to drive and run some of his own errands. He enjoys playing the Mandolin and used to communicate with a Ham Radio a lot. He learned Leamon Ar[employer] Code and was certified in that. He is involved with church and has many friends there.   Review of Systems  Reports that health is better than last year.   Cardiac Risk Factors include: male gender;Other (see comment), Risk factor comments: Congenital heart defect  Musculoskeletal: Back and foot pain due to congenital deformities. Club feet were repaired as a child but has some pain in standing for prolonged periods.   Other systems negative today. Objective:    Today's Vitals   07/19/17 0944  BP: 130/78  Pulse: 85  Weight: 176 lb (79.8 kg)  Height: 5\' 3"  (1.6 m)   Body mass index is 31.18 kg/m.  Current Medications (verified) Outpatient Encounter Prescriptions as of 07/19/2017  Medication Sig  . aspirin 81 MG tablet Take 81 mg by mouth daily.  Marland Kitchen docusate sodium (COLACE) 100 MG capsule Take 100 mg by mouth daily.  . fluticasone (FLONASE) 50 MCG/ACT nasal spray Place 1 spray into both nostrils 2 (two) times daily.  Marland Kitchen loratadine (CLARITIN) 10 MG tablet Take 10 mg by mouth daily.  Marland Kitchen lovastatin (MEVACOR) 20 MG tablet Take 1 tablet (20 mg total) by mouth at bedtime.  . ranitidine (ZANTAC 75) 75 MG tablet Take 75 mg by mouth 2 (two) times daily.  . valsartan (DIOVAN) 40 MG tablet Take 1 tablet (40 mg total) by mouth daily.   No facility-administered encounter medications on file as of  07/19/2017.     Allergies (verified) Aleve [naproxen sodium]; Bactrim [sulfamethoxazole-trimethoprim]; Erythromycin; Heparin; Nsaids; Penicillins; and Cortisporin [bacitra-neomycin-polymyxin-hc]   History: Past Medical History:  Diagnosis Date  . Atrial fibrillation ([place])   . CHF (congestive heart failure) ([place])   . CKD (chronic kidney disease)   . Club foot of both feet   . Hypertension   . Left ventricular hypertrophy   . Right ventricular hypertrophy   . Speech impediment   . Tetralogy of Fallot    s/p waterson shunt  . Vitamin D deficiency    Past Surgical History:  Procedure Laterality Date  . ABDOMINAL HERNIA REPAIR     [facility]   . ADENOIDECTOMY    . MOUTH SURGERY    . scoliosis    . VSD REPAIR    . WRIST SURGERY Left    Family History  Problem Relation Age of Onset  . Hyperlipidemia Mother   . Hyperthyroidism Mother   . Diabetes insipidus Mother   . Diabetes Father   . Hyperlipidemia Father   . Hypertension Father   . Hyperthyroidism Sister   . Cancer Maternal Grandfather 70       breast cancer  . Pernicious anemia Maternal Grandfather   . Cancer Cousin        colon cance   Social History   Occupational History  . Not on file.   Social History Main Topics  .  Smoking status: Never Smoker  . Smokeless tobacco: Never Used  . Alcohol use No  . Drug use: No  . Sexual activity: No   Tobacco Counseling No tobacco use  Activities of Daily Living In your present state of health, do you have any difficulty performing the following activities: 07/19/2017  Hearing? Y  Comment Has some hearing deficits with very high frequency or very low frequency  Vision? N  Difficulty concentrating or making decisions? N  Walking or climbing stairs? N  Dressing or bathing? N  Doing errands, shopping? Y  Comment Has help with errands. Patient drives and makes his bed.   Preparing Food and eating ? N  Using the Toilet? N  In the past six months, have you accidently  leaked urine? N  Do you have problems with loss of bowel control? N  Managing your Medications? N  Managing your Finances? Y  Housekeeping or managing your Housekeeping? N  Some recent data might be hidden    Immunizations and Health Maintenance Immunization History  Administered Date(s) Administered  . Influenza-Unspecified 10/15/2015   Health Maintenance Due  Topic Date Due  . INFLUENZA VACCINE  07/05/2017   Patient Care Team: Chevis Pretty, FNP as PCP - General (Nurse Practitioner) Wilfrid Lund, MD (Otolaryngology) Ophelia Shoulder, MD (Pulmonary Disease) Plonk, Vivien Presto, MD as Referring Physician (Otolaryngology) Mercie Eon, MD (Internal Medicine) Thurman Coyer, DO as Consulting Physician (Sports Medicine) Joseph Berkshire, Micheal Likens, MD as Referring Physician (Nephrology) Anthony Sar, OD as optometrist   No hospitalizations, ER visits or surgeries this year.     Assessment:   This is a routine wellness examination for Bradley Graham.   Hearing/Vision screen No deficits noted. Mother reported that he does have high and low frequency hearing loss. Runnells with conversations. Last eye exam was in 2017.   Dietary issues and exercise activities discussed: Current Exercise Habits: Structured exercise class, Type of exercise: treadmill;walking;strength training/weights, Time (Minutes): 45, Frequency (Times/Week): 7, Weekly Exercise (Minutes/Week): 315, Intensity: Moderate, Exercise limited by: cardiac condition(s);orthopedic condition(s)   Diet: Eats 2 to 3 meals a day and snacks. Diet limited due to kidney disease.   Goals    . Exercise 150 minutes per week (moderate activity)          Continue to exercise for at least 30 minutes daily      Depression Screen PHQ 2/9 Scores 05/02/2017 05/13/2016 10/20/2015 05/06/2015  PHQ - 2 Score 0 0 0 0    Fall Risk Fall Risk  05/02/2017 05/13/2016 10/20/2015 05/06/2015 02/23/2015  Falls in the past year? No Yes No No No  Number falls in  past yr: - 2 or more - - -  Injury with Fall? - Yes - - -  Comment - hurt left knee - - -    Cognitive Function: MMSE - Mini Mental State Exam 07/19/2017 05/06/2015  Not completed: - Unable to complete  Orientation to time 5 -  Orientation to Place 5 -  Registration 3 -  Attention/ Calculation 4 -  Recall 3 -  Language- name 2 objects 2 -  Language- repeat 1 -  Language- follow 3 step command 3 -  Language- read & follow direction 1 -  Write a sentence 1 -  Copy design 0 -  Total score 28 -    Normal exam    Screening Tests Health Maintenance  Topic Date Due  . INFLUENZA VACCINE  07/05/2017  . TETANUS/TDAP  11/02/2017 (Originally 04/13/1990)  . HIV  Screening  05/02/2018 (Originally 04/13/1986)        Plan:  Continue to exercise regularly. Aim for at least 30 minutes daily. Be careful with we ights and do not lift over the amount your surgeon recommended. Keep appts with specialists. Flu shot in October.  Consider Tdap. Try to eat a healthy diet. Limit sugars.  I have personally reviewed and noted the following in the patient's chart:   . Medical and social history . Use of alcohol, tobacco or illicit drugs  . Current medications and supplements . Functional ability and status . Nutritional status . Physical activity . Advanced directives . List of other physicians . Hospitalizations, surgeries, and ER visits in previous 12 months . Vitals . Screenings to include cognitive, depression, and falls . Referrals and appointments  In addition, I have reviewed and discussed with patient certain preventive protocols, quality metrics, and best practice recommendations. A written personalized care plan for preventive services as well as general preventive health recommendations were provided to patient.    Chong Sicilian, RN 07/19/2017 I have reviewed and agree with the above AWV documentation.   Mary-Margaret Hassell Done, FNP

## 2017-07-31 ENCOUNTER — Telehealth: Payer: Self-pay | Admitting: Nurse Practitioner

## 2017-07-31 MED ORDER — OLMESARTAN MEDOXOMIL 40 MG PO TABS: 40.0000 mg | ORAL_TABLET | Freq: Every day | ORAL | 5 refills | Status: DC

## 2017-07-31 NOTE — Telephone Encounter (Signed)
Patients mother aware  

## 2017-07-31 NOTE — Telephone Encounter (Signed)
valsartan changed to benicar

## 2017-08-10 DIAGNOSIS — Z8774 Personal history of (corrected) congenital malformations of heart and circulatory system: Secondary | ICD-10-CM | POA: Diagnosis not present

## 2017-08-10 DIAGNOSIS — Z886 Allergy status to analgesic agent status: Secondary | ICD-10-CM | POA: Diagnosis not present

## 2017-08-10 DIAGNOSIS — I272 Pulmonary hypertension, unspecified: Secondary | ICD-10-CM | POA: Diagnosis not present

## 2017-08-10 DIAGNOSIS — E785 Hyperlipidemia, unspecified: Secondary | ICD-10-CM | POA: Diagnosis not present

## 2017-08-10 DIAGNOSIS — I130 Hypertensive heart and chronic kidney disease with heart failure and stage 1 through stage 4 chronic kidney disease, or unspecified chronic kidney disease: Secondary | ICD-10-CM | POA: Diagnosis not present

## 2017-08-10 DIAGNOSIS — Z881 Allergy status to other antibiotic agents status: Secondary | ICD-10-CM | POA: Diagnosis not present

## 2017-08-10 DIAGNOSIS — Z9889 Other specified postprocedural states: Secondary | ICD-10-CM | POA: Diagnosis not present

## 2017-08-10 DIAGNOSIS — Z7982 Long term (current) use of aspirin: Secondary | ICD-10-CM | POA: Diagnosis not present

## 2017-08-10 DIAGNOSIS — Z930 Tracheostomy status: Secondary | ICD-10-CM | POA: Diagnosis not present

## 2017-08-10 DIAGNOSIS — Z79899 Other long term (current) drug therapy: Secondary | ICD-10-CM | POA: Diagnosis not present

## 2017-08-10 DIAGNOSIS — I509 Heart failure, unspecified: Secondary | ICD-10-CM | POA: Diagnosis not present

## 2017-08-10 DIAGNOSIS — I129 Hypertensive chronic kidney disease with stage 1 through stage 4 chronic kidney disease, or unspecified chronic kidney disease: Secondary | ICD-10-CM | POA: Diagnosis not present

## 2017-08-10 DIAGNOSIS — Z9049 Acquired absence of other specified parts of digestive tract: Secondary | ICD-10-CM | POA: Diagnosis not present

## 2017-08-10 DIAGNOSIS — J9690 Respiratory failure, unspecified, unspecified whether with hypoxia or hypercapnia: Secondary | ICD-10-CM | POA: Diagnosis not present

## 2017-08-10 DIAGNOSIS — Z888 Allergy status to other drugs, medicaments and biological substances status: Secondary | ICD-10-CM | POA: Diagnosis not present

## 2017-08-10 DIAGNOSIS — Z880 Allergy status to penicillin: Secondary | ICD-10-CM | POA: Diagnosis not present

## 2017-08-10 DIAGNOSIS — Z8673 Personal history of transient ischemic attack (TIA), and cerebral infarction without residual deficits: Secondary | ICD-10-CM | POA: Diagnosis not present

## 2017-08-10 DIAGNOSIS — N183 Chronic kidney disease, stage 3 (moderate): Secondary | ICD-10-CM | POA: Diagnosis not present

## 2017-08-10 DIAGNOSIS — Z23 Encounter for immunization: Secondary | ICD-10-CM | POA: Diagnosis not present

## 2017-08-10 DIAGNOSIS — Z882 Allergy status to sulfonamides status: Secondary | ICD-10-CM | POA: Diagnosis not present

## 2017-08-10 DIAGNOSIS — N179 Acute kidney failure, unspecified: Secondary | ICD-10-CM | POA: Diagnosis not present

## 2017-08-10 DIAGNOSIS — D631 Anemia in chronic kidney disease: Secondary | ICD-10-CM | POA: Diagnosis not present

## 2017-09-05 DIAGNOSIS — N189 Chronic kidney disease, unspecified: Secondary | ICD-10-CM | POA: Diagnosis not present

## 2017-10-06 ENCOUNTER — Ambulatory Visit (INDEPENDENT_AMBULATORY_CARE_PROVIDER_SITE_OTHER): Payer: Medicare Other | Admitting: Pediatrics

## 2017-10-06 ENCOUNTER — Encounter: Payer: Self-pay | Admitting: Pediatrics

## 2017-10-06 VITALS — BP 131/80 | HR 91 | Temp 98.0°F | Ht 63.0 in | Wt 173.2 lb

## 2017-10-06 DIAGNOSIS — J189 Pneumonia, unspecified organism: Secondary | ICD-10-CM

## 2017-10-06 MED ORDER — AZITHROMYCIN 250 MG PO TABS: ORAL_TABLET | ORAL | 0 refills | Status: DC

## 2017-10-06 NOTE — Progress Notes (Signed)
  Subjective:   Patient ID: Bradley Graham, male    DOB: Jul 10, 1971, 46 y.o.   MRN: [id number] CC: Headache; Cough; and Nasal Congestion  HPI: Bradley Graham is a 46 y.o. male presenting for Headache; Cough; and Nasal Congestion  Sick for the past week Past two days feeling worse each day Coughing more Now productive Lots of sinus congestion, some at baseline Subjective temperatures at home No sore throat No SOB H/o congenital heart disease s/p repair, with mixing lesion  Here today with mother Appetite has been ok  Relevant past medical, surgical, family and social history reviewed. Allergies and medications reviewed and updated. Social History   Tobacco Use  Smoking Status Never Smoker  Smokeless Tobacco Never Used   ROS: Per HPI   Objective:    BP 131/80   Pulse 91   Temp 98 F (36.7 C) (Oral)   Ht 5\' 3"  (1.6 m)   Wt 173 lb 3.2 oz (78.6 kg)   BMI 30.68 kg/m  O2 sat 96% Wt Readings from Last 3 Encounters:  10/06/17 173 lb 3.2 oz (78.6 kg)  07/19/17 176 lb (79.8 kg)  07/11/17 175 lb (79.4 kg)    Gen: NAD, alert, cooperative with exam, congested EYES: no conjunctival injection, or no icterus ENT:  TMs dull gray b/l, OP without erythema LYMPH: no cervical LAD CV: NRRR Resp: rhonchi LLL, no wheezes, normal WOB Abd: +BS, soft, NTND. no guarding or organomegaly Ext: No edema, warm Neuro: Alert and oriented  Assessment & Plan:  Bradley Graham was seen today for headache, cough and nasal congestion.  Diagnoses and all orders for this visit:  Community acquired pneumonia, unspecified laterality Treat with below, return precautions discussed, has f/u already scheduled -     azithromycin (ZITHROMAX) 250 MG tablet; Take 2 the first day and then one each day after.   Follow up plan: Return if symptoms worsen or fail to improve. Assunta Found, MD [place]

## 2017-11-17 DIAGNOSIS — H1013 Acute atopic conjunctivitis, bilateral: Secondary | ICD-10-CM | POA: Diagnosis not present

## 2017-11-17 DIAGNOSIS — H40033 Anatomical narrow angle, bilateral: Secondary | ICD-10-CM | POA: Diagnosis not present

## 2017-12-21 DIAGNOSIS — H61303 Acquired stenosis of external ear canal, unspecified, bilateral: Secondary | ICD-10-CM | POA: Diagnosis not present

## 2017-12-21 DIAGNOSIS — H903 Sensorineural hearing loss, bilateral: Secondary | ICD-10-CM | POA: Diagnosis not present

## 2017-12-21 DIAGNOSIS — Q161 Congenital absence, atresia and stricture of auditory canal (external): Secondary | ICD-10-CM | POA: Diagnosis not present

## 2017-12-21 DIAGNOSIS — H6123 Impacted cerumen, bilateral: Secondary | ICD-10-CM | POA: Diagnosis not present

## 2018-02-01 DIAGNOSIS — Q213 Tetralogy of Fallot: Secondary | ICD-10-CM | POA: Diagnosis not present

## 2018-02-01 DIAGNOSIS — Z8679 Personal history of other diseases of the circulatory system: Secondary | ICD-10-CM | POA: Diagnosis not present

## 2018-02-01 DIAGNOSIS — Z8619 Personal history of other infectious and parasitic diseases: Secondary | ICD-10-CM | POA: Diagnosis not present

## 2018-02-01 DIAGNOSIS — N183 Chronic kidney disease, stage 3 (moderate): Secondary | ICD-10-CM | POA: Diagnosis not present

## 2018-02-01 DIAGNOSIS — Q255 Atresia of pulmonary artery: Secondary | ICD-10-CM | POA: Diagnosis not present

## 2018-02-01 DIAGNOSIS — I4510 Unspecified right bundle-branch block: Secondary | ICD-10-CM | POA: Diagnosis not present

## 2018-02-06 DIAGNOSIS — I77819 Aortic ectasia, unspecified site: Secondary | ICD-10-CM | POA: Diagnosis not present

## 2018-02-06 DIAGNOSIS — I517 Cardiomegaly: Secondary | ICD-10-CM | POA: Diagnosis not present

## 2018-02-06 DIAGNOSIS — Q213 Tetralogy of Fallot: Secondary | ICD-10-CM | POA: Diagnosis not present

## 2018-02-14 DIAGNOSIS — H02051 Trichiasis without entropian right upper eyelid: Secondary | ICD-10-CM | POA: Diagnosis not present

## 2018-02-14 DIAGNOSIS — H47013 Ischemic optic neuropathy, bilateral: Secondary | ICD-10-CM | POA: Diagnosis not present

## 2018-02-15 DIAGNOSIS — I509 Heart failure, unspecified: Secondary | ICD-10-CM | POA: Diagnosis not present

## 2018-02-15 DIAGNOSIS — I272 Pulmonary hypertension, unspecified: Secondary | ICD-10-CM | POA: Diagnosis not present

## 2018-02-15 DIAGNOSIS — Z8679 Personal history of other diseases of the circulatory system: Secondary | ICD-10-CM | POA: Diagnosis not present

## 2018-02-15 DIAGNOSIS — D631 Anemia in chronic kidney disease: Secondary | ICD-10-CM | POA: Diagnosis not present

## 2018-02-15 DIAGNOSIS — N183 Chronic kidney disease, stage 3 (moderate): Secondary | ICD-10-CM | POA: Diagnosis not present

## 2018-02-15 DIAGNOSIS — E215 Disorder of parathyroid gland, unspecified: Secondary | ICD-10-CM | POA: Diagnosis not present

## 2018-02-15 DIAGNOSIS — I130 Hypertensive heart and chronic kidney disease with heart failure and stage 1 through stage 4 chronic kidney disease, or unspecified chronic kidney disease: Secondary | ICD-10-CM | POA: Diagnosis not present

## 2018-02-15 DIAGNOSIS — I129 Hypertensive chronic kidney disease with stage 1 through stage 4 chronic kidney disease, or unspecified chronic kidney disease: Secondary | ICD-10-CM | POA: Diagnosis not present

## 2018-07-05 DIAGNOSIS — H9042 Sensorineural hearing loss, unilateral, left ear, with unrestricted hearing on the contralateral side: Secondary | ICD-10-CM | POA: Diagnosis not present

## 2018-07-05 DIAGNOSIS — K0889 Other specified disorders of teeth and supporting structures: Secondary | ICD-10-CM | POA: Diagnosis not present

## 2018-07-05 DIAGNOSIS — H61303 Acquired stenosis of external ear canal, unspecified, bilateral: Secondary | ICD-10-CM | POA: Diagnosis not present

## 2018-07-05 DIAGNOSIS — Z7189 Other specified counseling: Secondary | ICD-10-CM | POA: Diagnosis not present

## 2018-07-05 DIAGNOSIS — H6123 Impacted cerumen, bilateral: Secondary | ICD-10-CM | POA: Diagnosis not present

## 2018-07-20 ENCOUNTER — Other Ambulatory Visit: Payer: Self-pay | Admitting: Nurse Practitioner

## 2018-07-20 DIAGNOSIS — E785 Hyperlipidemia, unspecified: Secondary | ICD-10-CM

## 2018-10-05 ENCOUNTER — Other Ambulatory Visit: Payer: Self-pay

## 2018-10-05 NOTE — Patient Outreach (Signed)
[facility]) Care Management  10/05/2018  Bradley Graham 1971-01-14 [id number]   Medication Adherence call to  Bradley Graham  spoke with patient's mother she takes care of his medication and is not sure if he is suppose to be on Olmesartan 20 mg ,left a message at doctors office to clarify and if he is to send in a prescription to [facility]. [name] is showing past due under [place] health care ins.   [facility] Management Direct Dial [phone]  Fax [phone] [email or web address]

## 2018-10-11 ENCOUNTER — Ambulatory Visit (INDEPENDENT_AMBULATORY_CARE_PROVIDER_SITE_OTHER): Payer: Medicare Other | Admitting: Nurse Practitioner

## 2018-10-11 ENCOUNTER — Telehealth: Payer: Self-pay | Admitting: Nurse Practitioner

## 2018-10-11 ENCOUNTER — Encounter: Payer: Self-pay | Admitting: Nurse Practitioner

## 2018-10-11 VITALS — BP 110/68 | HR 112 | Temp 97.9°F | Ht 63.0 in | Wt 177.0 lb

## 2018-10-11 DIAGNOSIS — M79671 Pain in right foot: Secondary | ICD-10-CM | POA: Diagnosis not present

## 2018-10-11 DIAGNOSIS — M79672 Pain in left foot: Secondary | ICD-10-CM | POA: Diagnosis not present

## 2018-10-11 MED ORDER — PREDNISONE 20 MG PO TABS: ORAL_TABLET | ORAL | 0 refills | Status: DC

## 2018-10-11 NOTE — Telephone Encounter (Signed)
Cancelled at [facility], resent to [facility]

## 2018-10-11 NOTE — Patient Instructions (Signed)

## 2018-10-11 NOTE — Progress Notes (Signed)
   Subjective:    Patient ID: Bradley Graham, male    DOB: Oct 13, 1971, 47 y.o.   MRN: [id number]   Chief Complaint: bilateral foot pain   HPI Bilateral foot pain, left foot pain present for "awhile" and right foot pain started on Saturday; pain on left is sharp and right foot pain is pinching, no radiation to upper legs, pain rating 9-10/10, pain worse with activity, has not tried any medications or therapies for pain    Review of Systems  Constitutional: Negative.   HENT: Negative.   Eyes: Negative.   Respiratory: Negative.   Cardiovascular: Negative.   Gastrointestinal: Negative.   Endocrine: Negative.   Genitourinary: Negative.   Musculoskeletal: Positive for gait problem. Negative for arthralgias and myalgias.       Positive for bilateral foot pain   Allergic/Immunologic: Negative.   Hematological: Negative.   Psychiatric/Behavioral: Negative.        Objective:   Physical Exam  Constitutional: He is oriented to person, place, and time. He appears well-developed.  HENT:  Head: Normocephalic.  Eyes: EOM are normal.  Cardiovascular:  Murmur heard. Pulses:      Dorsalis pedis pulses are 2+ on the right side, and 2+ on the left side.       Posterior tibial pulses are 2+ on the right side, and 2+ on the left side.  Click present; TOF with repair  Pulmonary/Chest: Effort normal and breath sounds normal.  Abdominal: Soft.  Musculoskeletal: Normal range of motion.       Right foot: There is deformity (history of club foot). There is normal range of motion, no tenderness, no bony tenderness and no swelling.       Left foot: There is deformity (history of club foot). There is normal range of motion, no tenderness, no bony tenderness and no swelling.  Neurological: He is alert and oriented to person, place, and time.  Skin: Skin is warm and dry.  Psychiatric: He has a normal mood and affect. His behavior is normal.  Nursing note and vitals reviewed.    BP 110/68   Pulse  (!) 112   Temp 97.9 F (36.6 C) (Oral)   Ht '5\' 3"'$  (1.6 m)   Wt 177 lb (80.3 kg)   BMI 31.35 kg/m   CrCl 58     Assessment & Plan:  Ashtian L Marcou in today with chief complaint of bilateral foot pain   1. Foot pain, bilateral Rest Will call with lab work *wanted to confirm gout before giving allopurinol or uloric- and also wanted to make sure kidney function  - BMP8+EGFR - Uric acid RTO prn  Mary-Margaret Hassell Done, FNP

## 2018-10-12 LAB — BMP8+EGFR
BUN/Creatinine Ratio: 14 (ref 9–20)
BUN: 34 mg/dL — ABNORMAL HIGH (ref 6–24)
CHLORIDE: 99 mmol/L (ref 96–106)
CO2: 20 mmol/L (ref 20–29)
Calcium: 9.7 mg/dL (ref 8.7–10.2)
Creatinine, Ser: 2.5 mg/dL — ABNORMAL HIGH (ref 0.76–1.27)
GFR calc Af Amer: 34 mL/min/{1.73_m2} — ABNORMAL LOW (ref >59)
GFR calc non Af Amer: 29 mL/min/{1.73_m2} — ABNORMAL LOW (ref >59)
GLUCOSE: 172 mg/dL — AB (ref 65–99)
POTASSIUM: 4.9 mmol/L (ref 3.5–5.2)
Sodium: 142 mmol/L (ref 134–144)

## 2018-10-12 LAB — URIC ACID: URIC ACID: 9.9 mg/dL — AB (ref 3.7–8.6)

## 2018-10-21 ENCOUNTER — Other Ambulatory Visit: Payer: Self-pay | Admitting: Nurse Practitioner

## 2018-10-21 DIAGNOSIS — E785 Hyperlipidemia, unspecified: Secondary | ICD-10-CM

## 2018-11-12 DIAGNOSIS — Z8719 Personal history of other diseases of the digestive system: Secondary | ICD-10-CM | POA: Diagnosis not present

## 2018-11-12 DIAGNOSIS — I272 Pulmonary hypertension, unspecified: Secondary | ICD-10-CM | POA: Diagnosis not present

## 2018-11-12 DIAGNOSIS — Z8679 Personal history of other diseases of the circulatory system: Secondary | ICD-10-CM | POA: Diagnosis not present

## 2018-11-12 DIAGNOSIS — M109 Gout, unspecified: Secondary | ICD-10-CM | POA: Diagnosis not present

## 2018-11-12 DIAGNOSIS — N170 Acute kidney failure with tubular necrosis: Secondary | ICD-10-CM | POA: Diagnosis not present

## 2018-11-12 DIAGNOSIS — N183 Chronic kidney disease, stage 3 (moderate): Secondary | ICD-10-CM | POA: Diagnosis not present

## 2018-11-12 DIAGNOSIS — J9690 Respiratory failure, unspecified, unspecified whether with hypoxia or hypercapnia: Secondary | ICD-10-CM | POA: Diagnosis not present

## 2018-11-12 DIAGNOSIS — Z79899 Other long term (current) drug therapy: Secondary | ICD-10-CM | POA: Diagnosis not present

## 2018-11-12 DIAGNOSIS — Q213 Tetralogy of Fallot: Secondary | ICD-10-CM | POA: Diagnosis not present

## 2018-11-12 DIAGNOSIS — E785 Hyperlipidemia, unspecified: Secondary | ICD-10-CM | POA: Diagnosis not present

## 2018-11-12 DIAGNOSIS — D631 Anemia in chronic kidney disease: Secondary | ICD-10-CM | POA: Diagnosis not present

## 2018-11-12 DIAGNOSIS — Z9889 Other specified postprocedural states: Secondary | ICD-10-CM | POA: Diagnosis not present

## 2018-11-12 DIAGNOSIS — I129 Hypertensive chronic kidney disease with stage 1 through stage 4 chronic kidney disease, or unspecified chronic kidney disease: Secondary | ICD-10-CM | POA: Diagnosis not present

## 2018-11-12 DIAGNOSIS — Z23 Encounter for immunization: Secondary | ICD-10-CM | POA: Diagnosis not present

## 2018-11-12 DIAGNOSIS — Z8739 Personal history of other diseases of the musculoskeletal system and connective tissue: Secondary | ICD-10-CM | POA: Diagnosis not present

## 2018-11-16 ENCOUNTER — Encounter: Payer: Self-pay | Admitting: Family

## 2018-11-16 ENCOUNTER — Ambulatory Visit (INDEPENDENT_AMBULATORY_CARE_PROVIDER_SITE_OTHER): Payer: Medicare Other | Admitting: Family

## 2018-11-16 VITALS — BP 128/76 | HR 106 | Temp 97.9°F | Ht 63.0 in | Wt 169.4 lb

## 2018-11-16 DIAGNOSIS — H6591 Unspecified nonsuppurative otitis media, right ear: Secondary | ICD-10-CM

## 2018-11-16 MED ORDER — CIPROFLOXACIN HCL 250 MG PO TABS: 250.0000 mg | ORAL_TABLET | Freq: Two times a day (BID) | ORAL | 0 refills | Status: DC

## 2018-11-16 NOTE — Patient Instructions (Signed)

## 2018-11-16 NOTE — Progress Notes (Signed)
Subjective:    Patient ID: Bradley Graham, male    DOB: 1971/08/19, 47 y.o.   MRN: [id number]  Chief Complaint  Patient presents with  . ears plugged  . Cough    chest congestion    Cough  This is a new problem. The current episode started in the past 7 days. The problem has been gradually worsening. The problem occurs every few minutes. The cough is non-productive. Associated symptoms include ear congestion, ear pain, nasal congestion, postnasal drip and a sore throat. Pertinent negatives include no chills, fever, headaches, shortness of breath or wheezing. Associated symptoms comments: Discharge from right ear . He has tried rest for the symptoms. The treatment provided mild relief. There is no history of asthma or COPD.      Review of Systems  Constitutional: Negative for chills and fever.  HENT: Positive for ear pain, postnasal drip and sore throat.   Respiratory: Positive for cough. Negative for shortness of breath and wheezing.   Neurological: Negative for headaches.  All other systems reviewed and are negative.      Objective:   Physical Exam Vitals signs reviewed.  Constitutional:      General: He is not in acute distress.    Appearance: He is well-developed.  HENT:     Head: Normocephalic.     Right Ear: External ear normal. Drainage and swelling present.     Left Ear: External ear normal. Drainage present.     Nose: Congestion and rhinorrhea present.  Eyes:     General:        Right eye: No discharge.        Left eye: No discharge.     Pupils: Pupils are equal, round, and reactive to light.  Neck:     Musculoskeletal: Normal range of motion and neck supple.     Thyroid: No thyromegaly.  Cardiovascular:     Rate and Rhythm: Normal rate and regular rhythm.     Heart sounds: Normal heart sounds. No murmur.  Pulmonary:     Effort: Pulmonary effort is normal. No respiratory distress.     Breath sounds: Normal breath sounds. No wheezing.  Abdominal:   General: Bowel sounds are normal. There is no distension.     Palpations: Abdomen is soft.     Tenderness: There is no abdominal tenderness.  Musculoskeletal: Normal range of motion.        General: No tenderness.  Skin:    General: Skin is warm and dry.     Findings: No erythema or rash.  Neurological:     Mental Status: He is alert and oriented to person, place, and time.     Cranial Nerves: No cranial nerve deficit.     Deep Tendon Reflexes: Reflexes are normal and symmetric.  Psychiatric:        Behavior: Behavior normal.        Thought Content: Thought content normal.        Judgment: Judgment normal.      BP 128/76   Pulse (!) 106   Temp 97.9 F (36.6 C) (Oral)   Ht 5\' 3"  (1.6 m)   Wt 169 lb 6.4 oz (76.8 kg)   BMI 30.01 kg/m      Assessment & Plan:  Bradley Graham comes in today with chief complaint of ears plugged and Cough (chest congestion)   Diagnosis and orders addressed:  1. Other nonsuppurative otitis media of right ear, unspecified chronicity - Take meds as  prescribed - Use a cool mist humidifier  -Use saline nose sprays frequently -Force fluids -For fever or aces or pains- take tylenol or ibuprofen. -RTO if symptoms worsen or do not improve - ciprofloxacin (CIPRO) 250 MG tablet; Take 1 tablet (250 mg total) by mouth 2 (two) times daily.  Dispense: 10 tablet; Refill: 0   Evelina Dun, FNP

## 2018-12-11 ENCOUNTER — Ambulatory Visit (INDEPENDENT_AMBULATORY_CARE_PROVIDER_SITE_OTHER): Payer: Medicare Other | Admitting: Family Medicine

## 2018-12-11 ENCOUNTER — Encounter: Payer: Self-pay | Admitting: Family Medicine

## 2018-12-11 VITALS — BP 110/68 | HR 95 | Temp 97.8°F | Ht 63.0 in | Wt 170.0 lb

## 2018-12-11 DIAGNOSIS — Z7689 Persons encountering health services in other specified circumstances: Secondary | ICD-10-CM | POA: Diagnosis not present

## 2018-12-11 DIAGNOSIS — M109 Gout, unspecified: Secondary | ICD-10-CM

## 2018-12-11 MED ORDER — PREDNISONE 10 MG PO TABS: ORAL_TABLET | ORAL | 0 refills | Status: DC

## 2018-12-11 NOTE — Patient Instructions (Signed)
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
## 2018-12-12 ENCOUNTER — Other Ambulatory Visit: Payer: Self-pay

## 2018-12-12 DIAGNOSIS — M109 Gout, unspecified: Secondary | ICD-10-CM

## 2018-12-12 LAB — CMP14+EGFR
ALT: 14 IU/L (ref 0–44)
AST: 21 IU/L (ref 0–40)
Albumin/Globulin Ratio: 1.9 (ref 1.2–2.2)
Albumin: 4.4 g/dL (ref 3.5–5.5)
Alkaline Phosphatase: 89 IU/L (ref 39–117)
BUN/Creatinine Ratio: 13 (ref 9–20)
BUN: 26 mg/dL — ABNORMAL HIGH (ref 6–24)
Bilirubin Total: 0.5 mg/dL (ref 0.0–1.2)
CO2: 24 mmol/L (ref 20–29)
Calcium: 9 mg/dL (ref 8.7–10.2)
Chloride: 102 mmol/L (ref 96–106)
Creatinine, Ser: 2.04 mg/dL — ABNORMAL HIGH (ref 0.76–1.27)
GFR calc Af Amer: 44 mL/min/{1.73_m2} — ABNORMAL LOW (ref >59)
GFR calc non Af Amer: 38 mL/min/{1.73_m2} — ABNORMAL LOW (ref >59)
Globulin, Total: 2.3 g/dL (ref 1.5–4.5)
Glucose: 93 mg/dL (ref 65–99)
POTASSIUM: 5.4 mmol/L — AB (ref 3.5–5.2)
Sodium: 141 mmol/L (ref 134–144)
Total Protein: 6.7 g/dL (ref 6.0–8.5)

## 2018-12-12 LAB — CBC WITH DIFFERENTIAL/PLATELET
Basophils Absolute: 0.1 10*3/uL (ref 0.0–0.2)
Basos: 1 %
EOS (ABSOLUTE): 0.8 10*3/uL — ABNORMAL HIGH (ref 0.0–0.4)
EOS: 9 %
HEMATOCRIT: 37.1 % — AB (ref 37.5–51.0)
Hemoglobin: 12.9 g/dL — ABNORMAL LOW (ref 13.0–17.7)
Immature Grans (Abs): 0 10*3/uL (ref 0.0–0.1)
Immature Granulocytes: 0 %
Lymphocytes Absolute: 1.8 10*3/uL (ref 0.7–3.1)
Lymphs: 20 %
MCH: 31.4 pg (ref 26.6–33.0)
MCHC: 34.8 g/dL (ref 31.5–35.7)
MCV: 90 fL (ref 79–97)
MONOS ABS: 0.9 10*3/uL (ref 0.1–0.9)
Monocytes: 10 %
Neutrophils Absolute: 5.3 10*3/uL (ref 1.4–7.0)
Neutrophils: 60 %
Platelets: 136 10*3/uL — ABNORMAL LOW (ref 150–450)
RBC: 4.11 x10E6/uL — ABNORMAL LOW (ref 4.14–5.80)
RDW: 12.5 % (ref 11.6–15.4)
WBC: 8.8 10*3/uL (ref 3.4–10.8)

## 2018-12-14 LAB — URIC ACID: Uric Acid: 9 mg/dL — ABNORMAL HIGH (ref 3.7–8.6)

## 2018-12-31 ENCOUNTER — Encounter: Payer: Self-pay | Admitting: Family Medicine

## 2018-12-31 ENCOUNTER — Ambulatory Visit (INDEPENDENT_AMBULATORY_CARE_PROVIDER_SITE_OTHER): Payer: Medicare Other | Admitting: Family Medicine

## 2018-12-31 VITALS — BP 104/69 | HR 94 | Temp 97.6°F | Ht 63.0 in | Wt 169.0 lb

## 2018-12-31 DIAGNOSIS — E875 Hyperkalemia: Secondary | ICD-10-CM

## 2018-12-31 DIAGNOSIS — H6591 Unspecified nonsuppurative otitis media, right ear: Secondary | ICD-10-CM | POA: Diagnosis not present

## 2018-12-31 LAB — BMP8+EGFR
BUN/Creatinine Ratio: 14 (ref 9–20)
BUN: 26 mg/dL — ABNORMAL HIGH (ref 6–24)
CO2: 22 mmol/L (ref 20–29)
Calcium: 9.5 mg/dL (ref 8.7–10.2)
Chloride: 101 mmol/L (ref 96–106)
Creatinine, Ser: 1.83 mg/dL — ABNORMAL HIGH (ref 0.76–1.27)
GFR calc Af Amer: 50 mL/min/{1.73_m2} — ABNORMAL LOW (ref >59)
GFR, EST NON AFRICAN AMERICAN: 43 mL/min/{1.73_m2} — AB (ref >59)
Glucose: 148 mg/dL — ABNORMAL HIGH (ref 65–99)
Potassium: 4.7 mmol/L (ref 3.5–5.2)
Sodium: 141 mmol/L (ref 134–144)

## 2018-12-31 MED ORDER — CIPROFLOXACIN HCL 250 MG PO TABS: 250.0000 mg | ORAL_TABLET | Freq: Two times a day (BID) | ORAL | 0 refills | Status: AC

## 2018-12-31 NOTE — Progress Notes (Signed)
Subjective:    Patient ID: Bradley Graham, male    DOB: [birth year], 48 y.o.   MRN: [id number]  Chief Complaint:  Right ear pain and running fluid, sinus congestion   HPI: JOSHAUA Graham is a 48 y.o. male presenting on 12/31/2018 for Right ear pain and running fluid, sinus congestion  Pt presents today with complaint of right ear pain and drainage. This started on Wednesday night and has not improved. Has not had a fever. States he has had congestion and rhinorrhea. He states he also needs his potassium rechecked because it was elevated at his last visit. Potassium was 5.45 on 12/11/2018. He denies palpitations, muscle cramps or spasms, or chest pain.   Relevant past medical, surgical, family, and social history reviewed and updated as indicated.  Allergies and medications reviewed and updated.   Past Medical History:  Diagnosis Date  . Atrial fibrillation ([place])   . CHF (congestive heart failure) ([place])   . CKD (chronic kidney disease)   . Club foot of both feet   . Hypertension   . Left ventricular hypertrophy   . Right ventricular hypertrophy   . Speech impediment   . Tetralogy of Fallot    s/p waterson shunt  . Vitamin D deficiency     Past Surgical History:  Procedure Laterality Date  . ABDOMINAL HERNIA REPAIR     [facility]   . ADENOIDECTOMY    . MOUTH SURGERY    . scoliosis    . VSD REPAIR    . WRIST SURGERY Left     Social History   Socioeconomic History  . Marital status: Single    Spouse name: Not on file  . Number of children: Not on file  . Years of education: Not on file  . Highest education level: Not on file  Occupational History  . Not on file  Social Needs  . Financial resource strain: Not on file  . Food insecurity:    Worry: Not on file    Inability: Not on file  . Transportation needs:    Medical: Not on file    Non-medical: Not on file  Tobacco Use  . Smoking status: Never Smoker  . Smokeless tobacco: Never Used  Substance and Sexual  Activity  . Alcohol use: No  . Drug use: No  . Sexual activity: Never  Lifestyle  . Physical activity:    Days per week: Not on file    Minutes per session: Not on file  . Stress: Not on file  Relationships  . Social connections:    Talks on phone: Not on file    Gets together: Not on file    Attends religious service: Not on file    Active member of club or organization: Not on file    Attends meetings of clubs or organizations: Not on file    Relationship status: Not on file  . Intimate partner violence:    Fear of current or ex partner: Not on file    Emotionally abused: Not on file    Physically abused: Not on file    Forced sexual activity: Not on file  Other Topics Concern  . Not on file  Social History Narrative  . Not on file    Outpatient Encounter Medications as of 12/31/2018  Medication Sig  . aspirin 81 MG tablet Take 81 mg by mouth daily.  Marland Kitchen docusate sodium (COLACE) 100 MG capsule Take 100 mg by mouth daily.  Marland Kitchen  famotidine (PEPCID) 10 MG tablet Take 10 mg by mouth 2 (two) times daily.  . fluticasone (FLONASE) 50 MCG/ACT nasal spray Place 1 spray into both nostrils 2 (two) times daily.  Marland Kitchen loratadine (CLARITIN) 10 MG tablet Take 10 mg by mouth daily.  Marland Kitchen lovastatin (MEVACOR) 20 MG tablet TAKE 1 TABLET BY MOUTH EVERYDAY AT BEDTIME  . olmesartan (BENICAR) 40 MG tablet Take 1 tablet (40 mg total) by mouth daily.  . ciprofloxacin (CIPRO) 250 MG tablet Take 1 tablet (250 mg total) by mouth 2 (two) times daily for 5 days.  . [DISCONTINUED] predniSONE (DELTASONE) 10 MG tablet Take 5 daily for 2 days followed by 4,3,2 and 1 for 2 days each.  . [DISCONTINUED] ranitidine (ZANTAC 75) 75 MG tablet Take 75 mg by mouth 2 (two) times daily.   No facility-administered encounter medications on file as of 12/31/2018.     Allergies  Allergen Reactions  . Aleve [Naproxen Sodium]   . Bactrim [Sulfamethoxazole-Trimethoprim]   . Erythromycin   . Heparin     Induced thrombopenia  .  Nsaids   . Penicillins   . Cortisporin [Bacitra-Neomycin-Polymyxin-Hc] Other (See Comments)    Burned ear canal per mom    Review of Systems  Constitutional: Negative for chills, fatigue and fever.  HENT: Positive for congestion, ear discharge, ear pain and rhinorrhea. Negative for sore throat.   Respiratory: Negative for cough, choking, chest tightness and shortness of breath.   Cardiovascular: Negative for chest pain, palpitations and leg swelling.  Musculoskeletal: Negative for arthralgias and myalgias.  Neurological: Negative for weakness and headaches.  Psychiatric/Behavioral: Negative for confusion.  All other systems reviewed and are negative.       Objective:    BP 104/69   Pulse 94   Temp 97.6 F (36.4 C) (Oral)   Ht 5' 3" (1.6 m)   Wt 169 lb (76.7 kg)   BMI 29.94 kg/m    Wt Readings from Last 3 Encounters:  12/31/18 169 lb (76.7 kg)  12/11/18 170 lb (77.1 kg)  11/16/18 169 lb 6.4 oz (76.8 kg)    Physical Exam Vitals signs and nursing note reviewed.  Constitutional:      General: He is not in acute distress.    Appearance: He is well-groomed. He is not ill-appearing or toxic-appearing.  HENT:     Head: Normocephalic and atraumatic.     Right Ear: Drainage present. Tympanic membrane is erythematous.     Left Ear: Tympanic membrane is injected. Tympanic membrane is not erythematous or bulging.     Nose: Congestion and rhinorrhea present. Rhinorrhea is clear.     Right Sinus: No maxillary sinus tenderness or frontal sinus tenderness.     Left Sinus: No maxillary sinus tenderness or frontal sinus tenderness.     Mouth/Throat:     Lips: Pink.     Mouth: Mucous membranes are moist.     Pharynx: Posterior oropharyngeal erythema present. No pharyngeal swelling, oropharyngeal exudate or uvula swelling.     Tonsils: No tonsillar exudate or tonsillar abscesses.  Neck:     Musculoskeletal: Neck supple.  Cardiovascular:     Rate and Rhythm: Normal rate and regular  rhythm.     Heart sounds: Murmur present. Systolic murmur present with a grade of 3/6.  Pulmonary:     Effort: Pulmonary effort is normal.     Breath sounds: Normal breath sounds.  Skin:    General: Skin is warm and dry.     Capillary Refill:  Capillary refill takes less than 2 seconds.  Neurological:     General: No focal deficit present.     Mental Status: He is alert. Mental status is at baseline.  Psychiatric:        Mood and Affect: Mood normal.        Behavior: Behavior normal. Behavior is cooperative.        Thought Content: Thought content normal.        Judgment: Judgment normal.     Results for orders placed or performed in visit on 12/11/18  CBC with Differential/Platelet  Result Value Ref Range   WBC 8.8 3.4 - 10.8 x10E3/uL   RBC 4.11 (L) 4.14 - 5.80 x10E6/uL   Hemoglobin 12.9 (L) 13.0 - 17.7 g/dL   Hematocrit 37.1 (L) 37.5 - 51.0 %   MCV 90 79 - 97 fL   MCH 31.4 26.6 - 33.0 pg   MCHC 34.8 31.5 - 35.7 g/dL   RDW 12.5 11.6 - 15.4 %   Platelets 136 (L) 150 - 450 x10E3/uL   Neutrophils 60 Not Estab. %   Lymphs 20 Not Estab. %   Monocytes 10 Not Estab. %   Eos 9 Not Estab. %   Basos 1 Not Estab. %   Neutrophils Absolute 5.3 1.4 - 7.0 x10E3/uL   Lymphocytes Absolute 1.8 0.7 - 3.1 x10E3/uL   Monocytes Absolute 0.9 0.1 - 0.9 x10E3/uL   EOS (ABSOLUTE) 0.8 (H) 0.0 - 0.4 x10E3/uL   Basophils Absolute 0.1 0.0 - 0.2 x10E3/uL   Immature Granulocytes 0 Not Estab. %   Immature Grans (Abs) 0.0 0.0 - 0.1 x10E3/uL  CMP14+EGFR  Result Value Ref Range   Glucose 93 65 - 99 mg/dL   BUN 26 (H) 6 - 24 mg/dL   Creatinine, Ser 2.04 (H) 0.76 - 1.27 mg/dL   GFR calc non Af Amer 38 (L) >59 mL/min/1.73   GFR calc Af Amer 44 (L) >59 mL/min/1.73   BUN/Creatinine Ratio 13 9 - 20   Sodium 141 134 - 144 mmol/L   Potassium 5.4 (H) 3.5 - 5.2 mmol/L   Chloride 102 96 - 106 mmol/L   CO2 24 20 - 29 mmol/L   Calcium 9.0 8.7 - 10.2 mg/dL   Total Protein 6.7 6.0 - 8.5 g/dL   Albumin 4.4 3.5  - 5.5 g/dL   Globulin, Total 2.3 1.5 - 4.5 g/dL   Albumin/Globulin Ratio 1.9 1.2 - 2.2   Bilirubin Total 0.5 0.0 - 1.2 mg/dL   Alkaline Phosphatase 89 39 - 117 IU/L   AST 21 0 - 40 IU/L   ALT 14 0 - 44 IU/L  Uric acid  Result Value Ref Range   Uric Acid 9.0 (H) 3.7 - 8.6 mg/dL       Pertinent labs & imaging results that were available during my care of the patient were reviewed by me and considered in my medical decision making.  Assessment & Plan:  Shravan was seen today for right ear pain and running fluid, sinus congestion.  Diagnoses and all orders for this visit:  Other nonsuppurative otitis media of right ear, unspecified chronicity Has appointment with ENT next week. Medications as prescribed.  -     ciprofloxacin (CIPRO) 250 MG tablet; Take 1 tablet (250 mg total) by mouth 2 (two) times daily for 5 days.  Hyperkalemia Will recheck labs today -     BMP8+EGFR     Continue all other maintenance medications.  Follow up plan: Return if symptoms  worsen or fail to improve.  Educational handout given for otitis media  The above assessment and management plan was discussed with the patient. The patient verbalized understanding of and has agreed to the management plan. Patient is aware to call the clinic if symptoms persist or worsen. Patient is aware when to return to the clinic for a follow-up visit. Patient educated on when it is appropriate to go to the emergency department.   Monia Pouch, FNP-C [facility] [phone]

## 2018-12-31 NOTE — Patient Instructions (Signed)

## 2019-01-04 ENCOUNTER — Telehealth: Payer: Self-pay | Admitting: Nurse Practitioner

## 2019-01-04 NOTE — Telephone Encounter (Signed)
Bradley Graham aware of lab results

## 2019-02-04 DIAGNOSIS — H838X3 Other specified diseases of inner ear, bilateral: Secondary | ICD-10-CM | POA: Diagnosis not present

## 2019-02-04 DIAGNOSIS — H60391 Other infective otitis externa, right ear: Secondary | ICD-10-CM | POA: Diagnosis not present

## 2019-02-04 DIAGNOSIS — H6123 Impacted cerumen, bilateral: Secondary | ICD-10-CM | POA: Diagnosis not present

## 2019-02-07 ENCOUNTER — Ambulatory Visit (INDEPENDENT_AMBULATORY_CARE_PROVIDER_SITE_OTHER): Payer: Medicare Other | Admitting: *Deleted

## 2019-02-07 VITALS — BP 114/64 | HR 94 | Temp 98.2°F | Ht 63.0 in | Wt 167.2 lb

## 2019-02-07 DIAGNOSIS — Z0000 Encounter for general adult medical examination without abnormal findings: Secondary | ICD-10-CM | POA: Diagnosis not present

## 2019-02-07 DIAGNOSIS — Z23 Encounter for immunization: Secondary | ICD-10-CM

## 2019-02-07 NOTE — Progress Notes (Addendum)
Subjective:   Bradley Graham is a 48 y.o. male who presents for a [employer] Annual Wellness Visit. Bradley Graham is accompanied by his mother today who helped provide some of his health history. He lives at home with his mother and father. He has 2 sisters and 3 living brothers and 1 that is deceased. Patient has spastic cerebal palsy, chronic kidney disease, and congential heart defects. He does feel comfortable driving to [employer] and running small errands on his own. He enjoys playing the [place]. He is involved with his church.   Patient Care Team: Chevis Pretty, FNP as PCP - General (Nurse Practitioner) Wilfrid Lund, MD (Otolaryngology) Ophelia Shoulder, MD (Pulmonary Disease) Plonk, Vivien Presto, MD as Referring Physician (Otolaryngology) Mercie Eon, MD (Internal Medicine) Thurman Coyer, DO as Consulting Physician (Sports Medicine) Joseph Berkshire, Micheal Likens, MD as Referring Physician (Nephrology)  Hospitalizations, surgeries, and ER visits in previous 12 months No hospitalizations, ER visits, or surgeries this past year.   Review of Systems    Patient reports that his overall health is better compared to last year.  Cardiac Risk Factors include: male gender;Other (see comment), Risk factor comments: Deformity of left foot, Heart Disease    All other systems negative       Current Medications (verified) Outpatient Encounter Medications as of 02/07/2019  Medication Sig  . aspirin 81 MG tablet Take 81 mg by mouth daily.  Marland Kitchen docusate sodium (COLACE) 100 MG capsule Take 100 mg by mouth daily.  . famotidine (PEPCID) 10 MG tablet Take 10 mg by mouth 2 (two) times daily.  . fluticasone (FLONASE) 50 MCG/ACT nasal spray Place 1 spray into both nostrils 2 (two) times daily.  Marland Kitchen loratadine (CLARITIN) 10 MG tablet Take 10 mg by mouth daily.  Marland Kitchen lovastatin (MEVACOR) 20 MG tablet TAKE 1 TABLET BY MOUTH EVERYDAY AT BEDTIME  . olmesartan (BENICAR) 40 MG tablet Take 1 tablet (40  mg total) by mouth daily.   No facility-administered encounter medications on file as of 02/07/2019.     Allergies (verified) Aleve [naproxen sodium]; Bactrim [sulfamethoxazole-trimethoprim]; Erythromycin; Heparin; Nsaids; Penicillins; and Cortisporin [bacitra-neomycin-polymyxin-hc]   History: Past Medical History:  Diagnosis Date  . Atrial fibrillation ([place])   . CHF (congestive heart failure) ([place])   . CKD (chronic kidney disease)   . Club foot of both feet   . Hypertension   . Left ventricular hypertrophy   . Right ventricular hypertrophy   . Speech impediment   . Tetralogy of Fallot    s/p waterson shunt  . Vitamin D deficiency    Past Surgical History:  Procedure Laterality Date  . ABDOMINAL HERNIA REPAIR     [facility]   . ADENOIDECTOMY    . MOUTH SURGERY    . scoliosis    . VSD REPAIR    . WRIST SURGERY Left    Family History  Problem Relation Age of Onset  . Hyperlipidemia Mother   . Hyperthyroidism Mother   . Diabetes insipidus Mother   . Diabetes Father   . Hyperlipidemia Father   . Hypertension Father   . Hyperthyroidism Sister   . Cancer Maternal Grandfather 16       breast cancer  . Pernicious anemia Maternal Grandfather   . Cancer Cousin        colon cance   Social History   Socioeconomic History  . Marital status: Single    Spouse name: Not on file  . Number of children: Not on file  .  Years of education: Not on file  . Highest education level: Not on file  Occupational History  . Not on file  Social Needs  . Financial resource strain: Not hard at all  . Food insecurity:    Worry: Never true    Inability: Never true  . Transportation needs:    Medical: No    Non-medical: No  Tobacco Use  . Smoking status: Never Smoker  . Smokeless tobacco: Never Used  Substance and Sexual Activity  . Alcohol use: No  . Drug use: No  . Sexual activity: Never  Lifestyle  . Physical activity:    Days per week: 3 days    Minutes per session: 60 min  .  Stress: Not at all  Relationships  . Social connections:    Talks on phone: More than three times a week    Gets together: More than three times a week    Attends religious service: More than 4 times per year    Active member of club or organization: No    Attends meetings of clubs or organizations: Never    Relationship status: Never married  Other Topics Concern  . Not on file  Social History Narrative  . Not on file     Activities of Daily Living In your present state of health, do you have any difficulty performing the following activities: 02/07/2019  Hearing? N  Comment Patient does go to [facility] and have frequent hearing checks   Vision? Y  Comment Patient does wear glasses. He does not have any vision at the bottom of the eyes. He does have yearly eye exams   Difficulty concentrating or making decisions? N  Walking or climbing stairs? Y  Comment Yes at times due to left foot. He also gets SOB walking up and down stairs  Dressing or bathing? N  Doing errands, shopping? Y  Comment He will venture out to [employer] alone but does not like to go to Dr. Visits alone.   Preparing Food and eating ? N  Using the Toilet? N  In the past six months, have you accidently leaked urine? N  Do you have problems with loss of bowel control? N  Managing your Medications? N  Managing your Finances? Y  Comment Mother handles his finances   Housekeeping or managing your Housekeeping? N  Some recent data might be hidden     Exercise Current Exercise Habits: Home exercise routine, Type of exercise: treadmill;Other - see comments(Bicycling ), Time (Minutes): 30, Frequency (Times/Week): 3, Weekly Exercise (Minutes/Week): 90, Intensity: Mild, Exercise limited by: cardiac condition(s);orthopedic condition(s)  Diet Consumes 3 meals a day and 1 snacks a day.  The patient feels that he mostly follow a Low fat, Low Sodium diet.  Diet History He tries to maintain a low purine diet to help keep  his gout under control.   Depression Screen PHQ 2/9 Scores 02/07/2019 12/31/2018 12/11/2018 11/16/2018 10/11/2018 10/06/2017 05/02/2017  PHQ - 2 Score 0 0 0 0 0 0 0     Fall Risk Fall Risk  10/11/2018 10/06/2017 05/02/2017 05/13/2016 10/20/2015  Falls in the past year? 0 No No Yes No  Number falls in past yr: - - - 2 or more -  Injury with Fall? - - - Yes -  Comment - - - hurt left knee -     Objective:    Today's Vitals   02/07/19 1032  BP: 114/64  Pulse: 94  Temp: 98.2 F (36.8 C)  TempSrc: Oral  Weight: 167 lb 3.2 oz (75.8 kg)  Height: 5\' 3"  (1.6 m)   Body mass index is 29.62 kg/m.  Advanced Directives 02/07/2019 06/27/2016 05/06/2015 03/16/2015  Does Patient Have a Medical Advance Directive? No;Yes No Yes No  Type of Advance Directive Healthcare Power of [place] in Chart? No - copy requested - No - copy requested -  Would patient like information on creating a medical advance directive? Yes (MAU/Ambulatory/Procedural Areas - Information given) - - No - patient declined information    Hearing/Vision  Patient is unable to see from the bottom of his eyes he also has trouble hearing low pitched and high pitched sounds.  Cognitive Function: MMSE - Mini Mental State Exam 02/07/2019 07/19/2017 05/06/2015  Not completed: - - Unable to complete  Orientation to time 4 5 -  Orientation to Place 4 5 -  Registration 3 3 -  Attention/ Calculation 5 4 -  Recall 3 3 -  Language- name 2 objects 2 2 -  Language- repeat 1 1 -  Language- follow 3 step command 3 3 -  Language- read & follow direction 1 1 -  Write a sentence 1 1 -  Copy design 1 0 -  Total score 28 28 -       Normal Cognitive Function Screening: No: Patient scored a 28 out of 30    Immunizations and Health Maintenance Immunization History  Administered Date(s) Administered  . Influenza,inj,Quad PF,6+ Mos 09/29/2016, 08/10/2017  . Influenza-Unspecified  10/20/2014, 10/08/2015, 11/12/2018   Health Maintenance Due  Topic Date Due  . HIV Screening  04/13/1986  . TETANUS/TDAP  04/13/1990   Health Maintenance  Topic Date Due  . HIV Screening  04/13/1986  . TETANUS/TDAP  04/13/1990  . INFLUENZA VACCINE  Completed        Assessment:   This is a routine wellness examination for Bradley Graham.    Plan:    Goals    . Exercise 150 minutes per week (moderate activity)     Continue to exercise for at least 30 minutes daily    . Exercise 3x per week (30 min per time)     Continue exercising three times a week      . Have 3 meals a day        Health Maintenance & Additional Screening Recommendations: Td vaccine  Lung: Low Dose CT Chest recommended if Age 74-80 years, 30 pack-year currently smoking OR have quit w/in 15years. Patient does not qualify. Hepatitis C Screening recommended: no HIV screening recommended: no  Today's Orders Orders Placed This Encounter  Procedures  . Tdap vaccine greater than or equal to 7yo IM    Keep f/u with Chevis Pretty, FNP and any other specialty appointments you may have Continue current medications Move carefully to avoid falls. Use assistive devices like a cane or walker if needed. Aim for at least 150 minutes of moderate activity a week. This can be done with chair exercises if necessary. Read or work on puzzles daily Stay connected with friends and family  I have personally reviewed and noted the following in the patient's chart:   . Medical and social history . Use of alcohol, tobacco or illicit drugs  . Current medications and supplements . Functional ability and status . Nutritional status . Physical activity . Advanced directives . List of other physicians . Hospitalizations, surgeries, and ER visits in previous 12 months .  Vitals . Screenings to include cognitive, depression, and falls . Referrals and appointments  In addition, I have reviewed and discussed with patient  certain preventive protocols, quality metrics, and best practice recommendations. A written personalized care plan for preventive services as well as general preventive health recommendations were provided to patient.     Lynnea Ferrier, LPN  0/12/270   I have reviewed and agree with the above AWV documentation.   Mary-Margaret Hassell Done, FNP

## 2019-02-07 NOTE — Patient Instructions (Signed)
  [name] , Thank you for taking time to come for your Medicare Wellness Visit. I appreciate your ongoing commitment to your health goals. Please review the following plan we discussed and let me know if I can assist you in the future.   These are the goals we discussed: Goals    . Exercise 150 minutes per week (moderate activity)     Continue to exercise for at least 30 minutes daily    . Exercise 3x per week (30 min per time)     Continue exercising three times a week      . Have 3 meals a day       This is a list of the screening recommended for you and due dates:  Health Maintenance  Topic Date Due  . HIV Screening  04/13/1986  . Tetanus Vaccine  04/13/1990  . Flu Shot  Completed

## 2019-02-14 ENCOUNTER — Other Ambulatory Visit: Payer: Self-pay

## 2019-02-14 ENCOUNTER — Ambulatory Visit (INDEPENDENT_AMBULATORY_CARE_PROVIDER_SITE_OTHER): Payer: Medicare Other | Admitting: Family Medicine

## 2019-02-14 ENCOUNTER — Encounter: Payer: Self-pay | Admitting: Family Medicine

## 2019-02-14 VITALS — BP 119/77 | HR 105 | Temp 99.1°F | Ht 63.0 in | Wt 168.0 lb

## 2019-02-14 DIAGNOSIS — S40012A Contusion of left shoulder, initial encounter: Secondary | ICD-10-CM | POA: Diagnosis not present

## 2019-02-14 MED ORDER — BACLOFEN 10 MG PO TABS: 10.0000 mg | ORAL_TABLET | Freq: Three times a day (TID) | ORAL | 0 refills | Status: DC
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
## 2019-04-26 ENCOUNTER — Other Ambulatory Visit: Payer: Self-pay | Admitting: Nurse Practitioner

## 2019-04-26 DIAGNOSIS — E785 Hyperlipidemia, unspecified: Secondary | ICD-10-CM

## 2019-05-06 DIAGNOSIS — I129 Hypertensive chronic kidney disease with stage 1 through stage 4 chronic kidney disease, or unspecified chronic kidney disease: Secondary | ICD-10-CM | POA: Diagnosis not present

## 2019-05-06 DIAGNOSIS — N183 Chronic kidney disease, stage 3 (moderate): Secondary | ICD-10-CM | POA: Diagnosis not present

## 2019-05-06 DIAGNOSIS — M109 Gout, unspecified: Secondary | ICD-10-CM | POA: Diagnosis not present

## 2019-05-06 DIAGNOSIS — N170 Acute kidney failure with tubular necrosis: Secondary | ICD-10-CM | POA: Diagnosis not present

## 2019-05-06 DIAGNOSIS — E785 Hyperlipidemia, unspecified: Secondary | ICD-10-CM | POA: Diagnosis not present

## 2019-05-06 DIAGNOSIS — Z8709 Personal history of other diseases of the respiratory system: Secondary | ICD-10-CM | POA: Diagnosis not present

## 2019-05-07 ENCOUNTER — Other Ambulatory Visit: Payer: Self-pay | Admitting: Family Medicine

## 2019-05-07 ENCOUNTER — Other Ambulatory Visit: Payer: Medicare Other

## 2019-05-07 ENCOUNTER — Other Ambulatory Visit: Payer: Self-pay

## 2019-05-07 DIAGNOSIS — N183 Chronic kidney disease, stage 3 unspecified: Secondary | ICD-10-CM

## 2019-05-07 NOTE — Progress Notes (Signed)
Labs requested by Dr. Joseph Berkshire, placed per request.

## 2019-05-07 NOTE — Addendum Note (Signed)
Addended by: Baruch Gouty on: 05/07/2019 09:28 AM   Modules accepted: Orders

## 2019-05-08 LAB — URINALYSIS, COMPLETE
Bilirubin, UA: NEGATIVE
Glucose, UA: NEGATIVE
Ketones, UA: NEGATIVE
Leukocytes,UA: NEGATIVE
Nitrite, UA: NEGATIVE
Protein,UA: NEGATIVE
RBC, UA: NEGATIVE
Specific Gravity, UA: 1.015 (ref 1.005–1.030)
Urobilinogen, Ur: 0.2 mg/dL (ref 0.2–1.0)
pH, UA: 5.5 (ref 5.0–7.5)

## 2019-05-08 LAB — PROTEIN / CREATININE RATIO, URINE
Creatinine, Urine: 108.4 mg/dL
Protein, Ur: 4.5 mg/dL
Protein/Creat Ratio: 42 mg/g creat (ref 0–200)

## 2019-05-08 LAB — MICROSCOPIC EXAMINATION
Bacteria, UA: NONE SEEN
Casts: NONE SEEN /lpf
Epithelial Cells (non renal): NONE SEEN /hpf (ref 0–10)
RBC, Urine: NONE SEEN /hpf (ref 0–2)

## 2019-05-09 DIAGNOSIS — H47013 Ischemic optic neuropathy, bilateral: Secondary | ICD-10-CM | POA: Diagnosis not present

## 2019-05-09 LAB — STATUS REPORT

## 2019-05-09 LAB — INTACT PTH (INCLUDES CALCIUM)

## 2019-05-10 LAB — CBC WITH DIFFERENTIAL/PLATELET
Basophils Absolute: 0.1 10*3/uL (ref 0.0–0.2)
Basos: 1 %
EOS (ABSOLUTE): 1.1 10*3/uL — ABNORMAL HIGH (ref 0.0–0.4)
Eos: 13 %
Hematocrit: 41.7 % (ref 37.5–51.0)
Hemoglobin: 14.1 g/dL (ref 13.0–17.7)
Immature Grans (Abs): 0 10*3/uL (ref 0.0–0.1)
Immature Granulocytes: 1 %
Lymphocytes Absolute: 2.1 10*3/uL (ref 0.7–3.1)
Lymphs: 26 %
MCH: 30.8 pg (ref 26.6–33.0)
MCHC: 33.8 g/dL (ref 31.5–35.7)
MCV: 91 fL (ref 79–97)
Monocytes Absolute: 0.7 10*3/uL (ref 0.1–0.9)
Monocytes: 9 %
Neutrophils Absolute: 4.1 10*3/uL (ref 1.4–7.0)
Neutrophils: 50 %
Platelets: 121 10*3/uL — ABNORMAL LOW (ref 150–450)
RBC: 4.58 x10E6/uL (ref 4.14–5.80)
RDW: 13.2 % (ref 11.6–15.4)
WBC: 8.1 10*3/uL (ref 3.4–10.8)

## 2019-05-10 LAB — CMP14+EGFR
ALT: 14 IU/L (ref 0–44)
AST: 19 IU/L (ref 0–40)
Albumin/Globulin Ratio: 2.4 — ABNORMAL HIGH (ref 1.2–2.2)
Albumin: 5.1 g/dL — ABNORMAL HIGH (ref 4.0–5.0)
Alkaline Phosphatase: 93 IU/L (ref 39–117)
BUN/Creatinine Ratio: 12 (ref 9–20)
BUN: 26 mg/dL — ABNORMAL HIGH (ref 6–24)
Bilirubin Total: 0.5 mg/dL (ref 0.0–1.2)
CO2: 25 mmol/L (ref 20–29)
Calcium: 9.4 mg/dL (ref 8.7–10.2)
Chloride: 102 mmol/L (ref 96–106)
Creatinine, Ser: 2.1 mg/dL — ABNORMAL HIGH (ref 0.76–1.27)
GFR calc Af Amer: 42 mL/min/{1.73_m2} — ABNORMAL LOW (ref >59)
GFR calc non Af Amer: 36 mL/min/{1.73_m2} — ABNORMAL LOW (ref >59)
Globulin, Total: 2.1 g/dL (ref 1.5–4.5)
Glucose: 114 mg/dL — ABNORMAL HIGH (ref 65–99)
Potassium: 4.9 mmol/L (ref 3.5–5.2)
Sodium: 141 mmol/L (ref 134–144)
Total Protein: 7.2 g/dL (ref 6.0–8.5)

## 2019-05-10 LAB — PHOSPHORUS: Phosphorus: 3.9 mg/dL (ref 2.8–4.1)

## 2019-05-25 ENCOUNTER — Other Ambulatory Visit: Payer: Self-pay | Admitting: Nurse Practitioner

## 2019-05-25 DIAGNOSIS — E785 Hyperlipidemia, unspecified: Secondary | ICD-10-CM

## 2019-06-13 ENCOUNTER — Other Ambulatory Visit: Payer: Self-pay

## 2019-06-13 ENCOUNTER — Encounter: Payer: Self-pay | Admitting: Family Medicine

## 2019-06-13 ENCOUNTER — Ambulatory Visit (INDEPENDENT_AMBULATORY_CARE_PROVIDER_SITE_OTHER): Payer: Medicare Other | Admitting: Family Medicine

## 2019-06-13 DIAGNOSIS — H6501 Acute serous otitis media, right ear: Secondary | ICD-10-CM

## 2019-06-13 MED ORDER — LEVOFLOXACIN 500 MG PO TABS: 500.0000 mg | ORAL_TABLET | Freq: Every day | ORAL | 0 refills | Status: DC

## 2019-06-13 NOTE — Progress Notes (Signed)
No chief complaint on file.  Stuffy nose. Right ear stopped up, running yellow serous drainage.  BP  130/68. T 97.7. No cough. Occ sneeze. Onset yesterday.  HPI   Patient presents today for Stuffy nose. Right ear stopped up, running yellow serous drainage.  BP  130/68. T 97.7. No cough. Occ sneeze. Onset yesterday.   PMH: Smoking status noted ROS: Per HPI  Objective: Exam deferred. Pt. Harboring due to COVID 19. Phone visit performed.  Assessment and plan:  1. Non-recurrent acute serous otitis media of right ear     Meds ordered this encounter  Medications  . levofloxacin (LEVAQUIN) 500 MG tablet    Sig: Take 1 tablet (500 mg total) by mouth daily. For 10 days    Dispense:  10 tablet    Refill:  0    Virtual Visit via telephone Note  I discussed the limitations, risks, security and privacy concerns of performing an evaluation and management service by telephone and the availability of in person appointments. I also discussed with the patient that there may be a patient responsible charge related to this service. The patient expressed understanding and agreed to proceed. Pt. Is at home. Dr. Livia Snellen is in his office.  Follow Up Instructions:   I discussed the assessment and treatment plan with the patient. The patient was provided an opportunity to ask questions and all were answered. The patient agreed with the plan and demonstrated an understanding of the instructions.   The patient was advised to call back or seek an in-person evaluation if the symptoms worsen or if the condition fails to improve as anticipated.   Total minutes including chart review and phone contact time: 12   Follow up as needed.  [name], MD

## 2019-06-17 ENCOUNTER — Other Ambulatory Visit: Payer: Self-pay | Admitting: Nurse Practitioner

## 2019-06-17 DIAGNOSIS — E785 Hyperlipidemia, unspecified: Secondary | ICD-10-CM

## 2019-07-10 ENCOUNTER — Other Ambulatory Visit: Payer: Self-pay | Admitting: Family Medicine

## 2019-07-10 DIAGNOSIS — E785 Hyperlipidemia, unspecified: Secondary | ICD-10-CM

## 2019-07-11 NOTE — Telephone Encounter (Signed)
Left message to call back to schedule an appt.

## 2019-07-11 NOTE — Telephone Encounter (Signed)
MMM NTBS 30 days give 06/17/19

## 2019-07-13 ENCOUNTER — Other Ambulatory Visit: Payer: Self-pay | Admitting: Family Medicine

## 2019-07-13 DIAGNOSIS — E785 Hyperlipidemia, unspecified: Secondary | ICD-10-CM

## 2019-07-15 ENCOUNTER — Telehealth: Payer: Self-pay | Admitting: Nurse Practitioner

## 2019-07-15 DIAGNOSIS — E785 Hyperlipidemia, unspecified: Secondary | ICD-10-CM

## 2019-07-15 MED ORDER — LOVASTATIN 20 MG PO TABS: 20.0000 mg | ORAL_TABLET | Freq: Every day | ORAL | 0 refills | Status: DC

## 2019-07-15 NOTE — Telephone Encounter (Signed)
Appt made and 30 day supply of lovastatin sent to pharmacy.  Mother aware

## 2019-07-24 ENCOUNTER — Other Ambulatory Visit: Payer: Self-pay

## 2019-07-25 ENCOUNTER — Encounter: Payer: Self-pay | Admitting: Nurse Practitioner

## 2019-07-25 ENCOUNTER — Ambulatory Visit (INDEPENDENT_AMBULATORY_CARE_PROVIDER_SITE_OTHER): Payer: Medicare Other | Admitting: Nurse Practitioner

## 2019-07-25 VITALS — BP 113/77 | HR 90 | Temp 98.4°F | Ht 63.0 in | Wt 171.0 lb

## 2019-07-25 DIAGNOSIS — E559 Vitamin D deficiency, unspecified: Secondary | ICD-10-CM

## 2019-07-25 DIAGNOSIS — G801 Spastic diplegic cerebral palsy: Secondary | ICD-10-CM

## 2019-07-25 DIAGNOSIS — I471 Supraventricular tachycardia, unspecified: Secondary | ICD-10-CM

## 2019-07-25 DIAGNOSIS — I10 Essential (primary) hypertension: Secondary | ICD-10-CM

## 2019-07-25 DIAGNOSIS — N189 Chronic kidney disease, unspecified: Secondary | ICD-10-CM

## 2019-07-25 DIAGNOSIS — E663 Overweight: Secondary | ICD-10-CM

## 2019-07-25 DIAGNOSIS — K432 Incisional hernia without obstruction or gangrene: Secondary | ICD-10-CM

## 2019-07-25 DIAGNOSIS — K219 Gastro-esophageal reflux disease without esophagitis: Secondary | ICD-10-CM

## 2019-07-25 DIAGNOSIS — E785 Hyperlipidemia, unspecified: Secondary | ICD-10-CM | POA: Diagnosis not present

## 2019-07-25 DIAGNOSIS — R739 Hyperglycemia, unspecified: Secondary | ICD-10-CM | POA: Diagnosis not present

## 2019-07-25 LAB — BAYER DCA HB A1C WAIVED: HB A1C (BAYER DCA - WAIVED): 6.4 % (ref <7.0)

## 2019-07-25 MED ORDER — LOVASTATIN 20 MG PO TABS: 20.0000 mg | ORAL_TABLET | Freq: Every day | ORAL | 0 refills | Status: DC

## 2019-07-25 MED ORDER — FAMOTIDINE 10 MG PO TABS: 10.0000 mg | ORAL_TABLET | Freq: Two times a day (BID) | ORAL | 1 refills | Status: DC

## 2019-07-25 MED ORDER — LOVASTATIN 20 MG PO TABS: 20.0000 mg | ORAL_TABLET | Freq: Every day | ORAL | 1 refills | Status: DC

## 2019-07-25 MED ORDER — OLMESARTAN MEDOXOMIL 40 MG PO TABS: 40.0000 mg | ORAL_TABLET | Freq: Every day | ORAL | 1 refills | Status: DC

## 2019-07-25 NOTE — Patient Instructions (Signed)
Carbohydrate Counting for Diabetes Mellitus, Adult  Carbohydrate counting is a method of keeping track of how many carbohydrates you eat. Eating carbohydrates naturally increases the amount of sugar (glucose) in the blood. Counting how many carbohydrates you eat helps keep your blood glucose within normal limits, which helps you manage your diabetes (diabetes mellitus). It is important to know how many carbohydrates you can safely have in each meal. This is different for every person. A diet and nutrition specialist (registered dietitian) can help you make a meal plan and calculate how many carbohydrates you should have at each meal and snack. Carbohydrates are found in the following foods:  Grains, such as breads and cereals.  Dried beans and soy products.  Starchy vegetables, such as potatoes, peas, and corn.  Fruit and fruit juices.  Milk and yogurt.  Sweets and snack foods, such as cake, cookies, candy, chips, and soft drinks. How do I count carbohydrates? There are two ways to count carbohydrates in food. You can use either of the methods or a combination of both. Reading "Nutrition Facts" on packaged food The "Nutrition Facts" list is included on the labels of almost all packaged foods and beverages in the [place] It includes:  The serving size.  Information about nutrients in each serving, including the grams (g) of carbohydrate per serving. To use the "Nutrition Facts":  Decide how many servings you will have.  Multiply the number of servings by the number of carbohydrates per serving.  The resulting number is the total amount of carbohydrates that you will be having. Learning standard serving sizes of other foods When you eat carbohydrate foods that are not packaged or do not include "Nutrition Facts" on the label, you need to measure the servings in order to count the amount of carbohydrates:  Measure the foods that you will eat with a food scale or measuring cup, if needed.   Decide how many standard-size servings you will eat.  Multiply the number of servings by 15. Most carbohydrate-rich foods have about 15 g of carbohydrates per serving. ? For example, if you eat 8 oz (170 g) of strawberries, you will have eaten 2 servings and 30 g of carbohydrates (2 servings x 15 g = 30 g).  For foods that have more than one food mixed, such as soups and casseroles, you must count the carbohydrates in each food that is included. The following list contains standard serving sizes of common carbohydrate-rich foods. Each of these servings has about 15 g of carbohydrates:   hamburger bun or  English muffin.   oz (15 mL) syrup.   oz (14 g) jelly.  1 slice of bread.  1 six-inch tortilla.  3 oz (85 g) cooked rice or pasta.  4 oz (113 g) cooked dried beans.  4 oz (113 g) starchy vegetable, such as peas, corn, or potatoes.  4 oz (113 g) hot cereal.  4 oz (113 g) mashed potatoes or  of a large baked potato.  4 oz (113 g) canned or frozen fruit.  4 oz (120 mL) fruit juice.  4-6 crackers.  6 chicken nuggets.  6 oz (170 g) unsweetened dry cereal.  6 oz (170 g) plain fat-free yogurt or yogurt sweetened with artificial sweeteners.  8 oz (240 mL) milk.  8 oz (170 g) fresh fruit or one small piece of fruit.  24 oz (680 g) popped popcorn. Example of carbohydrate counting Sample meal  3 oz (85 g) chicken breast.  6 oz (170 g)   brown rice.  4 oz (113 g) corn.  8 oz (240 mL) milk.  8 oz (170 g) strawberries with sugar-free whipped topping. Carbohydrate calculation 1. Identify the foods that contain carbohydrates: ? Rice. ? Corn. ? Milk. ? Strawberries. 2. Calculate how many servings you have of each food: ? 2 servings rice. ? 1 serving corn. ? 1 serving milk. ? 1 serving strawberries. 3. Multiply each number of servings by 15 g: ? 2 servings rice x 15 g = 30 g. ? 1 serving corn x 15 g = 15 g. ? 1 serving milk x 15 g = 15 g. ? 1 serving  strawberries x 15 g = 15 g. 4. Add together all of the amounts to find the total grams of carbohydrates eaten: ? 30 g + 15 g + 15 g + 15 g = 75 g of carbohydrates total. Summary  Carbohydrate counting is a method of keeping track of how many carbohydrates you eat.  Eating carbohydrates naturally increases the amount of sugar (glucose) in the blood.  Counting how many carbohydrates you eat helps keep your blood glucose within normal limits, which helps you manage your diabetes.  A diet and nutrition specialist (registered dietitian) can help you make a meal plan and calculate how many carbohydrates you should have at each meal and snack. This information is not intended to replace advice given to you by your health care provider. Make sure you discuss any questions you have with your health care provider. Document Released: 11/21/2005 Document Revised: 06/15/2017 Document Reviewed: 05/04/2016 Elsevier Patient Education  2020 Elsevier Inc.  

## 2019-07-25 NOTE — Progress Notes (Signed)
Subjective:    Patient ID: Bradley Graham, male    DOB: 22-Oct-1971, 48 y.o.   MRN: [id number]   Chief Complaint: Medical Management of Chronic Issues    HPI:  1. Essential hypertension, benign No c/o chest pain, sob or headaches. Does not have blood pressure checked at home. BP Readings from Last 3 Encounters:  02/14/19 119/77  02/07/19 114/64  12/31/18 104/69     2. Supraventricular tachycardia ([place]) Mom states that he has not c/o heart racing or feeling palptations.  3. Spastic cerebral palsy, congenital (HCC) Has not had any recent falls. And gait is steady  4. Chronic kidney disease, unspecified CKD stage Lab Results  Component Value Date   CREATININE 2.10 (H) 05/07/2019  had telehealth visit with nephrology on 05/06/19. According to note no changes were made to plan of care.   5. Hyperlipidemia, unspecified hyperlipidemia type [place] fat diet. He has a very hearty appetite  6. Incisional hernia, without obstruction or gangrene Mom says he has not complained f any pain   7. Overweight (BMI 25.0-29.9) No recent weight changes  8. Vitamin D deficiency Takes a daily vitamin d supplement    Outpatient Encounter Medications as of 07/25/2019  Medication Sig  . aspirin 81 MG tablet Take 81 mg by mouth daily.  . baclofen (LIORESAL) 10 MG tablet Take 1 tablet (10 mg total) by mouth 3 (three) times daily.  Marland Kitchen docusate sodium (COLACE) 100 MG capsule Take 100 mg by mouth daily.  . famotidine (PEPCID) 10 MG tablet Take 10 mg by mouth 2 (two) times daily.  . fluticasone (FLONASE) 50 MCG/ACT nasal spray Place 1 spray into both nostrils 2 (two) times daily.  Marland Kitchen levofloxacin (LEVAQUIN) 500 MG tablet Take 1 tablet (500 mg total) by mouth daily. For 10 days  . loratadine (CLARITIN) 10 MG tablet Take 10 mg by mouth daily.  Marland Kitchen lovastatin (MEVACOR) 20 MG tablet Take 1 tablet (20 mg total) by mouth at bedtime. Needs to be seen  . olmesartan (BENICAR) 40 MG tablet Take 1 tablet (40 mg  total) by mouth daily.     Past Surgical History:  Procedure Laterality Date  . ABDOMINAL HERNIA REPAIR     [facility]   . ADENOIDECTOMY    . MOUTH SURGERY    . scoliosis    . VSD REPAIR    . WRIST SURGERY Left     Family History  Problem Relation Age of Onset  . Hyperlipidemia Mother   . Hyperthyroidism Mother   . Diabetes insipidus Mother   . Diabetes Father   . Hyperlipidemia Father   . Hypertension Father   . Hyperthyroidism Sister   . Cancer Maternal Grandfather 48       breast cancer  . Pernicious anemia Maternal Grandfather   . Cancer Cousin        colon cance    New complaints: None today  Social history: Lives with his parents  Controlled substance contract: n/a     Review of Systems  Constitutional: Negative for activity change and appetite change.  HENT: Negative.   Eyes: Negative for pain.  Respiratory: Negative for shortness of breath.   Cardiovascular: Negative for chest pain, palpitations and leg swelling.  Gastrointestinal: Negative for abdominal pain.  Endocrine: Negative for polydipsia.  Genitourinary: Negative.   Skin: Negative for rash.  Neurological: Negative for dizziness, weakness and headaches.  Hematological: Does not bruise/bleed easily.  Psychiatric/Behavioral: Negative.   All other systems reviewed and are negative.  Objective:   Physical Exam Vitals signs and nursing note reviewed.  Constitutional:      Appearance: Normal appearance. He is well-developed.  HENT:     Head: Normocephalic.     Nose: Nose normal.  Eyes:     Pupils: Pupils are equal, round, and reactive to light.  Neck:     Musculoskeletal: Normal range of motion and neck supple.     Thyroid: No thyroid mass or thyromegaly.     Vascular: No carotid bruit or JVD.     Trachea: Phonation normal.  Cardiovascular:     Rate and Rhythm: Normal rate and regular rhythm.  Pulmonary:     Effort: Pulmonary effort is normal. No respiratory distress.     Breath  sounds: Normal breath sounds.  Abdominal:     General: Bowel sounds are normal.     Palpations: Abdomen is soft.     Tenderness: There is no abdominal tenderness.  Musculoskeletal: Normal range of motion.  Lymphadenopathy:     Cervical: No cervical adenopathy.  Skin:    General: Skin is warm and dry.  Neurological:     Mental Status: He is alert and oriented to person, place, and time.  Psychiatric:        Behavior: Behavior normal.        Thought Content: Thought content normal.        Judgment: Judgment normal.     BP 113/77   Pulse 90   Temp 98.4 F (36.9 C) (Oral)   Ht 5\' 3"  (1.6 m)   Wt 171 lb (77.6 kg)   BMI 30.29 kg/m        Assessment & Plan:  Bradley Graham comes in today with chief complaint of Medical Management of Chronic Issues   Diagnosis and orders addressed:  1. Essential hypertension, benign Low sodium diet - olmesartan (BENICAR) 40 MG tablet; Take 1 tablet (40 mg total) by mouth daily.  Dispense: 90 tablet; Refill: 1  2. Supraventricular tachycardia (HCC) Avoid caffeine  3. Spastic cerebral palsy, congenital ([place]) Fall precautions  4. Chronic kidney disease, unspecified CKD stage Keep follow up with nephrology  5. Hyperlipidemia, unspecified hyperlipidemia type Low fat diet - lovastatin (MEVACOR) 20 MG tablet; Take 1 tablet (20 mg total) by mouth at bedtime. Needs to be seen  Dispense: 90 tablet; Refill: 1  6. Incisional hernia, without obstruction or gangrene  7. Overweight (BMI 25.0-29.9) Discussed diet and exercise for person with BMI >25 Will recheck weight in 3-6 months  8. Vitamin D deficiency Continue daily vitamin d supplement  9. Elevated blood sugar Discussed low carb diet We will continue to keep a check on HGBA1c - Bayer DCA Hb A1c Waived  10. Gastroesophageal reflux disease without esophagitis Avoid spicy foods Do not eat 2 hours prior to bedtime' - famotidine (PEPCID) 10 MG tablet; Take 1 tablet (10 mg total) by  mouth 2 (two) times daily.  Dispense: 90 tablet; Refill: 1   Labs pending Health Maintenance reviewed Diet and exercise encouraged  Follow up plan: 6 months   Mary-Margaret Hassell Done, FNP

## 2019-08-02 ENCOUNTER — Telehealth: Payer: Self-pay | Admitting: Nurse Practitioner

## 2019-08-02 MED ORDER — OLMESARTAN MEDOXOMIL 20 MG PO TABS: 20.0000 mg | ORAL_TABLET | Freq: Every day | ORAL | 1 refills | Status: DC

## 2019-08-02 NOTE — Telephone Encounter (Signed)
Corrected benicar rx

## 2019-08-08 DIAGNOSIS — H6123 Impacted cerumen, bilateral: Secondary | ICD-10-CM | POA: Diagnosis not present

## 2019-08-08 DIAGNOSIS — H903 Sensorineural hearing loss, bilateral: Secondary | ICD-10-CM | POA: Diagnosis not present

## 2019-08-08 DIAGNOSIS — H61303 Acquired stenosis of external ear canal, unspecified, bilateral: Secondary | ICD-10-CM | POA: Diagnosis not present

## 2019-09-19 DIAGNOSIS — Z79899 Other long term (current) drug therapy: Secondary | ICD-10-CM | POA: Diagnosis not present

## 2019-09-19 DIAGNOSIS — Z87448 Personal history of other diseases of urinary system: Secondary | ICD-10-CM | POA: Diagnosis not present

## 2019-09-19 DIAGNOSIS — I129 Hypertensive chronic kidney disease with stage 1 through stage 4 chronic kidney disease, or unspecified chronic kidney disease: Secondary | ICD-10-CM | POA: Diagnosis not present

## 2019-09-19 DIAGNOSIS — Z8719 Personal history of other diseases of the digestive system: Secondary | ICD-10-CM | POA: Diagnosis not present

## 2019-09-19 DIAGNOSIS — N1832 Chronic kidney disease, stage 3b: Secondary | ICD-10-CM | POA: Diagnosis not present

## 2019-09-19 DIAGNOSIS — I272 Pulmonary hypertension, unspecified: Secondary | ICD-10-CM | POA: Diagnosis not present

## 2019-09-19 DIAGNOSIS — Z8739 Personal history of other diseases of the musculoskeletal system and connective tissue: Secondary | ICD-10-CM | POA: Diagnosis not present

## 2019-09-19 DIAGNOSIS — Z8709 Personal history of other diseases of the respiratory system: Secondary | ICD-10-CM | POA: Diagnosis not present

## 2019-09-19 DIAGNOSIS — Q213 Tetralogy of Fallot: Secondary | ICD-10-CM | POA: Diagnosis not present

## 2019-09-19 DIAGNOSIS — N183 Chronic kidney disease, stage 3 unspecified: Secondary | ICD-10-CM | POA: Diagnosis not present

## 2019-09-19 DIAGNOSIS — D631 Anemia in chronic kidney disease: Secondary | ICD-10-CM | POA: Diagnosis not present

## 2019-09-19 DIAGNOSIS — E785 Hyperlipidemia, unspecified: Secondary | ICD-10-CM | POA: Diagnosis not present

## 2019-09-19 DIAGNOSIS — Z8679 Personal history of other diseases of the circulatory system: Secondary | ICD-10-CM | POA: Diagnosis not present

## 2019-09-19 DIAGNOSIS — M109 Gout, unspecified: Secondary | ICD-10-CM | POA: Diagnosis not present

## 2019-09-19 DIAGNOSIS — Z23 Encounter for immunization: Secondary | ICD-10-CM | POA: Diagnosis not present

## 2019-09-19 DIAGNOSIS — Z9889 Other specified postprocedural states: Secondary | ICD-10-CM | POA: Diagnosis not present

## 2019-11-19 ENCOUNTER — Other Ambulatory Visit: Payer: Self-pay | Admitting: Nurse Practitioner

## 2019-11-19 DIAGNOSIS — E785 Hyperlipidemia, unspecified: Secondary | ICD-10-CM

## 2019-11-19 DIAGNOSIS — I10 Essential (primary) hypertension: Secondary | ICD-10-CM

## 2019-11-22 ENCOUNTER — Telehealth: Payer: Self-pay | Admitting: Family Medicine

## 2019-11-22 DIAGNOSIS — E785 Hyperlipidemia, unspecified: Secondary | ICD-10-CM

## 2019-11-22 MED ORDER — OLMESARTAN MEDOXOMIL 20 MG PO TABS: 20.0000 mg | ORAL_TABLET | Freq: Every day | ORAL | 1 refills | Status: DC

## 2019-11-22 MED ORDER — LOVASTATIN 20 MG PO TABS: 20.0000 mg | ORAL_TABLET | Freq: Every day | ORAL | 1 refills | Status: DC

## 2019-11-22 NOTE — Telephone Encounter (Signed)
Per last office visit note patient is not to return until 6 months. Went ahead and refilled meds. Mother notified and verbalized understanding

## 2019-12-12 ENCOUNTER — Encounter: Payer: Self-pay | Admitting: Family Medicine

## 2019-12-12 ENCOUNTER — Ambulatory Visit (INDEPENDENT_AMBULATORY_CARE_PROVIDER_SITE_OTHER): Payer: Medicare Other | Admitting: Family Medicine

## 2019-12-12 DIAGNOSIS — H66009 Acute suppurative otitis media without spontaneous rupture of ear drum, unspecified ear: Secondary | ICD-10-CM | POA: Diagnosis not present

## 2019-12-12 DIAGNOSIS — J0100 Acute maxillary sinusitis, unspecified: Secondary | ICD-10-CM

## 2019-12-12 MED ORDER — CEFPROZIL 500 MG PO TABS: 500.0000 mg | ORAL_TABLET | Freq: Two times a day (BID) | ORAL | 0 refills | Status: DC

## 2019-12-12 NOTE — Progress Notes (Signed)
    Subjective:    Patient ID: Bradley Graham, male    DOB: 02/02/1971, 49 y.o.   MRN: [id number]   HPI: Bradley Graham is a 49 y.o. male presenting for Symptoms include congestion, nasal congestion, occasional non productive cough, post nasal drip . There is no fever, chills, or sweats. Onset of symptoms was a few days ago, gradually worsening since that time. Tends to recur occasionally. Right sided earache. Feels stuffy.    Depression screen [facility] 2/9 07/25/2019 02/07/2019 12/31/2018 12/11/2018 11/16/2018  Decreased Interest 0 0 0 0 0  Down, Depressed, Hopeless 0 0 0 0 0  PHQ - 2 Score 0 0 0 0 0     Relevant past medical, surgical, family and social history reviewed and updated as indicated.  Interim medical history since our last visit reviewed. Allergies and medications reviewed and updated.  ROS:  Review of Systems  Constitutional: Negative for fever.  HENT: Positive for congestion, ear pain and postnasal drip. Negative for hearing loss.   Respiratory: Negative for shortness of breath.   Cardiovascular: Negative for chest pain.  Gastrointestinal: Negative for nausea.     Social History   Tobacco Use  Smoking Status Never Smoker  Smokeless Tobacco Never Used       Objective:     Wt Readings from Last 3 Encounters:  07/25/19 171 lb (77.6 kg)  02/14/19 168 lb (76.2 kg)  02/07/19 167 lb 3.2 oz (75.8 kg)     Exam deferred. Pt. Harboring due to COVID 19. Phone visit performed.   Assessment & Plan:   1. Acute maxillary sinusitis, recurrence not specified   2. Acute suppurative otitis media without spontaneous rupture of ear drum, recurrence not specified, unspecified laterality     Meds ordered this encounter  Medications  . cefPROZIL (CEFZIL) 500 MG tablet    Sig: Take 1 tablet (500 mg total) by mouth 2 (two) times daily. For infection. Take all of this medication.    Dispense:  20 tablet    Refill:  0    No orders of the defined types were placed in this  encounter.     Diagnoses and all orders for this visit:  Acute maxillary sinusitis, recurrence not specified  Acute suppurative otitis media without spontaneous rupture of ear drum, recurrence not specified, unspecified laterality  Other orders -     cefPROZIL (CEFZIL) 500 MG tablet; Take 1 tablet (500 mg total) by mouth 2 (two) times daily. For infection. Take all of this medication.    Virtual Visit via telephone Note  I discussed the limitations, risks, security and privacy concerns of performing an evaluation and management service by telephone and the availability of in person appointments. The patient was identified with two identifiers. Pt.expressed understanding and agreed to proceed. Pt. Is at home. Dr. Livia Snellen is in his office.  Follow Up Instructions:   I discussed the assessment and treatment plan with the patient. The patient was provided an opportunity to ask questions and all were answered. The patient agreed with the plan and demonstrated an understanding of the instructions.   The patient was advised to call back or seek an in-person evaluation if the symptoms worsen or if the condition fails to improve as anticipated.   Total minutes including chart review and phone contact time: 9   Follow up plan: Return if symptoms worsen or fail to improve.  [name], MD [place]

## 2020-01-24 ENCOUNTER — Other Ambulatory Visit: Payer: Self-pay

## 2020-01-27 ENCOUNTER — Other Ambulatory Visit: Payer: Self-pay

## 2020-01-27 ENCOUNTER — Ambulatory Visit: Payer: Self-pay | Admitting: Nurse Practitioner

## 2020-01-27 ENCOUNTER — Ambulatory Visit (INDEPENDENT_AMBULATORY_CARE_PROVIDER_SITE_OTHER): Payer: Medicare Other | Admitting: Family Medicine

## 2020-01-27 ENCOUNTER — Encounter: Payer: Self-pay | Admitting: Family Medicine

## 2020-01-27 VITALS — BP 129/76 | HR 90 | Temp 98.6°F | Ht 63.0 in | Wt 177.2 lb

## 2020-01-27 DIAGNOSIS — K219 Gastro-esophageal reflux disease without esophagitis: Secondary | ICD-10-CM | POA: Diagnosis not present

## 2020-01-27 DIAGNOSIS — N189 Chronic kidney disease, unspecified: Secondary | ICD-10-CM | POA: Diagnosis not present

## 2020-01-27 DIAGNOSIS — E785 Hyperlipidemia, unspecified: Secondary | ICD-10-CM | POA: Diagnosis not present

## 2020-01-27 DIAGNOSIS — Z8774 Personal history of (corrected) congenital malformations of heart and circulatory system: Secondary | ICD-10-CM

## 2020-01-27 DIAGNOSIS — G801 Spastic diplegic cerebral palsy: Secondary | ICD-10-CM

## 2020-01-27 DIAGNOSIS — I10 Essential (primary) hypertension: Secondary | ICD-10-CM

## 2020-01-27 LAB — URINALYSIS
Bilirubin, UA: NEGATIVE
Glucose, UA: NEGATIVE
Ketones, UA: NEGATIVE
Leukocytes,UA: NEGATIVE
Nitrite, UA: NEGATIVE
Protein,UA: NEGATIVE
RBC, UA: NEGATIVE
Specific Gravity, UA: 1.025 (ref 1.005–1.030)
Urobilinogen, Ur: 0.2 mg/dL (ref 0.2–1.0)
pH, UA: 5.5 (ref 5.0–7.5)

## 2020-01-27 MED ORDER — LOVASTATIN 20 MG PO TABS: 20.0000 mg | ORAL_TABLET | Freq: Every day | ORAL | 1 refills | Status: DC

## 2020-01-27 MED ORDER — OLMESARTAN MEDOXOMIL 20 MG PO TABS: 20.0000 mg | ORAL_TABLET | Freq: Every day | ORAL | 1 refills | Status: DC

## 2020-01-27 MED ORDER — FAMOTIDINE 10 MG PO TABS: 10.0000 mg | ORAL_TABLET | Freq: Two times a day (BID) | ORAL | 1 refills | Status: DC

## 2020-01-27 NOTE — Progress Notes (Signed)
Subjective:  Patient ID: Bradley Graham, male    DOB: 1970-12-21  Age: 49 y.o. MRN: [id number]  CC: Follow-up   HPI Bradley Graham presents for follow-up of his chronic kidney condition.  He does see a nephrologist as well.  He has been stable and denies edema.  Patient in for follow-up of GERD. Currently asymptomatic taking Pepcid AC twice daily. There is no chest pain or heartburn. No hematemesis and no melena. No dysphagia or choking. Onset is remote. Progression is stable. Complicating factors, none.   Follow-up of hypertension. Patient has no history of headache chest pain or shortness of breath or recent cough. Patient also denies symptoms of TIA such as numbness weakness lateralizing. Patient checks  blood pressure at home and has not had any elevated readings recently. Patient denies side effects from his medication. States taking it regularly.  Patient in for follow-up of elevated cholesterol. Doing well without complaints on current medication. Denies side effects of statin including myalgia and arthralgia and nausea. Also in today for liver function testing. Currently no chest pain, shortness of breath or other cardiovascular related symptoms noted.  Patient has history of tetralogy of Fallot.  He may need a valve replacement at some point.  That is not being considered at this time.   Depression screen [facility] 2/9 01/27/2020 07/25/2019 02/07/2019  Decreased Interest 0 0 0  Down, Depressed, Hopeless 0 0 0  PHQ - 2 Score 0 0 0    History Bradley Graham has a past medical history of Atrial fibrillation ([employer]), CHF (congestive heart failure) ([place]), CKD (chronic kidney disease), Club foot of both feet, Hypertension, Left ventricular hypertrophy, Right ventricular hypertrophy, Speech impediment, Tetralogy of Fallot, and Vitamin D deficiency.   He has a past surgical history that includes VSD repair; scoliosis; Adenoidectomy; Mouth surgery; Wrist surgery (Left); and Abdominal hernia repair.   His  family history includes Cancer in his cousin; Cancer (age of onset: 98) in his maternal grandfather; Diabetes in his father; Diabetes insipidus in his mother; Hyperlipidemia in his father and mother; Hypertension in his father; Hyperthyroidism in his mother and sister; Pernicious anemia in his maternal grandfather.He reports that he has never smoked. He has never used smokeless tobacco. He reports that he does not drink alcohol or use drugs.    ROS Review of Systems  Constitutional: Negative for fever.  Respiratory: Negative for shortness of breath.   Cardiovascular: Negative for chest pain.  Musculoskeletal: Negative for arthralgias.  Skin: Negative for rash.    Objective:  BP 129/76   Pulse 90   Temp 98.6 F (37 C) (Temporal)   Ht '5\' 3"'  (1.6 m)   Wt 177 lb 3.2 oz (80.4 kg)   BMI 31.39 kg/m   BP Readings from Last 3 Encounters:  01/27/20 129/76  07/25/19 113/77  02/14/19 119/77    Wt Readings from Last 3 Encounters:  01/27/20 177 lb 3.2 oz (80.4 kg)  07/25/19 171 lb (77.6 kg)  02/14/19 168 lb (76.2 kg)     Physical Exam Vitals reviewed.  Constitutional:      Appearance: He is well-developed.  HENT:     Head: Normocephalic and atraumatic.     Right Ear: Tympanic membrane and external ear normal. No decreased hearing noted.     Left Ear: Tympanic membrane and external ear normal. No decreased hearing noted.     Mouth/Throat:     Pharynx: No oropharyngeal exudate or posterior oropharyngeal erythema.  Eyes:     Pupils:  Pupils are equal, round, and reactive to light.  Cardiovascular:     Rate and Rhythm: Normal rate and regular rhythm.     Heart sounds: No murmur.  Pulmonary:     Effort: No respiratory distress.     Breath sounds: Normal breath sounds.  Abdominal:     General: Bowel sounds are normal.     Palpations: Abdomen is soft. There is no mass.     Tenderness: There is no abdominal tenderness.  Musculoskeletal:     Cervical back: Normal range of motion  and neck supple.       Assessment & Plan:   Bradley Graham was seen today for follow-up.  Diagnoses and all orders for this visit:  Chronic kidney disease, unspecified CKD stage -     olmesartan (BENICAR) 20 MG tablet; Take 1 tablet (20 mg total) by mouth daily. -     CBC with Differential/Platelet -     CMP14+EGFR -     Urinalysis  Hyperlipidemia, unspecified hyperlipidemia type -     lovastatin (MEVACOR) 20 MG tablet; Take 1 tablet (20 mg total) by mouth at bedtime. Needs to be seen -     CBC with Differential/Platelet -     CMP14+EGFR -     Lipid panel  Gastroesophageal reflux disease without esophagitis -     famotidine (PEPCID) 10 MG tablet; Take 1 tablet (10 mg total) by mouth 2 (two) times daily. -     CBC with Differential/Platelet -     CMP14+EGFR  S/P TOF (tetralogy of Fallot) repair -     CBC with Differential/Platelet -     CMP14+EGFR  Essential hypertension, benign  Spastic cerebral palsy, congenital (HCC)   Patient is seen along with his mother who is his caregiver.  In spite of his multiple challenges seems to be thriving at this point.  Medications are as noted above and refilled for 6 months.  He is followed by nephrology and cardiology.  Followed here primarily for cholesterol blood pressure reflux and coordination of his medical care.  His cerebral palsy is limiting for him.    I have discontinued Bradley Graham's cefPROZIL. I am also having him maintain his docusate sodium, loratadine, aspirin, fluticasone, olmesartan, lovastatin, and famotidine.  Allergies as of 01/27/2020      Reactions   Aleve [naproxen Sodium]    Bactrim [sulfamethoxazole-trimethoprim]    Erythromycin    Heparin    Induced thrombopenia   Nsaids    Penicillins    Cortisporin [bacitra-neomycin-polymyxin-hc] Other (See Comments)   Burned ear canal per mom      Medication List       Accurate as of January 27, 2020  9:07 PM. If you have any questions, ask your nurse or doctor.          STOP taking these medications   cefPROZIL 500 MG tablet Commonly known as: CEFZIL Stopped by: [name], MD     TAKE these medications   aspirin 81 MG tablet Take 81 mg by mouth daily.   docusate sodium 100 MG capsule Commonly known as: COLACE Take 100 mg by mouth daily.   famotidine 10 MG tablet Commonly known as: PEPCID Take 1 tablet (10 mg total) by mouth 2 (two) times daily.   fluticasone 50 MCG/ACT nasal spray Commonly known as: FLONASE Place 1 spray into both nostrils 2 (two) times daily.   loratadine 10 MG tablet Commonly known as: CLARITIN Take 10 mg by mouth daily.  lovastatin 20 MG tablet Commonly known as: MEVACOR Take 1 tablet (20 mg total) by mouth at bedtime. Needs to be seen   olmesartan 20 MG tablet Commonly known as: Benicar Take 1 tablet (20 mg total) by mouth daily.        Follow-up: Return in about 6 months (around 07/26/2020) for [facility] physical.  [name], M.D.

## 2020-01-28 LAB — CMP14+EGFR
ALT: 16 IU/L (ref 0–44)
AST: 20 IU/L (ref 0–40)
Albumin/Globulin Ratio: 2.3 — ABNORMAL HIGH (ref 1.2–2.2)
Albumin: 4.5 g/dL (ref 4.0–5.0)
Alkaline Phosphatase: 109 IU/L (ref 39–117)
BUN/Creatinine Ratio: 12 (ref 9–20)
BUN: 22 mg/dL (ref 6–24)
Bilirubin Total: 0.4 mg/dL (ref 0.0–1.2)
CO2: 21 mmol/L (ref 20–29)
Calcium: 8.8 mg/dL (ref 8.7–10.2)
Chloride: 105 mmol/L (ref 96–106)
Creatinine, Ser: 1.85 mg/dL — ABNORMAL HIGH (ref 0.76–1.27)
GFR calc Af Amer: 49 mL/min/{1.73_m2} — ABNORMAL LOW (ref >59)
GFR calc non Af Amer: 42 mL/min/{1.73_m2} — ABNORMAL LOW (ref >59)
Globulin, Total: 2 g/dL (ref 1.5–4.5)
Glucose: 117 mg/dL — ABNORMAL HIGH (ref 65–99)
Potassium: 4.6 mmol/L (ref 3.5–5.2)
Sodium: 142 mmol/L (ref 134–144)
Total Protein: 6.5 g/dL (ref 6.0–8.5)

## 2020-01-28 LAB — LIPID PANEL
Chol/HDL Ratio: 2.3 ratio (ref 0.0–5.0)
Cholesterol, Total: 148 mg/dL (ref 100–199)
HDL: 65 mg/dL (ref >39)
LDL Chol Calc (NIH): 69 mg/dL (ref 0–99)
Triglycerides: 74 mg/dL (ref 0–149)
VLDL Cholesterol Cal: 14 mg/dL (ref 5–40)

## 2020-01-28 LAB — CBC WITH DIFFERENTIAL/PLATELET
Basophils Absolute: 0.1 10*3/uL (ref 0.0–0.2)
Basos: 1 %
EOS (ABSOLUTE): 0.8 10*3/uL — ABNORMAL HIGH (ref 0.0–0.4)
Eos: 10 %
Hematocrit: 40.3 % (ref 37.5–51.0)
Hemoglobin: 13.7 g/dL (ref 13.0–17.7)
Immature Grans (Abs): 0.1 10*3/uL (ref 0.0–0.1)
Immature Granulocytes: 1 %
Lymphocytes Absolute: 1.6 10*3/uL (ref 0.7–3.1)
Lymphs: 20 %
MCH: 31.5 pg (ref 26.6–33.0)
MCHC: 34 g/dL (ref 31.5–35.7)
MCV: 93 fL (ref 79–97)
Monocytes Absolute: 0.8 10*3/uL (ref 0.1–0.9)
Monocytes: 10 %
Neutrophils Absolute: 4.9 10*3/uL (ref 1.4–7.0)
Neutrophils: 58 %
Platelets: 124 10*3/uL — ABNORMAL LOW (ref 150–450)
RBC: 4.35 x10E6/uL (ref 4.14–5.80)
RDW: 12.3 % (ref 11.6–15.4)
WBC: 8.1 10*3/uL (ref 3.4–10.8)

## 2020-02-06 DIAGNOSIS — H906 Mixed conductive and sensorineural hearing loss, bilateral: Secondary | ICD-10-CM | POA: Diagnosis not present

## 2020-02-06 DIAGNOSIS — H6123 Impacted cerumen, bilateral: Secondary | ICD-10-CM | POA: Diagnosis not present

## 2020-02-10 ENCOUNTER — Other Ambulatory Visit: Payer: Self-pay

## 2020-02-10 ENCOUNTER — Ambulatory Visit (INDEPENDENT_AMBULATORY_CARE_PROVIDER_SITE_OTHER): Payer: Medicare Other | Admitting: *Deleted

## 2020-02-10 DIAGNOSIS — Z0000 Encounter for general adult medical examination without abnormal findings: Secondary | ICD-10-CM | POA: Diagnosis not present

## 2020-02-10 NOTE — Progress Notes (Signed)
MEDICARE ANNUAL WELLNESS VISIT  02/10/2020  Telephone Visit Disclaimer This Medicare AWV was conducted by telephone due to national recommendations for restrictions regarding the COVID-19 Pandemic (e.g. social distancing).  I verified, using two identifiers, that I am speaking with Bradley Graham or their authorized healthcare agent. I discussed the limitations, risks, security, and privacy concerns of performing an evaluation and management service by telephone and the potential availability of an in-person appointment in the future. The patient expressed understanding and agreed to proceed.   Subjective:  Bradley Graham is a 49 y.o. male patient of Stacks, Cletus Gash, MD who had a Medicare Annual Wellness Visit today via telephone. Bradley Graham is single and lives with his parents. He has spastic cerebal palsy, chronic kidney disease, and congential heart defects. He reports that he is socially active and does interact with friends/family regularly. He is minimally physically active and enjoys hanging out and playing the mandolin.   Patient Care Team: [name], MD as PCP - General (Family Medicine) Wilfrid Lund, MD (Otolaryngology) Ophelia Shoulder, MD (Pulmonary Disease) Plonk, Vivien Presto, MD as Referring Physician (Otolaryngology) Mercie Eon, MD (Internal Medicine) Thurman Coyer, DO as Consulting Physician (Sports Medicine) Precious Gilding, MD as Referring Physician (Nephrology)  Advanced Directives 02/10/2020 02/07/2019 06/27/2016 05/06/2015 03/16/2015  Does Patient Have a Medical Advance Directive? Yes No;Yes No Yes No  Type of Advance Directive Healthcare Power of [place] in Chart? - No - copy requested - No - copy requested -  Would patient like information on creating a medical advance directive? - Yes (MAU/Ambulatory/Procedural Areas - Information given) - - No - patient  declined information    Hospital Utilization Over the Past 12 Months: # of hospitalizations or ER visits: 0 # of surgeries: 0  Review of Systems    Patient reports that his overall health is unchanged compared to last year.  Cardiac Risk Factors include: male gender;Other (see comment), Risk factor comments: Deformity of left foot, Heart Disease  Patient Reported Readings (BP, Pulse, CBG, Weight, etc) none  Pain Assessment Pain : No/denies pain     Current Medications & Allergies (verified) Allergies as of 02/10/2020      Reactions   Aleve [naproxen Sodium]    Bactrim [sulfamethoxazole-trimethoprim]    Erythromycin    Heparin    Induced thrombopenia   Nsaids    Penicillins    Cortisporin [bacitra-neomycin-polymyxin-hc] Other (See Comments)   Burned ear canal per mom      Medication List       Accurate as of February 10, 2020  8:52 AM. If you have any questions, ask your nurse or doctor.        aspirin 81 MG tablet Take 81 mg by mouth daily.   docusate sodium 100 MG capsule Commonly known as: COLACE Take 100 mg by mouth daily.   famotidine 10 MG tablet Commonly known as: PEPCID Take 1 tablet (10 mg total) by mouth 2 (two) times daily.   fluticasone 50 MCG/ACT nasal spray Commonly known as: FLONASE Place 1 spray into both nostrils 2 (two) times daily.   loratadine 10 MG tablet Commonly known as: CLARITIN Take 10 mg by mouth daily.   lovastatin 20 MG tablet Commonly known as: MEVACOR Take 1 tablet (20 mg total) by mouth at bedtime. Needs to be seen   olmesartan 20 MG tablet Commonly known as: Benicar  Take 1 tablet (20 mg total) by mouth daily.       History (reviewed): Past Medical History:  Diagnosis Date  . Atrial fibrillation ([place])   . CHF (congestive heart failure) ([place])   . CKD (chronic kidney disease)   . Club foot of both feet   . Hypertension   . Left ventricular hypertrophy   . Right ventricular hypertrophy   . Speech impediment   .  Tetralogy of Fallot    s/p waterson shunt  . Vitamin D deficiency    Past Surgical History:  Procedure Laterality Date  . ABDOMINAL HERNIA REPAIR     [facility]   . ADENOIDECTOMY    . MOUTH SURGERY    . scoliosis    . VSD REPAIR    . WRIST SURGERY Left    Family History  Problem Relation Age of Onset  . Hyperlipidemia Mother   . Hyperthyroidism Mother   . Diabetes insipidus Mother   . Diabetes Father   . Hyperlipidemia Father   . Hypertension Father   . Hyperthyroidism Sister   . Cancer Maternal Grandfather 52       breast cancer  . Pernicious anemia Maternal Grandfather   . Cancer Cousin        colon cance   Social History   Socioeconomic History  . Marital status: Single    Spouse name: Not on file  . Number of children: Not on file  . Years of education: Not on file  . Highest education level: 12th grade  Occupational History  . Not on file  Tobacco Use  . Smoking status: Never Smoker  . Smokeless tobacco: Never Used  Substance and Sexual Activity  . Alcohol use: No  . Drug use: No  . Sexual activity: Never  Other Topics Concern  . Not on file  Social History Narrative   Single, lives with parents.   Social Determinants of Health   Financial Resource Strain:   . Difficulty of Paying Living Expenses: Not on file  Food Insecurity:   . Worried About Charity fundraiser in the Last Year: Not on file  . Ran Out of Food in the Last Year: Not on file  Transportation Needs:   . Lack of Transportation (Medical): Not on file  . Lack of Transportation (Non-Medical): Not on file  Physical Activity:   . Days of Exercise per Week: Not on file  . Minutes of Exercise per Session: Not on file  Stress:   . Feeling of Stress : Not on file  Social Connections:   . Frequency of Communication with Friends and Family: Not on file  . Frequency of Social Gatherings with Friends and Family: Not on file  . Attends Religious Services: Not on file  . Active Member of Clubs or  Organizations: Not on file  . Attends Archivist Meetings: Not on file  . Marital Status: Not on file    Activities of Daily Living In your present state of health, do you have any difficulty performing the following activities: 02/10/2020  Hearing? N  Vision? N  Difficulty concentrating or making decisions? Y  Comment forget things sometimes  Walking or climbing stairs? Y  Comment difficulty sometimes with stairs  Dressing or bathing? N  Doing errands, shopping? Y  Comment i do not go to the doctor by myself  Preparing Food and eating ? N  Using the Toilet? N  In the past six months, have you accidently leaked urine?  N  Do you have problems with loss of bowel control? N  Managing your Medications? N  Managing your Finances? Y  Comment mother Estate manager/land agent or managing your Housekeeping? N  Some recent data might be hidden    Patient Education/ Literacy How often do you need to have someone help you when you read instructions, pamphlets, or other written materials from your doctor or pharmacy?: 1 - Never What is the last grade level you completed in school?: 12  Exercise Current Exercise Habits: Home exercise routine, Type of exercise: walking, Time (Minutes): 40, Frequency (Times/Week): 3, Weekly Exercise (Minutes/Week): 120, Intensity: Mild, Exercise limited by: None identified  Diet Patient reports consuming 3 meals a day and 1 snack(s) a day Patient reports that his primary diet is: Regular Patient reports that she does have regular access to food.   Depression Screen PHQ 2/9 Scores 02/10/2020 01/27/2020 07/25/2019 02/07/2019 12/31/2018 12/11/2018 11/16/2018  PHQ - 2 Score 0 0 0 0 0 0 0     Fall Risk Fall Risk  02/10/2020 01/27/2020 07/25/2019 10/11/2018 10/06/2017  Graham in the past year? 0 0 0 0 No  Number Graham in past yr: - 0 - - -  Injury with Fall? - 0 - - -  Comment - - - - -  Risk for fall due to : - No Fall Risks - - -     Objective:  Bradley Graham  seemed alert and oriented and he participated appropriately during our telephone visit.  Blood Pressure Weight BMI  BP Readings from Last 3 Encounters:  01/27/20 129/76  07/25/19 113/77  02/14/19 119/77   Wt Readings from Last 3 Encounters:  01/27/20 177 lb 3.2 oz (80.4 kg)  07/25/19 171 lb (77.6 kg)  02/14/19 168 lb (76.2 kg)   BMI Readings from Last 1 Encounters:  01/27/20 31.39 kg/m    *Unable to obtain current vital signs, weight, and BMI due to telephone visit type  Hearing/Vision  . Bradley Graham did not seem to have difficulty with hearing/understanding during the telephone conversation . Reports that he has not had a formal eye exam by an eye care professional within the past year . Reports that he has not had a formal hearing evaluation within the past year *Unable to fully assess hearing and vision during telephone visit type  Cognitive Function: 6CIT Screen 02/10/2020  What Year? 0 points  What month? 0 points  What time? 0 points  Count back from 20 0 points  Months in reverse 4 points  Repeat phrase 2 points  Total Score 6   (Normal:0-7, Significant for Dysfunction: >8)  Normal Cognitive Function Screening: yes   Immunization & Health Maintenance Record Immunization History  Administered Date(s) Administered  . Influenza Inj Mdck Quad Pf 09/19/2019  . Influenza,inj,Quad PF,6+ Mos 09/29/2016, 08/10/2017  . Influenza-Unspecified 10/20/2014, 10/08/2015, 11/12/2018  . Tdap 02/07/2019    Health Maintenance  Topic Date Due  . HIV Screening  04/13/1986  . TETANUS/TDAP  02/06/2029  . INFLUENZA VACCINE  Completed       Assessment  This is a routine wellness examination for Bradley Graham.  Health Maintenance: Due or Overdue Health Maintenance Due  Topic Date Due  . HIV Screening  04/13/1986    Bradley Graham does not need a referral for Community Assistance: Care Management:   no Social Work:    no Prescription Assistance:  no Nutrition/Diabetes  Education:  no   Plan:  Personalized Goals Goals Addressed  This Visit's Progress   . Patient Stated       02/10/2020 AWV Goal: Exercise for General Health   Patient will verbalize understanding of the benefits of increased physical activity:  Exercising regularly is important. It will improve your overall fitness, flexibility, and endurance.  Regular exercise also will improve your overall health. It can help you control your weight, reduce stress, and improve your bone density.  Over the next year, patient will increase physical activity as tolerated with a goal of at least 150 minutes of moderate physical activity per week.   You can tell that you are exercising at a moderate intensity if your heart starts beating faster and you start breathing faster but can still hold a conversation.  Moderate-intensity exercise ideas include:  Walking 1 mile (1.6 km) in about 15 minutes  Biking  Hiking  Golfing  Dancing  Water aerobics  Patient will verbalize understanding of everyday activities that increase physical activity by providing examples like the following: ? Yard work, such as: ? Pushing a Conservation officer, nature ? Raking and bagging leaves ? Washing your car ? Pushing a stroller ? Shoveling snow ? Gardening ? Washing windows or floors  Patient will be able to explain general safety guidelines for exercising:   Before you start a new exercise program, talk with your health care provider.  Do not exercise so much that you hurt yourself, feel dizzy, or get very short of breath.  Wear comfortable clothes and wear shoes with good support.  Drink plenty of water while you exercise to prevent dehydration or heat stroke.  Work out until your breathing and your heartbeat get faster.       Personalized Health Maintenance & Screening Recommendations    Lung Cancer Screening Recommended: no (Low Dose CT Chest recommended if Age 58-80 years, 30 pack-year currently  smoking OR have quit w/in past 15 years) Hepatitis C Screening recommended: no HIV Screening recommended: yes  Advanced Directives: Written information was not prepared per patient's request.  Referrals & Orders No orders of the defined types were placed in this encounter.   Follow-up Plan . Follow-up with [name], MD as planned   I have personally reviewed and noted the following in the patient's chart:   . Medical and social history . Use of alcohol, tobacco or illicit drugs  . Current medications and supplements . Functional ability and status . Nutritional status . Physical activity . Advanced directives . List of other physicians . Hospitalizations, surgeries, and ER visits in previous 12 months . Vitals . Screenings to include cognitive, depression, and Graham . Referrals and appointments  In addition, I have reviewed and discussed with Bradley Graham certain preventive protocols, quality metrics, and best practice recommendations. A written personalized care plan for preventive services as well as general preventive health recommendations is available and can be mailed to the patient at his request.      Bradley Lamy, LPN  08/10/7892

## 2020-03-17 ENCOUNTER — Telehealth: Payer: Self-pay | Admitting: Family Medicine

## 2020-03-17 NOTE — Chronic Care Management (AMB) (Signed)
  Chronic Care Management   Outreach Note  03/17/2020 Name: Bradley Graham MRN: [id number] DOB: 06-Jun-1971  Bradley Graham is a 49 y.o. year old male who is a primary care patient of Stacks, Cletus Gash, MD. I reached out to [name] by phone today in response to a referral sent by Bradley Graham's health plan.     An unsuccessful telephone outreach was attempted today. The patient was referred to the case management team for assistance with care management and care coordination.   Follow Up Plan: A HIPPA compliant phone message was left for the patient providing contact information and requesting a return call.  The care management team will reach out to the patient again over the next 7 days. If patient returns call to provider office, please advise to call [place] at [phone].  [place], [place] [address] Direct Dial: [phone] [email or web address] Website: [email or web address]

## 2020-03-20 NOTE — Chronic Care Management (AMB) (Signed)
  Chronic Care Management   Note  03/20/2020 Name: Bradley Graham MRN: [id number] DOB: [birth year]  Bradley Graham is a 49 y.o. year old male who is a primary care patient of Stacks, Cletus Gash, MD. I reached out to Bradley Graham by phone today in response to a referral sent by [name]'s health plan.     Bradley Graham was given information about Chronic Care Management services today including:  1. CCM service includes personalized support from designated clinical staff supervised by his physician, including individualized plan of care and coordination with other care providers 2. 24/7 contact phone numbers for assistance for urgent and routine care needs. 3. Service will only be billed when office clinical staff spend 20 minutes or more in a month to coordinate care. 4. Only one practitioner may furnish and bill the service in a calendar month. 5. The patient may stop CCM services at any time (effective at the end of the month) by phone call to the office staff. 6. The patient will be responsible for cost sharing (co-pay) of up to 20% of the service fee (after annual deductible is met).  Mother Bradley Graham verbally agreed to assistance and services provided by embedded care coordination/care management team today.  Follow up plan: Telephone appointment with care management team member scheduled for: 04/21/2020.  [place], [place] [address] Direct Dial: [phone] [email or web address] Website: [email or web address]

## 2020-04-02 DIAGNOSIS — Z9889 Other specified postprocedural states: Secondary | ICD-10-CM | POA: Diagnosis not present

## 2020-04-02 DIAGNOSIS — I129 Hypertensive chronic kidney disease with stage 1 through stage 4 chronic kidney disease, or unspecified chronic kidney disease: Secondary | ICD-10-CM | POA: Diagnosis not present

## 2020-04-02 DIAGNOSIS — Z930 Tracheostomy status: Secondary | ICD-10-CM | POA: Diagnosis not present

## 2020-04-02 DIAGNOSIS — I272 Pulmonary hypertension, unspecified: Secondary | ICD-10-CM | POA: Diagnosis not present

## 2020-04-02 DIAGNOSIS — I351 Nonrheumatic aortic (valve) insufficiency: Secondary | ICD-10-CM | POA: Diagnosis not present

## 2020-04-02 DIAGNOSIS — E785 Hyperlipidemia, unspecified: Secondary | ICD-10-CM | POA: Diagnosis not present

## 2020-04-02 DIAGNOSIS — Z79899 Other long term (current) drug therapy: Secondary | ICD-10-CM | POA: Diagnosis not present

## 2020-04-02 DIAGNOSIS — Q213 Tetralogy of Fallot: Secondary | ICD-10-CM | POA: Diagnosis not present

## 2020-04-02 DIAGNOSIS — I130 Hypertensive heart and chronic kidney disease with heart failure and stage 1 through stage 4 chronic kidney disease, or unspecified chronic kidney disease: Secondary | ICD-10-CM | POA: Diagnosis not present

## 2020-04-02 DIAGNOSIS — N183 Chronic kidney disease, stage 3 unspecified: Secondary | ICD-10-CM | POA: Diagnosis not present

## 2020-04-02 DIAGNOSIS — I517 Cardiomegaly: Secondary | ICD-10-CM | POA: Diagnosis not present

## 2020-04-02 DIAGNOSIS — M109 Gout, unspecified: Secondary | ICD-10-CM | POA: Diagnosis not present

## 2020-04-02 DIAGNOSIS — I371 Nonrheumatic pulmonary valve insufficiency: Secondary | ICD-10-CM | POA: Diagnosis not present

## 2020-04-02 DIAGNOSIS — N1832 Chronic kidney disease, stage 3b: Secondary | ICD-10-CM | POA: Diagnosis not present

## 2020-04-02 DIAGNOSIS — D631 Anemia in chronic kidney disease: Secondary | ICD-10-CM | POA: Diagnosis not present

## 2020-04-02 DIAGNOSIS — N179 Acute kidney failure, unspecified: Secondary | ICD-10-CM | POA: Diagnosis not present

## 2020-04-04 DIAGNOSIS — Z20822 Contact with and (suspected) exposure to covid-19: Secondary | ICD-10-CM | POA: Diagnosis not present

## 2020-04-04 DIAGNOSIS — Z0131 Encounter for examination of blood pressure with abnormal findings: Secondary | ICD-10-CM | POA: Diagnosis not present

## 2020-04-21 ENCOUNTER — Ambulatory Visit (INDEPENDENT_AMBULATORY_CARE_PROVIDER_SITE_OTHER): Payer: Medicare Other | Admitting: Licensed Clinical Social Worker

## 2020-04-21 DIAGNOSIS — E559 Vitamin D deficiency, unspecified: Secondary | ICD-10-CM

## 2020-04-21 DIAGNOSIS — I10 Essential (primary) hypertension: Secondary | ICD-10-CM

## 2020-04-21 DIAGNOSIS — E785 Hyperlipidemia, unspecified: Secondary | ICD-10-CM

## 2020-04-21 DIAGNOSIS — G801 Spastic diplegic cerebral palsy: Secondary | ICD-10-CM

## 2020-04-21 NOTE — Patient Instructions (Signed)
Licensed Clinical Social Worker Visit Information  Goals we discussed today:  Goals Addressed            This Visit's Progress   . Client will talk with LCSW in next 30 days about client interests in finding a job (pt-stated)       CARE PLAN ENTRY  Current Barriers:  . Spastic Cerebral Palsy in client with Chronic diagnoses of Atrial Flutter, CKD, Vitamin D deficiency, HTN, HLD,  . Speech challenges . Mobility challenges  Clinical Social Work Clinical Goal(s):  Marland Kitchen LCSW will call client/Connie in next 30 days to talk about client  job interests  Interventions: . Talked with [name] ,mother of client, about CCM program . Talked with [name] about client's upcoming medical appointments . Talked with [name] about client history of heart surgery (2005) . Talked with [name] about client's past work history (received help through [employer]) . Talked with [name] about client transportation She helps him with transport needs . Talked with [name] about client appetite and snacks . Talked with [name] about social support network for client  . Talked with [name] about client relaxation techniques (plays mandolin, guitar) . Talked with [name] about RNCM ssupport with CCM program . Talked with [name] about hearing needs of client  Patient Self Care Activities:   Uses exercise bike several times weekly Eats meals with set up assistance  Patient Self Care Deficits:  . Speech challenges . Spastic Cerebral Palsy  Initial goal documentation       Materials Provided: No  Follow Up Plan: LCSW to call client/Connie Polakowski, mother, in next 4 weeks to talk with client or mother about job interests of client  The patient/Connie Durocher, mother of client, verbalized understanding of instructions provided today and declined a print copy of patient instruction materials.   [name].Hobart Marte MSW, LCSW Licensed Clinical Social Worker [facility] Care  Management [phone]

## 2020-04-21 NOTE — Chronic Care Management (AMB) (Signed)
Chronic Care Management    Clinical Social Work Follow Up Note  04/21/2020 Name: Bradley Graham MRN: [id number] DOB: [birth year]  Bradley Graham is a 49 y.o. year old male who is a primary care patient of Stacks, Cletus Gash, MD. The CCM team was consulted for assistance with [employer] .   Review of patient status, including review of consultants reports, other relevant assessments, and collaboration with appropriate care team members and the patient's provider was performed as part of comprehensive patient evaluation and provision of chronic care management services.    SDOH (Social Determinants of Health) assessments performed: Yes; risk of depression ;risk of tobacco use  SDOH Interventions     Most Recent Value  SDOH Interventions  Depression Interventions/Treatment   -- [talked with [name] about LCSW support and RNCM support]        Chronic Care Management from 04/21/2020 in [place]  PHQ-9 Total Score  6     GAD 7 : Generalized Anxiety Score 04/21/2020  Nervous, Anxious, on Edge 0  Control/stop worrying 0  Worry too much - different things 0  Trouble relaxing 0  Restless 0  Easily annoyed or irritable 0  Afraid - awful might happen 0  Total GAD 7 Score 0  Anxiety Difficulty Not difficult at all    Outpatient Encounter Medications as of 04/21/2020  Medication Sig Note  . aspirin 81 MG tablet Take 81 mg by mouth daily.   Marland Kitchen docusate sodium (COLACE) 100 MG capsule Take 100 mg by mouth daily. 07/19/2017: PRN  . famotidine (PEPCID) 10 MG tablet Take 1 tablet (10 mg total) by mouth 2 (two) times daily.   . fluticasone (FLONASE) 50 MCG/ACT nasal spray Place 1 spray into both nostrils 2 (two) times daily. 02/23/2015: Received from: External Pharmacy  . loratadine (CLARITIN) 10 MG tablet Take 10 mg by mouth daily.   Marland Kitchen lovastatin (MEVACOR) 20 MG tablet Take 1 tablet (20 mg total) by mouth at bedtime. Needs to be seen   . olmesartan (BENICAR) 20 MG  tablet Take 1 tablet (20 mg total) by mouth daily.    No facility-administered encounter medications on file as of 04/21/2020.    Goals Addressed            This Visit's Progress   . Client will talk with LCSW in next 30 days about client interests in finding a job (pt-stated)       CARE PLAN ENTRY  Current Barriers:  . Spastic Cerebral Palsy in client with Chronic diagnoses of Atrial Flutter, CKD, Vitamin D deficiency, HTN, HLD,  . Speech challenges . Mobility challenges  Clinical Social Work Clinical Goal(s):  Marland Kitchen LCSW will call client/Connie in next 30 days to talk about client  job interests  Interventions: . Talked with [name] ,mother of client, about CCM program . Talked with [name] about client's upcoming medical appointments . Talked with [name] about client history of heart surgery (2005) . Talked with [name] about client's past work history (received help through [employer]) . Talked with [name] about client transportation She helps him with transport needs . Talked with [name] about client appetite and snacks . Talked with [name] about social support network for client  . Talked with [name] about client relaxation techniques (plays mandolin, guitar) . Talked with [name] about RNCM ssupport with CCM program . Talked with [name] about hearing needs of client . Completed PHQ-2/9; completed GAD-7   Patient Self Care Activities:  Uses exercise bike several times weekly Eats meals with set up assistance  Patient Self Care Deficits:  . Speech challenges . Spastic Cerebral Palsy  Initial goal documentation       Follow Up Plan: LCSW to call client/Connie Fleury in next 4 weeks to talk with client or [name] about client job interests.  [name].Adelaida Reindel MSW, LCSW Licensed Clinical Social Worker [facility] [phone]

## 2020-05-01 ENCOUNTER — Other Ambulatory Visit: Payer: Self-pay

## 2020-05-01 ENCOUNTER — Encounter: Payer: Self-pay | Admitting: Family Medicine

## 2020-05-01 ENCOUNTER — Ambulatory Visit (INDEPENDENT_AMBULATORY_CARE_PROVIDER_SITE_OTHER): Payer: Medicare Other | Admitting: Family Medicine

## 2020-05-01 DIAGNOSIS — M109 Gout, unspecified: Secondary | ICD-10-CM

## 2020-05-01 MED ORDER — PREDNISONE 10 MG (21) PO TBPK: ORAL_TABLET | ORAL | 0 refills | Status: DC

## 2020-05-01 NOTE — Progress Notes (Signed)
Virtual Visit via Telephone Note  I [name] on 05/01/20 at 4:30 PM by telephone and verified that I am speaking with the correct person using two identifiers. Bradley Graham is currently located at home and his mother is currently with him during this visit. The provider, Loman Brooklyn, FNP is located in their office at time of visit.  I discussed the limitations, risks, security and privacy concerns of performing an evaluation and management service by telephone and the availability of in person appointments. I also discussed with the patient that there may be a patient responsible charge related to this service. The patient expressed understanding and agreed to proceed.  Subjective: PCP: Bradley Fraise, MD  Chief Complaint  Patient presents with  . Gout   Patient and his mother report he is having a gout flare in his left foot that started 2 days ago.  He is experiencing pain with mild swelling and erythema.  It is making ambulation difficult and he is currently using a walker because of it.  Patient with a history of gout.  Mother feels his last flare was over a year ago.  He is not on prevention for flares due to chronic kidney disease.   ROS: Per HPI  Current Outpatient Medications:  .  aspirin 81 MG tablet, Take 81 mg by mouth daily., Disp: , Rfl:  .  docusate sodium (COLACE) 100 MG capsule, Take 100 mg by mouth daily., Disp: , Rfl:  .  famotidine (PEPCID) 10 MG tablet, Take 1 tablet (10 mg total) by mouth 2 (two) times daily., Disp: 90 tablet, Rfl: 1 .  fluticasone (FLONASE) 50 MCG/ACT nasal spray, Place 1 spray into both nostrils 2 (two) times daily., Disp: , Rfl: 12 .  loratadine (CLARITIN) 10 MG tablet, Take 10 mg by mouth daily., Disp: , Rfl:  .  lovastatin (MEVACOR) 20 MG tablet, Take 1 tablet (20 mg total) by mouth at bedtime. Needs to be seen, Disp: 90 tablet, Rfl: 1 .  olmesartan (BENICAR) 20 MG tablet, Take 1 tablet (20 mg total) by mouth daily.,  Disp: 90 tablet, Rfl: 1  Allergies  Allergen Reactions  . Aleve [Naproxen Sodium]   . Bactrim [Sulfamethoxazole-Trimethoprim]   . Erythromycin   . Heparin     Induced thrombopenia  . Nsaids   . Penicillins   . Cortisporin [Bacitra-Neomycin-Polymyxin-Hc] Other (See Comments)    Burned ear canal per mom   Past Medical History:  Diagnosis Date  . Atrial fibrillation ([place])   . CHF (congestive heart failure) ([place])   . CKD (chronic kidney disease)   . Club foot of both feet   . Hypertension   . Left ventricular hypertrophy   . Right ventricular hypertrophy   . Speech impediment   . Tetralogy of Fallot    s/p waterson shunt  . Vitamin D deficiency     Observations/Objective: A&O  No respiratory distress or wheezing audible over the phone Mood, judgement, and thought processes all WNL   Assessment and Plan: 1. Acute gout of left foot, unspecified cause - predniSONE (STERAPRED UNI-PAK 21 TAB) 10 MG (21) TBPK tablet; As directed x 6 days  Dispense: 21 tablet; Refill: 0   Follow Up Instructions:  I discussed the assessment and treatment plan with the patient. The patient was provided an opportunity to ask questions and all were answered. The patient agreed with the plan and demonstrated an understanding of the instructions.   The patient was advised  to call back or seek an in-person evaluation if the symptoms worsen or if the condition fails to improve as anticipated.  The above assessment and management plan was discussed with the patient. The patient verbalized understanding of and has agreed to the management plan. Patient is aware to call the clinic if symptoms persist or worsen. Patient is aware when to return to the clinic for a follow-up visit. Patient educated on when it is appropriate to go to the emergency department.   Time call ended: 4:39 PM  I provided 11 minutes of non-face-to-face time during this encounter.  Hendricks Limes, MSN, APRN, FNP-C [facility] 05/01/20

## 2020-05-06 ENCOUNTER — Ambulatory Visit (INDEPENDENT_AMBULATORY_CARE_PROVIDER_SITE_OTHER): Payer: Medicare Other | Admitting: Licensed Clinical Social Worker

## 2020-05-06 DIAGNOSIS — E785 Hyperlipidemia, unspecified: Secondary | ICD-10-CM

## 2020-05-06 DIAGNOSIS — G801 Spastic diplegic cerebral palsy: Secondary | ICD-10-CM

## 2020-05-06 DIAGNOSIS — E559 Vitamin D deficiency, unspecified: Secondary | ICD-10-CM

## 2020-05-06 DIAGNOSIS — I10 Essential (primary) hypertension: Secondary | ICD-10-CM | POA: Diagnosis not present

## 2020-05-06 NOTE — Patient Instructions (Addendum)
Licensed Clinical Social Worker Visit Information  Goals we discussed today:  Goals    . Client will talk with LCSW in next 30 days about client interests in finding a job (pt-stated)     CARE PLAN ENTRY  Current Barriers:  . Spastic Cerebral Palsy in client with Chronic diagnoses of Atrial Flutter, CKD, Vitamin D deficiency, HTN, HLD,  . Speech challenges . Mobility challenges  Clinical Social Work Clinical Goal(s):  Marland Kitchen LCSW will call client/Bradley in next 30 days to talk about client  job interests  Interventions: Talked with Bradley Graham ,mother of client, about CCM program  Talked with Bradley Graham about client's upcoming medical appointments  Talked with Bradley Graham about client's past work history (received help through [employer])  Talked with Bradley Graham about client transportation She helps him with transport needs  Talked with Bradley Graham about client appetite and food intake  Talked with Bradley Graham about social support network for client  Talked with Bradley Graham about client relaxation techniques (plays mandolin, Chiropodist)  Talked with Bradley Graham about RNCM support with CCM program  Talked with Bradley Graham about hearing needs of client  Talked with Bradley Graham about ambulation challenges of client (using a cane to help him walk)  Talked with Bradley Graham about recent gout issue of client  Patient Self Care Activities:   Uses exercise bike several times weekly Eats meals with set up assistance  Patient Self Care Deficits:  . Speech challenges . Spastic Cerebral Palsy  Initial goal documentation    Materials Provided: No  Follow Up Plan: LCSW to call client/Bradley Graham in next 4 weeks to talk with client or Bradley Graham about client job interests.  The patient Bradley Graham, mother of client, verbalized understanding of instructions provided today and declined a print copy of patient instruction materials.   [name].Lorilei Horan MSW, LCSW Licensed Clinical Social Worker [facility]  Care Management [phone]

## 2020-05-06 NOTE — Chronic Care Management (AMB) (Signed)
Chronic Care Management    Clinical Social Work Follow Up Note  05/06/2020 Name: Bradley Graham MRN: [id number] DOB: [birth year]  Bradley Graham is a 49 y.o. year old male who is a primary care patient of Stacks, Cletus Gash, MD. The CCM team was consulted for assistance with [employer] .   Review of patient status, including review of consultants reports, other relevant assessments, and collaboration with appropriate care team members and the patient's provider was performed as part of comprehensive patient evaluation and provision of chronic care management services.    SDOH (Social Determinants of Health) assessments performed: Yes;risk for tobacco use; risk for depression    Chronic Care Management from 04/21/2020 in [place]  PHQ-9 Total Score  6      GAD 7 : Generalized Anxiety Score 04/21/2020  Nervous, Anxious, on Edge 0  Control/stop worrying 0  Worry too much - different things 0  Trouble relaxing 0  Restless 0  Easily annoyed or irritable 0  Afraid - awful might happen 0  Total GAD 7 Score 0  Anxiety Difficulty Not difficult at all    Outpatient Encounter Medications as of 05/06/2020  Medication Sig Note  . aspirin 81 MG tablet Take 81 mg by mouth daily.   Marland Kitchen docusate sodium (COLACE) 100 MG capsule Take 100 mg by mouth daily. 07/19/2017: PRN  . famotidine (PEPCID) 10 MG tablet Take 1 tablet (10 mg total) by mouth 2 (two) times daily.   . fluticasone (FLONASE) 50 MCG/ACT nasal spray Place 1 spray into both nostrils 2 (two) times daily. 02/23/2015: Received from: External Pharmacy  . loratadine (CLARITIN) 10 MG tablet Take 10 mg by mouth daily.   Marland Kitchen lovastatin (MEVACOR) 20 MG tablet Take 1 tablet (20 mg total) by mouth at bedtime. Needs to be seen   . olmesartan (BENICAR) 20 MG tablet Take 1 tablet (20 mg total) by mouth daily.   . predniSONE (STERAPRED UNI-PAK 21 TAB) 10 MG (21) TBPK tablet As directed x 6 days    No facility-administered encounter  medications on file as of 05/06/2020.    Goals    . Client will talk with LCSW in next 30 days about client interests in finding a job (pt-stated)     CARE PLAN ENTRY  Current Barriers:  . Spastic Cerebral Palsy in client with Chronic diagnoses of Atrial Flutter, CKD, Vitamin D deficiency, HTN, HLD,  . Speech challenges . Mobility challenges  Clinical Social Work Clinical Goal(s):  Marland Kitchen LCSW will call client/Bradley in next 30 days to talk about client  job interests  Interventions: . Talked with [name] ,mother of client, about CCM program . Talked with [name] about client's upcoming medical appointments . Talked with [name] about client's past work history (received help through [employer]) . Talked with [name] about client transportation She helps him with transport needs . Talked with [name] about client appetite and food intake . Talked with [name] about social support network for client  . Talked with [name] about client relaxation techniques (plays mandolin, guitar) . Talked with [name] about RNCM support with CCM program . Talked with [name] about hearing needs of client . Talked with [name] about ambulation challenges of client (using a cane to help him walk) . Talked with [name] about recent gout issue of client   Patient Self Care Activities:   Uses exercise bike several times weekly Eats meals with set up assistance  Patient Self Care Deficits:  . Speech  challenges . Spastic Cerebral Palsy  Initial goal documentation    Follow Up Plan: LCSW to call client/Bradley Graham in next 4 weeks to talk with client or [name] about client job interests.  [name].Sarea Fyfe MSW, LCSW Licensed Clinical Social Worker [facility] [phone]

## 2020-05-08 ENCOUNTER — Telehealth: Payer: Self-pay | Admitting: Family Medicine

## 2020-05-08 MED ORDER — PREDNISONE 10 MG (21) PO TBPK: ORAL_TABLET | ORAL | 0 refills | Status: DC

## 2020-05-08 NOTE — Telephone Encounter (Signed)
  Incoming Patient Call  05/08/2020  What symptoms do you have? Gout in foot  How long have you been sick? About a week ago   Have you been seen for this problem? No, was prescribed steroids it is a little better, but he is still limping and has pain in that foot it is better but not completely gone  If your provider decides to give you a prescription, which pharmacy would you like for it to be sent to? [facility]   Patient informed that this information will be sent to the clinical staff for review and that they should receive a follow up call.

## 2020-05-08 NOTE — Telephone Encounter (Signed)
Given prednisone on May 28. Please advise on continued pain.

## 2020-05-08 NOTE — Telephone Encounter (Signed)
Prednisone Prescription sent to pharmacy, if does not resolve will need to be seen.

## 2020-05-08 NOTE — Telephone Encounter (Signed)
Patient aware, script is ready. 

## 2020-05-19 ENCOUNTER — Ambulatory Visit (INDEPENDENT_AMBULATORY_CARE_PROVIDER_SITE_OTHER): Payer: Medicare Other

## 2020-05-19 ENCOUNTER — Encounter: Payer: Self-pay | Admitting: Nurse Practitioner

## 2020-05-19 ENCOUNTER — Other Ambulatory Visit: Payer: Self-pay

## 2020-05-19 ENCOUNTER — Ambulatory Visit (INDEPENDENT_AMBULATORY_CARE_PROVIDER_SITE_OTHER): Payer: Medicare Other | Admitting: Nurse Practitioner

## 2020-05-19 VITALS — BP 119/77 | HR 95 | Temp 98.4°F | Resp 20 | Ht 63.0 in | Wt 180.0 lb

## 2020-05-19 DIAGNOSIS — M25562 Pain in left knee: Secondary | ICD-10-CM

## 2020-05-19 NOTE — Patient Instructions (Signed)

## 2020-05-19 NOTE — Progress Notes (Signed)
   Subjective:    Patient ID: Bradley Graham, male    DOB: 1971-01-12, 49 y.o.   MRN: [id number]   Chief Complaint: Knee Pain (Left   [place] [address] 3 weeks ago) and ? gout left foot   HPI Patient brought in by his mom. They said he fell 3 weeks ago and landed on his left knee. 3 days after the fall he=is left foot started hurting and they dx him with gout. He was given steroids which helped some. Mom called in and git a second round of steroids and now the gout is better but knee is still hurting. Pain is worse when walking.. denies any swelling of knee.   Review of Systems     Objective:   Physical Exam Vitals and nursing note reviewed.  Constitutional:      Appearance: Normal appearance.  Cardiovascular:     Rate and Rhythm: Normal rate and regular rhythm.     Heart sounds: Normal heart sounds.  Pulmonary:     Breath sounds: Normal breath sounds.  Musculoskeletal:     Comments: No patella tenderness No left knee effusion FROM without pain All ligaments intact  Skin:    General: Skin is warm and dry.  Neurological:     General: No focal deficit present.     Mental Status: He is alert and oriented to person, place, and time.  Psychiatric:        Mood and Affect: Mood normal.        Behavior: Behavior normal.    BP 119/77   Pulse 95   Temp 98.4 F (36.9 C) (Temporal)   Resp 20   Ht 5\' 3"  (1.6 m)   Wt 180 lb (81.6 kg)   SpO2 97%   BMI 31.89 kg/m     Left knee xray- joint space narrowing-Preliminary reading by Ronnald Collum, FNP  [facility]     Assessment & Plan:  Bradley Graham in today with chief complaint of Knee Pain (Left   Fell 3 weeks ago) and ? gout left foot   1. Acute pain of left knee Wrap knee Use cane if helps Tylenol as needed RTO prn - DG Knee 1-2 Views Left - Ambulatory referral to Orthopedic Surgery    The above assessment and management plan was discussed with the patient. The patient verbalized understanding of and has agreed to the management plan.  Patient is aware to call the clinic if symptoms persist or worsen. Patient is aware when to return to the clinic for a follow-up visit. Patient educated on when it is appropriate to go to the emergency department.   Mary-Margaret Hassell Done, FNP

## 2020-05-20 ENCOUNTER — Ambulatory Visit
Admission: RE | Admit: 2020-05-20 | Discharge: 2020-05-20 | Disposition: A | Discharge status: Home / Self Care | Payer: Medicare Other | Source: Ambulatory Visit | Attending: Physician Assistant | Admitting: Physician Assistant

## 2020-05-20 ENCOUNTER — Other Ambulatory Visit: Payer: Self-pay | Admitting: Nurse Practitioner

## 2020-05-20 ENCOUNTER — Other Ambulatory Visit: Payer: Self-pay | Admitting: Physician Assistant

## 2020-05-20 DIAGNOSIS — S82142A Displaced bicondylar fracture of left tibia, initial encounter for closed fracture: Secondary | ICD-10-CM

## 2020-05-20 DIAGNOSIS — S82102A Unspecified fracture of upper end of left tibia, initial encounter for closed fracture: Secondary | ICD-10-CM

## 2020-05-20 DIAGNOSIS — M25462 Effusion, left knee: Secondary | ICD-10-CM | POA: Diagnosis not present

## 2020-06-09 ENCOUNTER — Telehealth: Payer: Self-pay

## 2020-07-10 ENCOUNTER — Ambulatory Visit (INDEPENDENT_AMBULATORY_CARE_PROVIDER_SITE_OTHER): Payer: Medicare Other | Admitting: Licensed Clinical Social Worker

## 2020-07-10 DIAGNOSIS — N189 Chronic kidney disease, unspecified: Secondary | ICD-10-CM | POA: Diagnosis not present

## 2020-07-10 DIAGNOSIS — E559 Vitamin D deficiency, unspecified: Secondary | ICD-10-CM

## 2020-07-10 DIAGNOSIS — E785 Hyperlipidemia, unspecified: Secondary | ICD-10-CM | POA: Diagnosis not present

## 2020-07-10 DIAGNOSIS — I10 Essential (primary) hypertension: Secondary | ICD-10-CM

## 2020-07-10 NOTE — Patient Instructions (Addendum)
Licensed Clinical Education officer, museum Visit Information  Goals we discussed today:      Client will talk with LCSW in next 30 days about client interests in finding a job (pt-stated)         CARE PLAN ENTRY  Current Barriers:   Spastic Cerebral Palsy in client with Chronic diagnoses of Atrial Flutter, CKD, Vitamin D deficiency, HTN, HLD,   Speech challenges  Mobility challenges  Clinical Social Work Clinical Goal(s):   LCSW will call client/Bradley in next 30 days to talk about client  job interests  Interventions:  Talked with [name] ,mother of client, about CCM program  Talked with Kijana Cromie about client's upcoming medical appointments  Talked with [name] about client history of heart surgery (2005)  Talked with [name] about client's past work history (received help through [employer])  Talked with [name] about client transportation She helps him with transport needs  Talked with [name] about client appetite and snacks  Talked with [name] about social support network for client   Talked with [name] about client relaxation techniques (plays mandolin, Chiropodist)  Talked with [name] about RNCM support with CCM program  Talked with [name] about hearing needs of client  Talked with [name] about client ambulation (she said he is walking better now)  Talked with [name] about kidney function of client (she said client had been on dialysis previously when he had been hospitalized)  Talked with [name] about client history of Gout  Patient Self Care Activities:   Uses exercise bike several times weekly Eats meals with set up assistance  Patient Self Care Deficits:   Speech challenges  Spastic Cerebral Palsy  Initial goal documentation           Follow Up Plan:  LCSW to call client/Bradley Seeney in next 4 weeks to talk with client or [name] about client job interests.   Materials Provided: No  The patient/Bradley Graham, mother of client,  verbalized understanding of instructions provided today and declined a print copy of patient instruction materials.   [name].Sheresa Cullop MSW, LCSW Licensed Clinical Social Worker [facility] [phone]

## 2020-07-10 NOTE — Chronic Care Management (AMB) (Signed)
Chronic Care Management    Clinical Social Work Follow Up Note  07/10/2020 Name: Bradley Graham MRN: [id number] DOB: [birth year]  Bradley Graham is a 49 y.o. year old male who is a primary care patient of Stacks, Cletus Gash, MD. The CCM team was consulted for assistance with [employer] .   Review of patient status, including review of consultants reports, other relevant assessments, and collaboration with appropriate care team members and the patient's provider was performed as part of comprehensive patient evaluation and provision of chronic care management services.    SDOH (Social Determinants of Health) assessments performed: No;risk for tobacco use; risk for depression; risk for physical inactivity; risk for stress    Chronic Care Management from 04/21/2020 in [place]  PHQ-9 Total Score 6       GAD 7 : Generalized Anxiety Score 04/21/2020  Nervous, Anxious, on Edge 0  Control/stop worrying 0  Worry too much - different things 0  Trouble relaxing 0  Restless 0  Easily annoyed or irritable 0  Afraid - awful might happen 0  Total GAD 7 Score 0  Anxiety Difficulty Not difficult at all    Outpatient Encounter Medications as of 07/10/2020  Medication Sig Note  . aspirin 81 MG tablet Take 81 mg by mouth daily.   Marland Kitchen docusate sodium (COLACE) 100 MG capsule Take 100 mg by mouth daily. 07/19/2017: PRN  . famotidine (PEPCID) 10 MG tablet Take 1 tablet (10 mg total) by mouth 2 (two) times daily.   . fluticasone (FLONASE) 50 MCG/ACT nasal spray Place 1 spray into both nostrils 2 (two) times daily. 02/23/2015: Received from: External Pharmacy  . loratadine (CLARITIN) 10 MG tablet Take 10 mg by mouth daily.   Marland Kitchen lovastatin (MEVACOR) 20 MG tablet Take 1 tablet (20 mg total) by mouth at bedtime. Needs to be seen   . olmesartan (BENICAR) 20 MG tablet Take 1 tablet (20 mg total) by mouth daily.    No facility-administered encounter medications on file as of 07/10/2020.      Goals    .  Client will talk with LCSW in next 30 days about client interests in finding a job (pt-stated)      CARE PLAN ENTRY  Current Barriers:  . Spastic Cerebral Palsy in client with Chronic diagnoses of Atrial Flutter, CKD, Vitamin D deficiency, HTN, HLD,  . Speech challenges . Mobility challenges  Clinical Social Work Clinical Goal(s):  Marland Kitchen LCSW will call client/Bradley in next 30 days to talk about client  job interests  Interventions: . Talked with Bradley Graham ,mother of client, about CCM program . Talked with Bradley Graham about client's upcoming medical appointments . Talked with Bradley Graham about client history of heart surgery (2005) . Talked with Bradley Graham about client's past work history (received help through [employer]) . Talked with Bradley Graham about client transportation She helps him with transport needs . Talked with Bradley Graham about client appetite and snacks . Talked with Bradley Graham about social support network for client  . Talked with Bradley Graham about client relaxation techniques (plays mandolin, guitar) . Talked with Bradley Graham about RNCM support with CCM program . Talked with Bradley Graham about hearing needs of client . Talked with Bradley Graham about client ambulation (she said he is walking better now) . Talked with Bradley Graham about kidney function of client (she said client had been on dialysis previously when he had been hospitalized) . Talked with Bradley Graham about client history of Gout  Patient Self Care Activities:  Uses exercise bike several times weekly Eats meals with set up assistance  Patient Self Care Deficits:  . Speech challenges . Spastic Cerebral Palsy  Initial goal documentation          Follow Up Plan:  LCSW to call client/Bradley Graham in next 4 weeks to talk with client or Bradley Graham about client job interests.   [name].Danial Hlavac MSW, LCSW Licensed Clinical Social Worker [facility] [phone]

## 2020-07-14 DIAGNOSIS — Z20822 Contact with and (suspected) exposure to covid-19: Secondary | ICD-10-CM | POA: Diagnosis not present

## 2020-07-27 ENCOUNTER — Encounter: Payer: Self-pay | Admitting: Family Medicine

## 2020-07-27 ENCOUNTER — Other Ambulatory Visit: Payer: Self-pay

## 2020-07-27 ENCOUNTER — Ambulatory Visit (INDEPENDENT_AMBULATORY_CARE_PROVIDER_SITE_OTHER): Payer: Medicare Other | Admitting: Family Medicine

## 2020-07-27 VITALS — BP 127/79 | HR 96 | Temp 97.6°F | Resp 20 | Ht 63.0 in | Wt 177.5 lb

## 2020-07-27 DIAGNOSIS — Z1159 Encounter for screening for other viral diseases: Secondary | ICD-10-CM

## 2020-07-27 DIAGNOSIS — E559 Vitamin D deficiency, unspecified: Secondary | ICD-10-CM

## 2020-07-27 DIAGNOSIS — Z114 Encounter for screening for human immunodeficiency virus [HIV]: Secondary | ICD-10-CM

## 2020-07-27 DIAGNOSIS — I4892 Unspecified atrial flutter: Secondary | ICD-10-CM | POA: Diagnosis not present

## 2020-07-27 DIAGNOSIS — E785 Hyperlipidemia, unspecified: Secondary | ICD-10-CM | POA: Diagnosis not present

## 2020-07-27 DIAGNOSIS — K219 Gastro-esophageal reflux disease without esophagitis: Secondary | ICD-10-CM

## 2020-07-27 DIAGNOSIS — N189 Chronic kidney disease, unspecified: Secondary | ICD-10-CM

## 2020-07-27 DIAGNOSIS — I471 Supraventricular tachycardia: Secondary | ICD-10-CM | POA: Diagnosis not present

## 2020-07-27 DIAGNOSIS — G801 Spastic diplegic cerebral palsy: Secondary | ICD-10-CM | POA: Diagnosis not present

## 2020-07-27 MED ORDER — LOVASTATIN 20 MG PO TABS: 20.0000 mg | ORAL_TABLET | Freq: Every day | ORAL | 1 refills | Status: DC

## 2020-07-27 MED ORDER — LORATADINE 10 MG PO TABS: 10.0000 mg | ORAL_TABLET | Freq: Every day | ORAL | 1 refills | Status: AC

## 2020-07-27 MED ORDER — FLUTICASONE PROPIONATE 50 MCG/ACT NA SUSP
1.0000 | Freq: Two times a day (BID) | NASAL | 1 refills | Status: DC
Start: 2020-07-27 — End: 2020-08-20

## 2020-07-27 MED ORDER — OLMESARTAN MEDOXOMIL 20 MG PO TABS: 20.0000 mg | ORAL_TABLET | Freq: Every day | ORAL | 1 refills | Status: DC

## 2020-07-27 MED ORDER — FAMOTIDINE 10 MG PO TABS: 10.0000 mg | ORAL_TABLET | Freq: Two times a day (BID) | ORAL | 3 refills | Status: AC

## 2020-07-27 NOTE — Progress Notes (Signed)
Subjective:  Patient ID: Bradley Graham, male    DOB: [birth year]  Age: 49 y.o. MRN: [id number]  CC: Medical Management of Chronic Issues   HPI Bradley Graham presents for routine follow-up. Due to the cerebral palsy and tetralogy of Fallot he lives with his parents who care for him. When they become unable to provide care there are plans to move him to his sister's home. Meanwhile he is doing quite well. His father has Covid but to Bradley Graham tested negative. He has chronic kidney disease but has not been swelling.  Follow-up of hypertension. Patient has no history of headache chest pain or shortness of breath or recent cough. Patient also denies symptoms of TIA such as numbness weakness lateralizing. Patient checks  blood pressure at home and has not had any elevated readings recently. Patient denies side effects from his medication. States taking it regularly. Patient in for follow-up of elevated cholesterol. Doing well without complaints on current medication. Denies side effects of statin including myalgia and arthralgia and nausea. Also in today for liver function testing. Currently no chest pain, shortness of breath or other cardiovascular related symptoms noted. Patient in for follow-up of GERD. Currently asymptomatic taking  PPI daily. There is no chest pain or heartburn. No hematemesis and no melena. No dysphagia or choking. Onset is remote. Progression is stable. Complicating factors, none.   Depression screen [facility] 2/9 07/27/2020 05/19/2020 04/21/2020  Decreased Interest 0 0 1  Down, Depressed, Hopeless 0 0 1  PHQ - 2 Score 0 0 2  Altered sleeping - - 1  Tired, decreased energy - - 1  Change in appetite - - 0  Feeling bad or failure about yourself  - - 0  Trouble concentrating - - 1  Moving slowly or fidgety/restless - - 1  Suicidal thoughts - - 0  PHQ-9 Score - - 6  Difficult doing work/chores - - Somewhat difficult    History Bradley Graham has a past medical history of Atrial fibrillation  ([place]), CHF (congestive heart failure) ([place]), CKD (chronic kidney disease), Club foot of both feet, Hypertension, Left ventricular hypertrophy, Right ventricular hypertrophy, Speech impediment, Tetralogy of Fallot, and Vitamin D deficiency.   He has a past surgical history that includes VSD repair; scoliosis; Adenoidectomy; Mouth surgery; Wrist surgery (Left); and Abdominal hernia repair.   His family history includes Cancer in his cousin; Cancer (age of onset: 42) in his maternal grandfather; Diabetes in his father; Diabetes insipidus in his mother; Hyperlipidemia in his father and mother; Hypertension in his father; Hyperthyroidism in his mother and sister; Pernicious anemia in his maternal grandfather.He reports that he has never smoked. He has never used smokeless tobacco. He reports that he does not drink alcohol and does not use drugs.    ROS Review of Systems  Constitutional: Negative for fever.  Respiratory: Negative for shortness of breath.   Cardiovascular: Negative for chest pain.  Musculoskeletal: Negative for arthralgias.  Skin: Negative for rash.    Objective:  BP 127/79   Pulse 96   Temp 97.6 F (36.4 C) (Temporal)   Resp 20   Ht '5\' 3"'  (1.6 m)   Wt 177 lb 8 oz (80.5 kg)   SpO2 92%   BMI 31.44 kg/m   BP Readings from Last 3 Encounters:  07/27/20 127/79  05/19/20 119/77  01/27/20 129/76    Wt Readings from Last 3 Encounters:  07/27/20 177 lb 8 oz (80.5 kg)  05/19/20 180 lb (81.6 kg)  01/27/20 177  lb 3.2 oz (80.4 kg)     Physical Exam Constitutional:      General: He is not in acute distress.    Appearance: He is well-developed.  HENT:     Head: Normocephalic and atraumatic.     Right Ear: External ear normal.     Left Ear: External ear normal.     Nose: Nose normal.  Eyes:     Conjunctiva/sclera: Conjunctivae normal.     Pupils: Pupils are equal, round, and reactive to light.  Cardiovascular:     Rate and Rhythm: Normal rate and regular rhythm.      Heart sounds: Normal heart sounds. No murmur heard.   Pulmonary:     Effort: Pulmonary effort is normal. No respiratory distress.     Breath sounds: Normal breath sounds. No wheezing or rales.  Abdominal:     Palpations: Abdomen is soft.     Tenderness: There is no abdominal tenderness.  Musculoskeletal:        General: Normal range of motion.     Cervical back: Normal range of motion and neck supple.  Skin:    General: Skin is warm and dry.  Neurological:     Mental Status: He is alert and oriented to person, place, and time.     Deep Tendon Reflexes: Reflexes are normal and symmetric.  Psychiatric:        Behavior: Behavior normal.       Assessment & Plan:   Bradley Graham was seen today for medical management of chronic issues.  Diagnoses and all orders for this visit:  Chronic kidney disease, unspecified CKD stage -     olmesartan (BENICAR) 20 MG tablet; Take 1 tablet (20 mg total) by mouth daily. -     CBC with Differential/Platelet -     CMP14+EGFR -     Uric acid  Hyperlipidemia, unspecified hyperlipidemia type -     lovastatin (MEVACOR) 20 MG tablet; Take 1 tablet (20 mg total) by mouth at bedtime. Needs to be seen -     CBC with Differential/Platelet -     CMP14+EGFR -     Lipid panel  Spastic cerebral palsy, congenital (HCC) -     CBC with Differential/Platelet -     CMP14+EGFR  Supraventricular tachycardia (HCC) -     CBC with Differential/Platelet -     CMP14+EGFR  Atrial flutter, unspecified type (HCC) -     CBC with Differential/Platelet -     CMP14+EGFR  Gastroesophageal reflux disease without esophagitis -     famotidine (PEPCID) 10 MG tablet; Take 1 tablet (10 mg total) by mouth 2 (two) times daily. -     CBC with Differential/Platelet -     CMP14+EGFR  Vitamin D deficiency -     VITAMIN D 25 Hydroxy (Vit-D Deficiency, Fractures)  Need for hepatitis C screening test -     Hepatitis C antibody  Encounter for screening for HIV -     HIV Antibody  (routine testing w rflx)  Other orders -     loratadine (CLARITIN) 10 MG tablet; Take 1 tablet (10 mg total) by mouth daily. -     fluticasone (FLONASE) 50 MCG/ACT nasal spray; Place 1 spray into both nostrils 2 (two) times daily.       I have changed Sierra L. Flansburg's loratadine. I am also having him maintain his docusate sodium, aspirin, Vitamin D, diphenhydrAMINE, olmesartan, lovastatin, fluticasone, and famotidine.  Allergies as of 07/27/2020  Reactions   Aleve [naproxen Sodium]    Bactrim [sulfamethoxazole-trimethoprim]    Erythromycin    Heparin    Induced thrombopenia   Nsaids    Penicillins    Cortisporin [bacitra-neomycin-polymyxin-hc] Other (See Comments)   Burned ear canal per mom      Medication List       Accurate as of July 27, 2020  6:18 PM. If you have any questions, ask your nurse or doctor.        aspirin 81 MG tablet Take 81 mg by mouth daily.   diphenhydrAMINE 25 mg capsule Commonly known as: BENADRYL Take 25 mg by mouth every 6 (six) hours as needed.   docusate sodium 100 MG capsule Commonly known as: COLACE Take 100 mg by mouth daily.   famotidine 10 MG tablet Commonly known as: PEPCID Take 1 tablet (10 mg total) by mouth 2 (two) times daily.   fluticasone 50 MCG/ACT nasal spray Commonly known as: FLONASE Place 1 spray into both nostrils 2 (two) times daily.   loratadine 10 MG tablet Commonly known as: CLARITIN Take 1 tablet (10 mg total) by mouth daily.   lovastatin 20 MG tablet Commonly known as: MEVACOR Take 1 tablet (20 mg total) by mouth at bedtime. Needs to be seen   olmesartan 20 MG tablet Commonly known as: Benicar Take 1 tablet (20 mg total) by mouth daily. What changed: Another medication with the same name was removed. Continue taking this medication, and follow the directions you see here. Changed by: [name], MD   Vitamin D 50 MCG (2000 UT) Caps Take by mouth.        Follow-up: Return in about 6  months (around 01/27/2021).  [name], M.D.

## 2020-07-28 ENCOUNTER — Other Ambulatory Visit: Payer: Self-pay | Admitting: Family Medicine

## 2020-07-28 LAB — CBC WITH DIFFERENTIAL/PLATELET
Basophils Absolute: 0.1 10*3/uL (ref 0.0–0.2)
Basos: 1 %
EOS (ABSOLUTE): 0.9 10*3/uL — ABNORMAL HIGH (ref 0.0–0.4)
Eos: 10 %
Hematocrit: 41.9 % (ref 37.5–51.0)
Hemoglobin: 13.9 g/dL (ref 13.0–17.7)
Immature Grans (Abs): 0.1 10*3/uL (ref 0.0–0.1)
Immature Granulocytes: 1 %
Lymphocytes Absolute: 1.7 10*3/uL (ref 0.7–3.1)
Lymphs: 18 %
MCH: 31.1 pg (ref 26.6–33.0)
MCHC: 33.2 g/dL (ref 31.5–35.7)
MCV: 94 fL (ref 79–97)
Monocytes Absolute: 0.7 10*3/uL (ref 0.1–0.9)
Monocytes: 7 %
Neutrophils Absolute: 5.8 10*3/uL (ref 1.4–7.0)
Neutrophils: 63 %
Platelets: 138 10*3/uL — ABNORMAL LOW (ref 150–450)
RBC: 4.47 x10E6/uL (ref 4.14–5.80)
RDW: 13 % (ref 11.6–15.4)
WBC: 9.2 10*3/uL (ref 3.4–10.8)

## 2020-07-28 LAB — LIPID PANEL
Chol/HDL Ratio: 3.1 ratio (ref 0.0–5.0)
Cholesterol, Total: 175 mg/dL (ref 100–199)
HDL: 56 mg/dL (ref >39)
LDL Chol Calc (NIH): 100 mg/dL — ABNORMAL HIGH (ref 0–99)
Triglycerides: 108 mg/dL (ref 0–149)
VLDL Cholesterol Cal: 19 mg/dL (ref 5–40)

## 2020-07-28 LAB — CMP14+EGFR
ALT: 20 IU/L (ref 0–44)
AST: 19 IU/L (ref 0–40)
Albumin/Globulin Ratio: 1.8 (ref 1.2–2.2)
Albumin: 4.7 g/dL (ref 4.0–5.0)
Alkaline Phosphatase: 117 IU/L (ref 48–121)
BUN/Creatinine Ratio: 14 (ref 9–20)
BUN: 27 mg/dL — ABNORMAL HIGH (ref 6–24)
Bilirubin Total: 0.4 mg/dL (ref 0.0–1.2)
CO2: 22 mmol/L (ref 20–29)
Calcium: 9.3 mg/dL (ref 8.7–10.2)
Chloride: 103 mmol/L (ref 96–106)
Creatinine, Ser: 1.89 mg/dL — ABNORMAL HIGH (ref 0.76–1.27)
GFR calc Af Amer: 47 mL/min/{1.73_m2} — ABNORMAL LOW (ref >59)
GFR calc non Af Amer: 41 mL/min/{1.73_m2} — ABNORMAL LOW (ref >59)
Globulin, Total: 2.6 g/dL (ref 1.5–4.5)
Glucose: 146 mg/dL — ABNORMAL HIGH (ref 65–99)
Potassium: 5.2 mmol/L (ref 3.5–5.2)
Sodium: 140 mmol/L (ref 134–144)
Total Protein: 7.3 g/dL (ref 6.0–8.5)

## 2020-07-28 LAB — HEPATITIS C ANTIBODY: Hep C Virus Ab: <0.1 s/co ratio (ref 0.0–0.9)

## 2020-07-28 LAB — VITAMIN D 25 HYDROXY (VIT D DEFICIENCY, FRACTURES): Vit D, 25-Hydroxy: 25.3 ng/mL — ABNORMAL LOW (ref 30.0–100.0)

## 2020-07-28 LAB — URIC ACID: Uric Acid: 8.5 mg/dL — ABNORMAL HIGH (ref 3.8–8.4)

## 2020-07-28 LAB — HIV ANTIBODY (ROUTINE TESTING W REFLEX): HIV Screen 4th Generation wRfx: NONREACTIVE

## 2020-07-28 MED ORDER — VITAMIN D (ERGOCALCIFEROL) 1.25 MG (50000 UNIT) PO CAPS: 50000.0000 [IU] | ORAL_CAPSULE | ORAL | 3 refills | Status: AC

## 2020-08-13 DIAGNOSIS — H6123 Impacted cerumen, bilateral: Secondary | ICD-10-CM | POA: Diagnosis not present

## 2020-08-13 DIAGNOSIS — H906 Mixed conductive and sensorineural hearing loss, bilateral: Secondary | ICD-10-CM | POA: Diagnosis not present

## 2020-08-13 DIAGNOSIS — H938X3 Other specified disorders of ear, bilateral: Secondary | ICD-10-CM | POA: Diagnosis not present

## 2020-08-16 IMAGING — CT CT KNEE*L* W/O CM
3 series · 16 of 33 positions shown, 19 images · non-contrast
Comparison: Left knee x-rays from yesterday.

CLINICAL DATA: Fell 2-3 weeks ago. Possible tibial plateau
fracture.

EXAM:
CT OF THE LEFT KNEE WITHOUT CONTRAST
TECHNIQUE: Multidetector CT imaging of the left knee was performed according to
the standard protocol. Multiplanar CT image reconstructions were
also generated.

[Series 4: soft tissue lower extremity · axial · 0.38mm/px · z∈[+392,+546]mm · 8 of 93 slices shown, 10 images]
[im 8/93  soft-tissue]
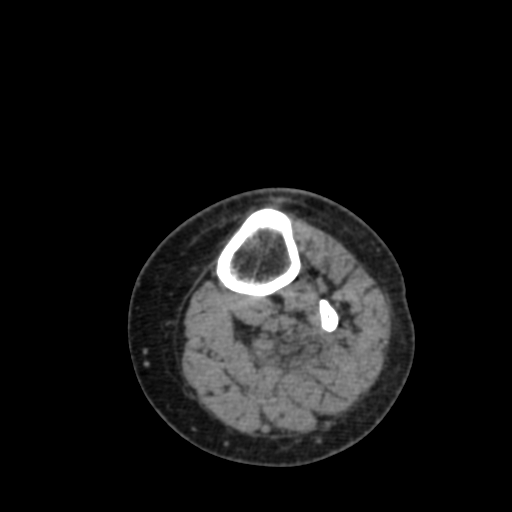
[im 8/93  bone]
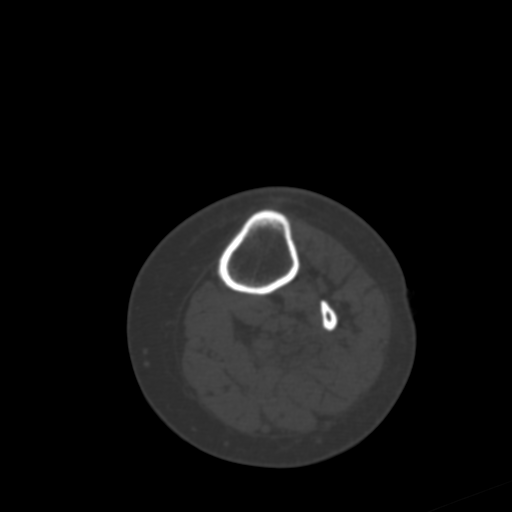
[im 22/93  bone]
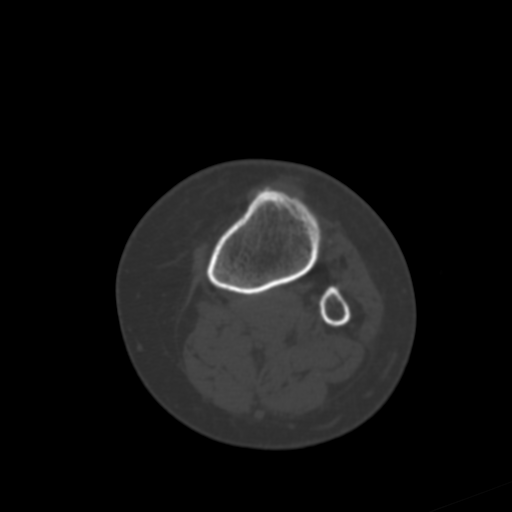
[im 29/93  bone]
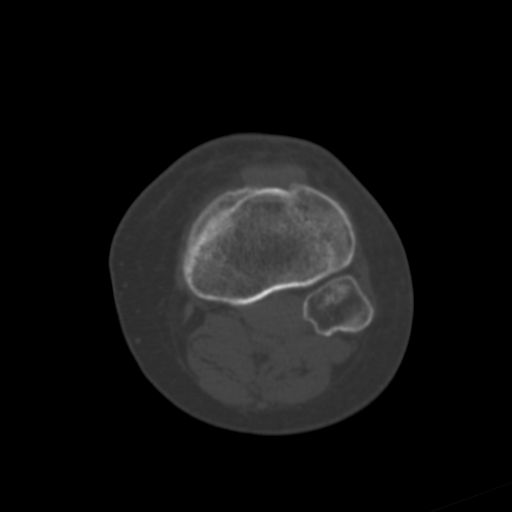
[im 43/93  bone]
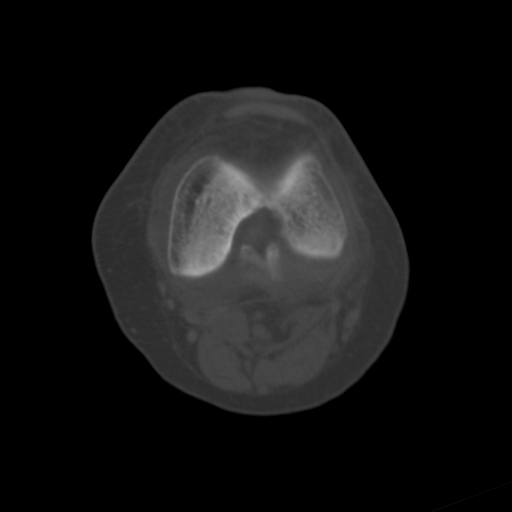
[im 50/93  soft-tissue]
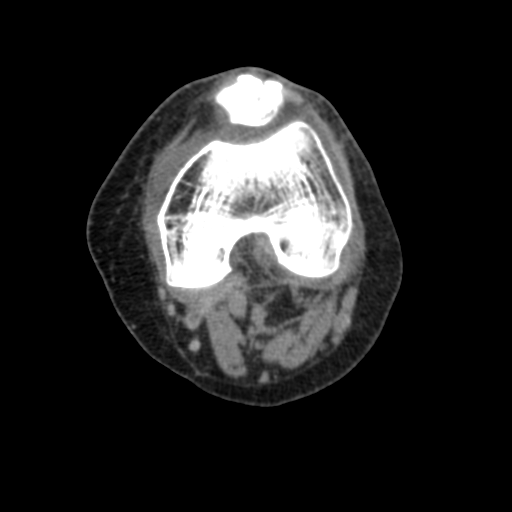
[im 50/93  bone]
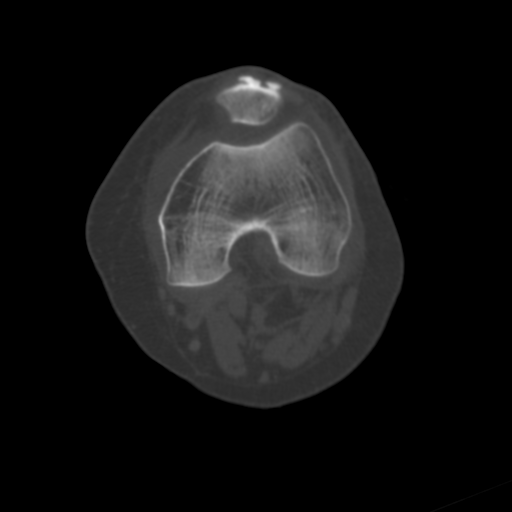
[im 64/93  bone]
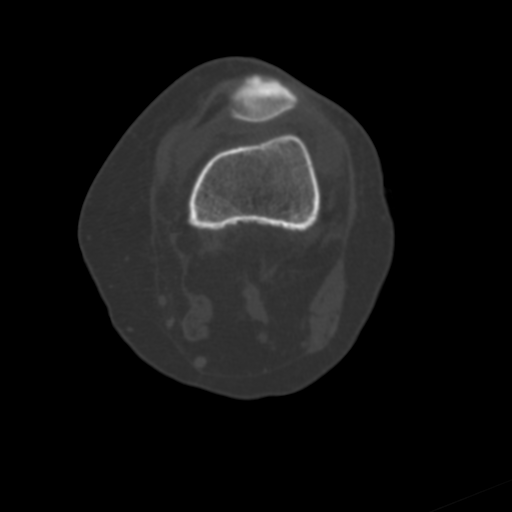
[im 71/93  bone]
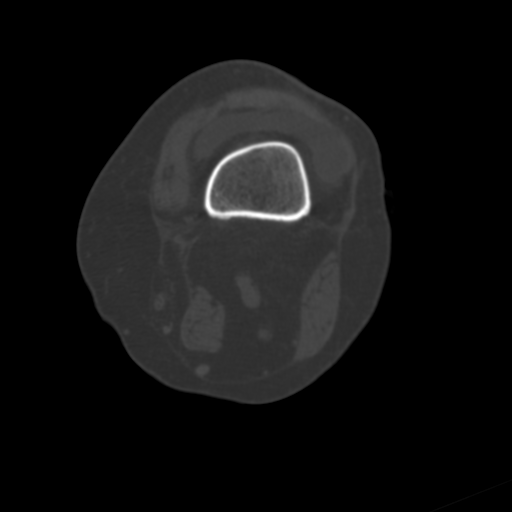
[im 85/93  bone]
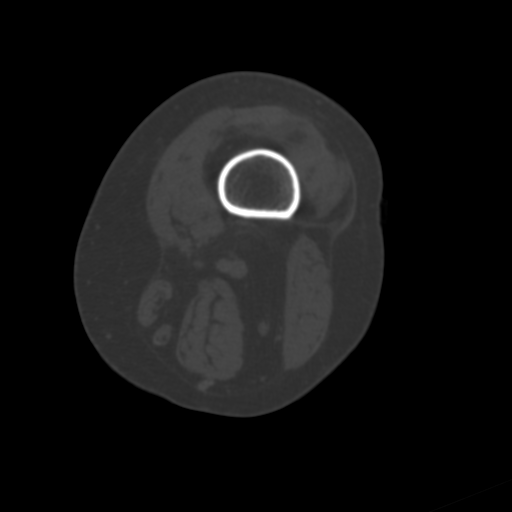

[Series 9: cor soft tissue · coronal · 0.36mm/px · 3 of 77 slices shown]
[im 16/77  bone]
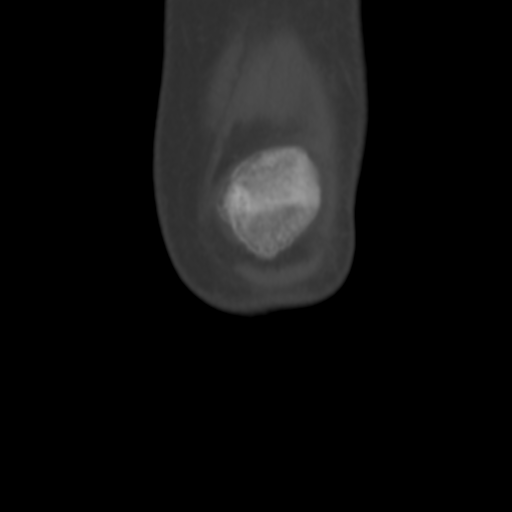
[im 31/77  bone]
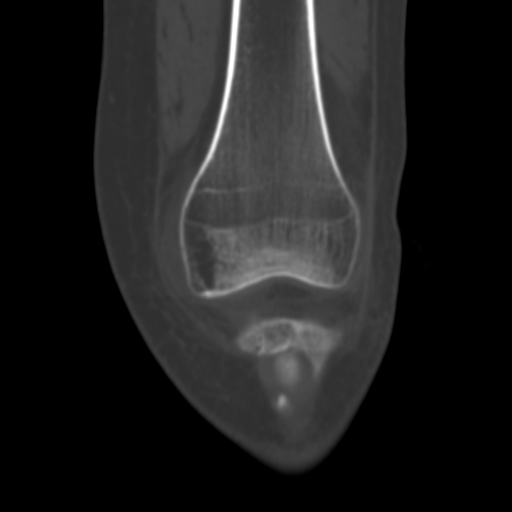
[im 46/77  bone]
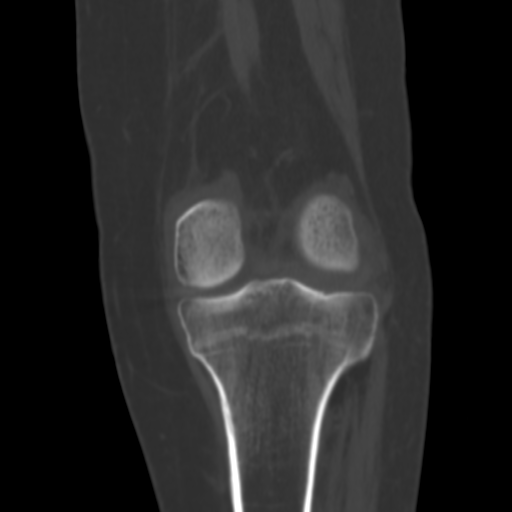

[Series 10: sagsoft tissue · sagittal · 0.34mm/px · 5 of 77 slices shown, 6 images]
[im 26/77  bone]
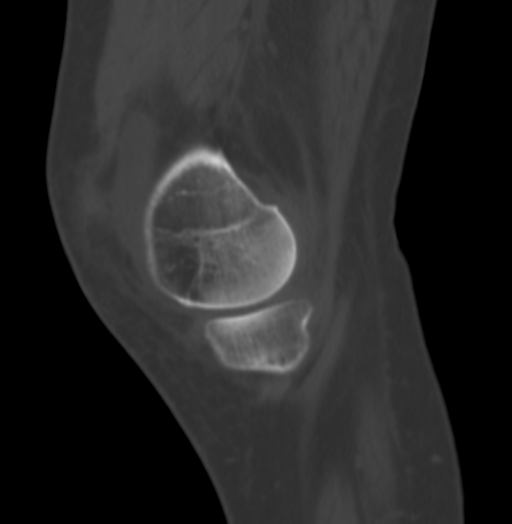
[im 32/77  bone]
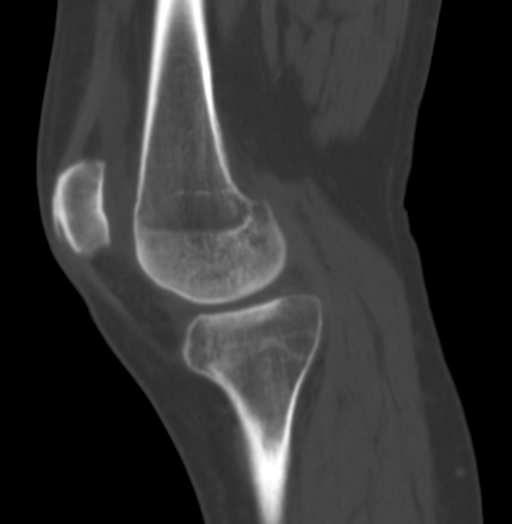
[im 39/77  soft-tissue]
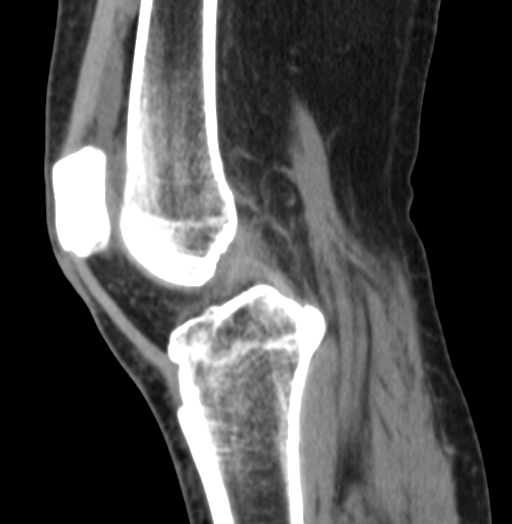
[im 39/77  bone]
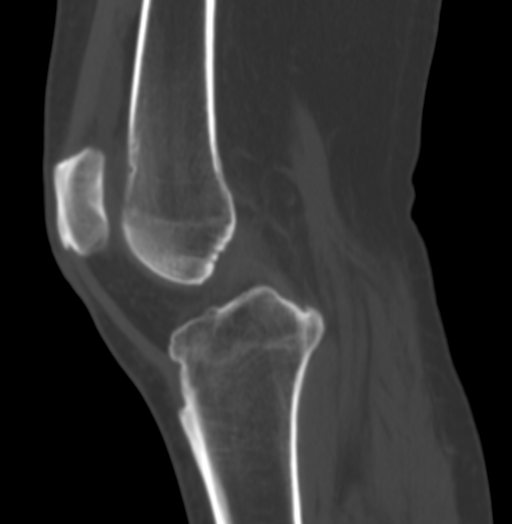
[im 45/77  bone]
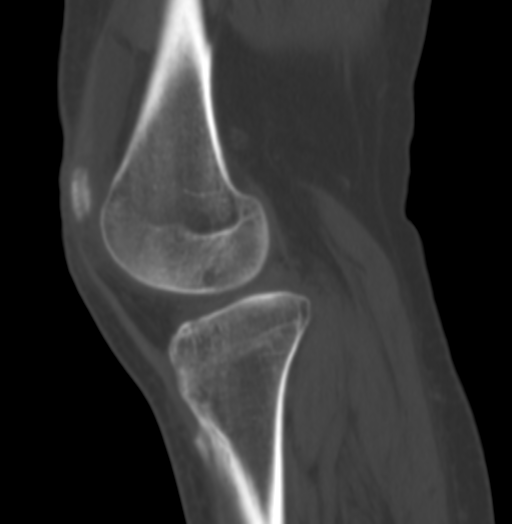
[im 51/77  bone]
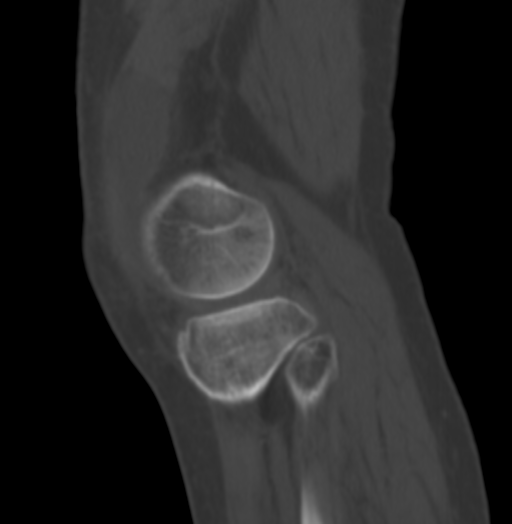

[16 of 33 positions shown; findings below may reference images not displayed]

FINDINGS: Bones/Joint/Cartilage

No acute fracture or dislocation. Old healed fracture of the lateral
tibial plateau. Mild medial compartment joint space narrowing. Small
joint effusion.

Ligaments

Ligaments are suboptimally evaluated by CT.

Muscles and Tendons
Grossly intact.

Soft tissue
No fluid collection or hematoma.  No soft tissue mass.
IMPRESSION: 1. No acute osseous abnormality. Old healed fracture of the lateral
tibial plateau.
2. Small joint effusion.

## 2020-08-17 ENCOUNTER — Ambulatory Visit (INDEPENDENT_AMBULATORY_CARE_PROVIDER_SITE_OTHER): Payer: Medicare Other | Admitting: Licensed Clinical Social Worker

## 2020-08-17 DIAGNOSIS — I10 Essential (primary) hypertension: Secondary | ICD-10-CM | POA: Diagnosis not present

## 2020-08-17 DIAGNOSIS — E785 Hyperlipidemia, unspecified: Secondary | ICD-10-CM

## 2020-08-17 DIAGNOSIS — E559 Vitamin D deficiency, unspecified: Secondary | ICD-10-CM

## 2020-08-17 DIAGNOSIS — N189 Chronic kidney disease, unspecified: Secondary | ICD-10-CM

## 2020-08-17 DIAGNOSIS — I4892 Unspecified atrial flutter: Secondary | ICD-10-CM

## 2020-08-17 DIAGNOSIS — G801 Spastic diplegic cerebral palsy: Secondary | ICD-10-CM

## 2020-08-17 NOTE — Patient Instructions (Addendum)
Licensed Clinical Social Worker Visit Information  Goals we discussed today:    .  Client will talk with LCSW in next 30 days about client interests in finding a job (pt-stated)       CARE PLAN ENTRY  Current Barriers:   Spastic Cerebral Palsy in client with Chronic diagnoses of Atrial Flutter, CKD, Vitamin D deficiency, HTN, HLD,   Speech challenges  Mobility challenges  Clinical Social Work Clinical Goal(s):   LCSW will call client/Bradley in next 30 days to talk about client  job interests  Interventions:  Talked with [name] ,mother of client, about CCM program  Talked with [name] about client's upcoming medical appointments  Talked with [name] about client history of heart surgery (2005)  Talked with [name] about client's past work history (received help through [employer])  Talked with [name] about client transportation She helps him with transport needs  Talked with [name] about client appetite and snacks  Talked with [name] about social support network for client   Talked with [name] about client relaxation techniques (plays mandolin, Chiropodist)  Talked with [name] about RNCM support with CCM program  Talked with [name] about hearing needs of client  Talked with [name] about vision of client (client has new glasses)  Talked with [name] about client job interests  Talked with [name] about sleeping issues of client  Patient Self Care Activities:   Uses exercise bike several times weekly Eats meals with set up assistance  Patient Self Care Deficits:   Speech challenges  Spastic Cerebral Palsy  Initial goal documentation    Follow Up Plan: LCSW to call client/Bradley Graham in next 4 weeks to talk with client or [name] about client job interests and to talk about client social work needs  Materials Provided: No  The patient/Bradley Graham, mother of patient,  verbalized understanding of instructions provided today and declined a  print copy of patient Paediatric nurse.   [name].Jaquilla Woodroof MSW, LCSW Licensed Clinical Social Worker [facility] [phone]

## 2020-08-17 NOTE — Chronic Care Management (AMB) (Signed)
Chronic Care Management    Clinical Social Work Follow Up Note  08/17/2020 Name: Bradley Graham MRN: [id number] DOB: [birth year]  Bradley Graham is a 49 y.o. year old male who is a primary care patient of Stacks, Cletus Gash, MD. The CCM team was consulted for assistance with [employer] .   Review of patient status, including review of consultants reports, other relevant assessments, and collaboration with appropriate care team members and the patient's provider was performed as part of comprehensive patient evaluation and provision of chronic care management services.    SDOH (Social Determinants of Health) assessments performed: No;risk for tobacco use; risk for depression; risk for physical inactivity; risk for stress    Chronic Care Management from 04/21/2020 in [place]  PHQ-9 Total Score 6       GAD 7 : Generalized Anxiety Score 04/21/2020  Nervous, Anxious, on Edge 0  Control/stop worrying 0  Worry too much - different things 0  Trouble relaxing 0  Restless 0  Easily annoyed or irritable 0  Afraid - awful might happen 0  Total GAD 7 Score 0  Anxiety Difficulty Not difficult at all    Outpatient Encounter Medications as of 08/17/2020  Medication Sig Note  . aspirin 81 MG tablet Take 81 mg by mouth daily.   . Cholecalciferol (VITAMIN D) 50 MCG (2000 UT) CAPS Take by mouth.   . diphenhydrAMINE (BENADRYL) 25 mg capsule Take 25 mg by mouth every 6 (six) hours as needed.   . docusate sodium (COLACE) 100 MG capsule Take 100 mg by mouth daily. 07/19/2017: PRN  . famotidine (PEPCID) 10 MG tablet Take 1 tablet (10 mg total) by mouth 2 (two) times daily.   . fluticasone (FLONASE) 50 MCG/ACT nasal spray Place 1 spray into both nostrils 2 (two) times daily.   Marland Kitchen loratadine (CLARITIN) 10 MG tablet Take 1 tablet (10 mg total) by mouth daily.   Marland Kitchen lovastatin (MEVACOR) 20 MG tablet Take 1 tablet (20 mg total) by mouth at bedtime. Needs to be seen   . olmesartan  (BENICAR) 20 MG tablet Take 1 tablet (20 mg total) by mouth daily.   . Vitamin D, Ergocalciferol, (DRISDOL) 1.25 MG (50000 UNIT) CAPS capsule Take 1 capsule (50,000 Units total) by mouth every 7 (seven) days.    No facility-administered encounter medications on file as of 08/17/2020.    Goals    .  Client will talk with LCSW in next 30 days about client interests in finding a job (pt-stated)      CARE PLAN ENTRY  Current Barriers:  . Spastic Cerebral Palsy in client with Chronic diagnoses of Atrial Flutter, CKD, Vitamin D deficiency, HTN, HLD,  . Speech challenges . Mobility challenges  Clinical Social Work Clinical Goal(s):  Marland Kitchen LCSW will call client/Connie in next 30 days to talk about client  job interests  Interventions: . Talked with [name] ,mother of client, about CCM program . Talked with [name] about client's upcoming medical appointments . Talked with [name] about client history of heart surgery (2005) . Talked with [name] about client's past work history (received help through [employer]) . Talked with [name] about client transportation She helps him with transport needs . Talked with [name] about client appetite and snacks . Talked with [name] about social support network for client  . Talked with [name] about client relaxation techniques (plays mandolin, guitar) . Talked with [name] about RNCM support with CCM program  Talked with [name] about  hearing needs of client  Talked with [name] about vision of client (client has new glasses)  Talked with [name] about client job interests  Talked with [name] about sleeping issues of client  Patient Self Care Activities:   Uses exercise bike several times weekly Eats meals with set up assistance  Patient Self Care Deficits:  . Speech challenges . Spastic Cerebral Palsy  Initial goal documentation    Follow Up Plan: LCSW to call client/Connie [place] in next 4 weeks to talk with client or [name] about  client job interests and to talk about client social work needs  [name].Ancel Easler MSW, LCSW Licensed Clinical Social Worker [facility] [phone]

## 2020-08-20 ENCOUNTER — Other Ambulatory Visit: Payer: Self-pay | Admitting: Family Medicine

## 2020-09-18 ENCOUNTER — Telehealth: Payer: Medicare Other

## 2020-10-05 DIAGNOSIS — Z880 Allergy status to penicillin: Secondary | ICD-10-CM | POA: Diagnosis not present

## 2020-10-05 DIAGNOSIS — Q213 Tetralogy of Fallot: Secondary | ICD-10-CM | POA: Diagnosis not present

## 2020-10-05 DIAGNOSIS — Z9889 Other specified postprocedural states: Secondary | ICD-10-CM | POA: Diagnosis not present

## 2020-10-05 DIAGNOSIS — K432 Incisional hernia without obstruction or gangrene: Secondary | ICD-10-CM | POA: Diagnosis not present

## 2020-10-05 DIAGNOSIS — Z79899 Other long term (current) drug therapy: Secondary | ICD-10-CM | POA: Diagnosis not present

## 2020-10-05 DIAGNOSIS — E785 Hyperlipidemia, unspecified: Secondary | ICD-10-CM | POA: Diagnosis not present

## 2020-10-05 DIAGNOSIS — D631 Anemia in chronic kidney disease: Secondary | ICD-10-CM | POA: Diagnosis not present

## 2020-10-05 DIAGNOSIS — Z881 Allergy status to other antibiotic agents status: Secondary | ICD-10-CM | POA: Diagnosis not present

## 2020-10-05 DIAGNOSIS — Z888 Allergy status to other drugs, medicaments and biological substances status: Secondary | ICD-10-CM | POA: Diagnosis not present

## 2020-10-05 DIAGNOSIS — J9690 Respiratory failure, unspecified, unspecified whether with hypoxia or hypercapnia: Secondary | ICD-10-CM | POA: Diagnosis not present

## 2020-10-05 DIAGNOSIS — Z886 Allergy status to analgesic agent status: Secondary | ICD-10-CM | POA: Diagnosis not present

## 2020-10-05 DIAGNOSIS — N183 Chronic kidney disease, stage 3 unspecified: Secondary | ICD-10-CM | POA: Diagnosis not present

## 2020-10-05 DIAGNOSIS — N179 Acute kidney failure, unspecified: Secondary | ICD-10-CM | POA: Diagnosis not present

## 2020-10-05 DIAGNOSIS — M109 Gout, unspecified: Secondary | ICD-10-CM | POA: Diagnosis not present

## 2020-10-05 DIAGNOSIS — I272 Pulmonary hypertension, unspecified: Secondary | ICD-10-CM | POA: Diagnosis not present

## 2020-10-05 DIAGNOSIS — Z23 Encounter for immunization: Secondary | ICD-10-CM | POA: Diagnosis not present

## 2020-10-05 DIAGNOSIS — Z882 Allergy status to sulfonamides status: Secondary | ICD-10-CM | POA: Diagnosis not present

## 2020-10-05 DIAGNOSIS — N1832 Chronic kidney disease, stage 3b: Secondary | ICD-10-CM | POA: Diagnosis not present

## 2020-10-05 DIAGNOSIS — I129 Hypertensive chronic kidney disease with stage 1 through stage 4 chronic kidney disease, or unspecified chronic kidney disease: Secondary | ICD-10-CM | POA: Diagnosis not present

## 2020-10-26 ENCOUNTER — Ambulatory Visit: Payer: Medicare Other | Admitting: Licensed Clinical Social Worker

## 2020-10-26 DIAGNOSIS — I10 Essential (primary) hypertension: Secondary | ICD-10-CM

## 2020-10-26 DIAGNOSIS — E559 Vitamin D deficiency, unspecified: Secondary | ICD-10-CM

## 2020-10-26 DIAGNOSIS — N189 Chronic kidney disease, unspecified: Secondary | ICD-10-CM

## 2020-10-26 DIAGNOSIS — I4892 Unspecified atrial flutter: Secondary | ICD-10-CM

## 2020-10-26 DIAGNOSIS — E785 Hyperlipidemia, unspecified: Secondary | ICD-10-CM

## 2020-10-26 NOTE — Chronic Care Management (AMB) (Signed)
Chronic Care Management    Clinical Social Work Follow Up Note  10/26/2020 Name: Bradley Graham MRN: [id number] DOB: 1971-09-01  Bradley Graham is a 49 y.o. year old male who is a primary care patient of Stacks, Cletus Gash, MD. The CCM team was consulted for assistance with [employer] .   Review of patient status, including review of consultants reports, other relevant assessments, and collaboration with appropriate care team members and the patient's provider was performed as part of comprehensive patient evaluation and provision of chronic care management services.    SDOH (Social Determinants of Health) assessments performed: No; risk for tobacco use; risk for depression; risk for stress; risk for physical inactivity    Chronic Care Management from 04/21/2020 in [place]  PHQ-9 Total Score 6       GAD 7 : Generalized Anxiety Score 04/21/2020  Nervous, Anxious, on Edge 0  Control/stop worrying 0  Worry too much - different things 0  Trouble relaxing 0  Restless 0  Easily annoyed or irritable 0  Afraid - awful might happen 0  Total GAD 7 Score 0  Anxiety Difficulty Not difficult at all    Outpatient Encounter Medications as of 10/26/2020  Medication Sig Note  . aspirin 81 MG tablet Take 81 mg by mouth daily.   . Cholecalciferol (VITAMIN D) 50 MCG (2000 UT) CAPS Take by mouth.   . diphenhydrAMINE (BENADRYL) 25 mg capsule Take 25 mg by mouth every 6 (six) hours as needed.   . docusate sodium (COLACE) 100 MG capsule Take 100 mg by mouth daily. 07/19/2017: PRN  . famotidine (PEPCID) 10 MG tablet Take 1 tablet (10 mg total) by mouth 2 (two) times daily.   . fluticasone (FLONASE) 50 MCG/ACT nasal spray PLACE 1 SPRAY INTO BOTH NOSTRILS 2 (TWO) TIMES DAILY.   Marland Kitchen loratadine (CLARITIN) 10 MG tablet Take 1 tablet (10 mg total) by mouth daily.   Marland Kitchen lovastatin (MEVACOR) 20 MG tablet Take 1 tablet (20 mg total) by mouth at bedtime. Needs to be seen   . olmesartan  (BENICAR) 20 MG tablet Take 1 tablet (20 mg total) by mouth daily.   . Vitamin D, Ergocalciferol, (DRISDOL) 1.25 MG (50000 UNIT) CAPS capsule Take 1 capsule (50,000 Units total) by mouth every 7 (seven) days.    No facility-administered encounter medications on file as of 10/26/2020.    Goals    .  Client will talk with LCSW in next 30 days about client interests in finding a job (pt-stated)      CARE PLAN ENTRY  Current Barriers:  . Spastic Cerebral Palsy in client with Chronic diagnoses of Atrial Flutter, CKD, Vitamin D deficiency, HTN, HLD,  . Speech challenges . Mobility challenges  Clinical Social Work Clinical Goal(s):  Marland Kitchen LCSW will call client/Bradley in next 30 days to talk about client  job interests  Interventions: . Talked with [name] ,mother of client, about CCM program support . Talked with [name] about client's upcoming medical appointments . Talked with [name] about client history of heart surgery (2005) . Talked with [name] about client transportation She helps him with transport needs . Talked with [name] about client appetite and snacks . Talked with [name] about social support network for client  . Talked with [name] about client relaxation techniques (plays mandolin, guitar, enjoys listening to music) . Talked with [name] about RNCM support with CCM program . Talked with [name] about hearing needs of client . Talked with [name]  about church involvement of client . Talked with [name] about recent client medical appointments . Talked with [name] about sleeping issues of client . Talked with [name] about vision of client . Talked with [name] about family stress issues of her son (client) and son's girlfriend . Talked with [name] about mood of client  Patient Self Care Activities:   Uses exercise bike several times weekly Eats meals with set up assistance  Patient Self Care Deficits:  . Speech challenges . Spastic Cerebral Palsy  Initial goal  documentation     Follow Up Plan: LCSW to call client/Bradley Graham in next 4 weeks to talk with client or [name] about client job interests and to talk about client social work needs  [name].Albirda Shiel MSW, LCSW Licensed Clinical Social Worker [facility] [phone]

## 2020-10-26 NOTE — Patient Instructions (Addendum)
Licensed Clinical Education officer, museum Visit Information  Goals we discussed today:   Client will talk with LCSW in next 30 days about client interests in finding a job (pt-stated)         CARE PLAN ENTRY  Current Barriers:   Spastic Cerebral Palsy in client with Chronic diagnoses of Atrial Flutter, CKD, Vitamin D deficiency, HTN, HLD,   Speech challenges  Mobility challenges  Clinical Social Work Clinical Goal(s):   LCSW will call client/Bradley in next 30 days to talk about client  job interests  Interventions:  Talked with [name] ,mother of client, about CCM program support  Talked with Amelio Brosky about client's upcoming medical appointments  Talked with [name] about client history of heart surgery (2005)  Talked with [name] about client transportation She helps him with transport needs  Talked with [name] about client appetite and snacks  Talked with [name] about social support network for client   Talked with [name] about client relaxation techniques (plays mandolin, Chiropodist, enjoys listening to music)  Talked with [name] about RNCM support with CCM program  Talked with [name] about hearing needs of client  Talked with [name] about church involvement of client  Talked with [name] about recent client medical appointments  Talked with [name] about sleeping issues of client  Talked with [name] about vision of client  Talked with [name] about family stress issues of her son (client) and son's girlfriend  Talked with [name] about mood of client  Patient Self Care Activities:   Uses exercise bike several times weekly Eats meals with set up assistance  Patient Self Care Deficits:   Speech challenges  Spastic Cerebral Palsy  Initial goal documentation     Follow Up Plan: LCSW to call client/Bradley Capano in next 4 weeks to talk with client or [name] about client job interestsand to talk about client social work needs  Materials Provided:  No  The patient/Bradley Graham, mother of client, verbalized understanding of instructions provided today and declined a print copy of patient Paediatric nurse.   [name].Josef Tourigny MSW, LCSW Licensed Clinical Social Worker [facility] [phone]

## 2020-12-01 ENCOUNTER — Telehealth: Payer: Medicare Other

## 2020-12-07 ENCOUNTER — Ambulatory Visit (INDEPENDENT_AMBULATORY_CARE_PROVIDER_SITE_OTHER): Payer: Medicare Other | Admitting: Family Medicine

## 2020-12-07 ENCOUNTER — Encounter: Payer: Self-pay | Admitting: Family Medicine

## 2020-12-07 DIAGNOSIS — M109 Gout, unspecified: Secondary | ICD-10-CM | POA: Diagnosis not present

## 2020-12-07 MED ORDER — PREDNISONE 10 MG PO TABS
ORAL_TABLET | ORAL | 0 refills | Status: DC
Start: 2020-12-07 — End: 2020-12-11

## 2020-12-07 NOTE — Progress Notes (Signed)
   Virtual Visit via telephone Note Due to COVID-19 pandemic this visit was conducted virtually. This visit type was conducted due to national recommendations for restrictions regarding the COVID-19 Pandemic (e.g. social distancing, sheltering in place) in an effort to limit this patient's exposure and mitigate transmission in our community. All issues noted in this document were discussed and addressed.  A physical exam was not performed with this format.  I [name] on 12/07/20 at 1229 by telephone and verified that I am speaking with the correct person using two identifiers. Bradley Graham is currently located at home and his mother is currently with him during visit. The provider, Gwenlyn Perking, FNP is located in their office at time of visit.  I discussed the limitations, risks, security and privacy concerns of performing an evaluation and management service by telephone and the availability of in person appointments. I also discussed with the patient that there may be a patient responsible charge related to this service. The patient expressed understanding and agreed to proceed.  CC: gout  History and Present Illness:  HPI  [name] reports a gout flare for about a month of his right foot. It hurts worse in his right big toe. His right toe is also a little swollen red. He is now having generalized pain in his right foot.  The pain is moderate. He has taken tylenol and ice with little improvment. He has been difficulty walking due to the pain. Denies fever, chills, spreading redness, shortness of breath, or chest pain. He is unable to take prophylaxis for gout or NSAIDs due to his kidney disease. He normally does well with prednisone for his gout flares.    ROS As per HPI.   Observations/Objective: Alert and oriented x 3.  Assessment and Plan: Diagnoses and all orders for this visit:  Gout of right foot, unspecified cause, unspecified chronicity Return to office for  new or worsening symptoms, or if symptoms persist.  -     predniSONE (DELTASONE) 10 MG tablet; Take 3 tablets (30 mg) once a day at the same time for 5 days for gout flare.   Follow Up Instructions: As needed.     I discussed the assessment and treatment plan with the patient. The patient was provided an opportunity to ask questions and all were answered. The patient agreed with the plan and demonstrated an understanding of the instructions.   The patient was advised to call back or seek an in-person evaluation if the symptoms worsen or if the condition fails to improve as anticipated.  The above assessment and management plan was discussed with the patient. The patient verbalized understanding of and has agreed to the management plan. Patient is aware to call the clinic if symptoms persist or worsen. Patient is aware when to return to the clinic for a follow-up visit. Patient educated on when it is appropriate to go to the emergency department.   Time call ended: 1240   I provided 15 minutes of non-face-to-face time during this encounter.    Gwenlyn Perking, FNP

## 2020-12-10 ENCOUNTER — Ambulatory Visit: Payer: Medicare Other | Admitting: Licensed Clinical Social Worker

## 2020-12-10 DIAGNOSIS — N189 Chronic kidney disease, unspecified: Secondary | ICD-10-CM

## 2020-12-10 DIAGNOSIS — I4892 Unspecified atrial flutter: Secondary | ICD-10-CM

## 2020-12-10 DIAGNOSIS — I10 Essential (primary) hypertension: Secondary | ICD-10-CM

## 2020-12-10 DIAGNOSIS — E559 Vitamin D deficiency, unspecified: Secondary | ICD-10-CM

## 2020-12-10 DIAGNOSIS — E785 Hyperlipidemia, unspecified: Secondary | ICD-10-CM

## 2020-12-10 NOTE — Patient Instructions (Addendum)
Licensed Clinical Social Worker Visit Information  Goals we discussed today:   .  Client will talk with LCSW in next 30 days about client interests in finding a job (pt-stated)        CARE PLAN ENTRY  Current Barriers:   Spastic Cerebral Palsy in client with Chronic diagnoses of Atrial Flutter, CKD, Vitamin D deficiency, HTN, HLD,   Speech challenges  Mobility challenges  Clinical Social Work Clinical Goal(s):   LCSW will call client/Bradley in next 30 days to talk about client  job interests  Interventions:   Talked with Bradley Graham, mother of client, about gout issues of client  Talked with Bradley Graham about pain issues of client  Talked with Bradley Graham about client's upcoming medical appointments  Talked with Bradley Graham about client transportation She helps him with transport needs  Talked with Bradley Graham about client appetite and snacks  Talked with Bradley Graham about social support network for client   Talked with Bradley Graham about RNCM support with CCM program  Talked with Bradley Graham about hearing needs of client  Talked with Bradley Graham about mobility of client (has been using a walker or a cane to help him walk)  Encouraged Bradley Graham to communicate with [facility] Triage Nurse as needed for nursing support  Talked with Bradley Graham about sleeping issues of client  Talked with Bradley Graham about energy level of client  Patient Self Care Activities:   Uses exercise bike several times weekly Eats meals with set up assistance  Patient Self Care Deficits:   Speech challenges  Spastic Cerebral Palsy  Initial goal documentation     Follow Up Plan: LCSW to call client/Bradley Graham in next 4 weeks to talk with client or Bradley Graham about client job interestsand to talk about client social work needs  Materials Provided: No  The patient Bradley Graham, mother of patient, verbalized understanding of instructions provided today and declined a print copy of patient instruction materials.   [name].Zhanna Melin MSW, LCSW Licensed Clinical Social Worker [facility] [phone]

## 2020-12-10 NOTE — Chronic Care Management (AMB) (Signed)
Chronic Care Management    Clinical Social Work Follow Up Note  12/10/2020 Name: Bradley Graham MRN: [id number] DOB: 13-Jan-1971  Haywood L Cagley is a 50 y.o. year old male who is a primary care patient of Stacks, Cletus Gash, MD. The CCM team was consulted for assistance with [employer] .   Review of patient status, including review of consultants reports, other relevant assessments, and collaboration with appropriate care team members and the patient's provider was performed as part of comprehensive patient evaluation and provision of chronic care management services.    SDOH (Social Determinants of Health) assessments performed: No; risk for tobacco use; risk for depression; risk for physical inactivity; risk for transport needs  Flowsheet Row Chronic Care Management from 04/21/2020 in [facility]  PHQ-9 Total Score 6     GAD 7 : Generalized Anxiety Score 04/21/2020  Nervous, Anxious, on Edge 0  Control/stop worrying 0  Worry too much - different things 0  Trouble relaxing 0  Restless 0  Easily annoyed or irritable 0  Afraid - awful might happen 0  Total GAD 7 Score 0  Anxiety Difficulty Not difficult at all    Outpatient Encounter Medications as of 12/10/2020  Medication Sig Note  . aspirin 81 MG tablet Take 81 mg by mouth daily.   . Cholecalciferol (VITAMIN D) 50 MCG (2000 UT) CAPS Take by mouth.   . diphenhydrAMINE (BENADRYL) 25 mg capsule Take 25 mg by mouth every 6 (six) hours as needed.   . docusate sodium (COLACE) 100 MG capsule Take 100 mg by mouth daily. 07/19/2017: PRN  . famotidine (PEPCID) 10 MG tablet Take 1 tablet (10 mg total) by mouth 2 (two) times daily.   . fluticasone (FLONASE) 50 MCG/ACT nasal spray PLACE 1 SPRAY INTO BOTH NOSTRILS 2 (TWO) TIMES DAILY.   Marland Kitchen loratadine (CLARITIN) 10 MG tablet Take 1 tablet (10 mg total) by mouth daily.   Marland Kitchen lovastatin (MEVACOR) 20 MG tablet Take 1 tablet (20 mg total) by mouth at bedtime. Needs to be seen   .  olmesartan (BENICAR) 20 MG tablet Take 1 tablet (20 mg total) by mouth daily.   . predniSONE (DELTASONE) 10 MG tablet Take 3 tablets (30 mg) once a day at the same time for 5 days for gout flare.   . Vitamin D, Ergocalciferol, (DRISDOL) 1.25 MG (50000 UNIT) CAPS capsule Take 1 capsule (50,000 Units total) by mouth every 7 (seven) days.    No facility-administered encounter medications on file as of 12/10/2020.    Goals    .  Client will talk with LCSW in next 30 days about client interests in finding a job (pt-stated)      CARE PLAN ENTRY  Current Barriers:  . Spastic Cerebral Palsy in client with Chronic diagnoses of Atrial Flutter, CKD, Vitamin D deficiency, HTN, HLD,  . Speech challenges . Mobility challenges  Clinical Social Work Clinical Goal(s):  Marland Kitchen LCSW will call client/Connie in next 30 days to talk about client  job interests  Interventions:  . Talked with [name], mother of client, about gout issues of client . Talked with [name] about pain issues of client . Talked with [name] about client's upcoming medical appointments . Talked with [name] about client transportation She helps him with transport needs . Talked with [name] about client appetite and snacks . Talked with [name] about social support network for client  . Talked with [name] about RNCM support with CCM program . Talked with  [name] about hearing needs of client . Talked with [name] about mobility of client (has been using a walker or a cane to help him walk) . Encouraged [name] to communicate with [facility] Triage Nurse as needed for nursing support . Talked with [name] about sleeping issues of client . Talked with [name] about energy level of client  Patient Self Care Activities:   Uses exercise bike several times weekly Eats meals with set up assistance  Patient Self Care Deficits:  . Speech challenges . Spastic Cerebral Palsy  Initial goal documentation     Follow Up Plan: LCSW to call  client/Connie Mckissic in next 4 weeks to talk with client or [name] about client job interestsand to talk about client social work needs  [name].Ousman Dise MSW, LCSW Licensed Clinical Social Worker [facility] [phone]

## 2020-12-11 ENCOUNTER — Other Ambulatory Visit: Payer: Self-pay | Admitting: Family Medicine

## 2020-12-11 ENCOUNTER — Telehealth: Payer: Self-pay

## 2020-12-11 MED ORDER — PREDNISONE 10 MG PO TABS: ORAL_TABLET | ORAL | 0 refills | Status: DC

## 2020-12-11 NOTE — Telephone Encounter (Signed)
No answer, mailbox full

## 2020-12-11 NOTE — Telephone Encounter (Signed)
Patient had an appointment with Tiffany on 1/3 for gout in his right foot and was given prednisone.  Mom states that the gout is better but not gone and would like to know if they can have extra treatment sent in ? Treating provider is off. PCP- please advise

## 2020-12-11 NOTE — Telephone Encounter (Signed)
Please let the patient know that I sent their prescription to their pharmacy. Thanks, WS 

## 2020-12-21 NOTE — Telephone Encounter (Signed)
Attempted to contact - mailboxfull Patient has not returned calls  Pharmacy would of also notified patient  This encounter will be closed

## 2021-01-01 ENCOUNTER — Telehealth (INDEPENDENT_AMBULATORY_CARE_PROVIDER_SITE_OTHER): Payer: Medicare Other | Admitting: Family Medicine

## 2021-01-01 ENCOUNTER — Encounter: Payer: Self-pay | Admitting: Family Medicine

## 2021-01-01 DIAGNOSIS — M10371 Gout due to renal impairment, right ankle and foot: Secondary | ICD-10-CM

## 2021-01-01 MED ORDER — PREDNISONE 10 MG PO TABS
ORAL_TABLET | ORAL | 0 refills | Status: DC
Start: 2021-01-01 — End: 2021-01-12

## 2021-01-01 NOTE — Progress Notes (Addendum)
MyChart Video visit  Subjective: CC: gout PCP: [name], MD BJ:[id number] L Southard is a 50 y.o. male. Patient provides verbal consent for consult held via video.  Due to COVID-19 pandemic this visit was conducted virtually. This visit type was conducted due to national recommendations for restrictions regarding the COVID-19 Pandemic (e.g. social distancing, sheltering in place) in an effort to limit this patient's exposure and mitigate transmission in our community. All issues noted in this document were discussed and addressed.  A physical exam was not performed with this format.   Location of patient: home  Location of provider: [facility] Others present for call: spouse  1. Gout flare Patient has been in flare for almost 3 weeks despite multiple rounds of prednisone.  He is unable to take NSAIDs due to CKD3b.  He is followed by Dr Joseph Berkshire at [facility].  Previously told that colchicine and allopurinol were not options for him. Reports that pain is predominantly in the right ankle and midfoot.  He is able to walk on that foot today but had not been able to do so yesterday.  Using topical Voltaren, lidocaine gels and ice currently.  Finished prednisone 2 days ago.       ROS: Per HPI  Allergies  Allergen Reactions  . Aleve [Naproxen Sodium]   . Bactrim [Sulfamethoxazole-Trimethoprim]   . Erythromycin   . Heparin     Induced thrombopenia  . Nsaids   . Penicillins   . Cortisporin [Bacitra-Neomycin-Polymyxin-Hc] Other (See Comments)    Burned ear canal per mom   Past Medical History:  Diagnosis Date  . Atrial fibrillation ([place])   . CHF (congestive heart failure) ([place])   . CKD (chronic kidney disease)   . Club foot of both feet   . Hypertension   . Left ventricular hypertrophy   . Right ventricular hypertrophy   . Speech impediment   . Tetralogy of Fallot    s/p waterson shunt  . Vitamin D deficiency     Current Outpatient Medications:  .  aspirin 81 MG tablet, Take 81 mg by mouth  daily., Disp: , Rfl:  .  Cholecalciferol (VITAMIN D) 50 MCG (2000 UT) CAPS, Take by mouth., Disp: , Rfl:  .  diphenhydrAMINE (BENADRYL) 25 mg capsule, Take 25 mg by mouth every 6 (six) hours as needed., Disp: , Rfl:  .  docusate sodium (COLACE) 100 MG capsule, Take 100 mg by mouth daily., Disp: , Rfl:  .  famotidine (PEPCID) 10 MG tablet, Take 1 tablet (10 mg total) by mouth 2 (two) times daily., Disp: 180 tablet, Rfl: 3 .  fluticasone (FLONASE) 50 MCG/ACT nasal spray, PLACE 1 SPRAY INTO BOTH NOSTRILS 2 (TWO) TIMES DAILY., Disp: 16 mL, Rfl: 5 .  loratadine (CLARITIN) 10 MG tablet, Take 1 tablet (10 mg total) by mouth daily., Disp: 90 tablet, Rfl: 1 .  lovastatin (MEVACOR) 20 MG tablet, Take 1 tablet (20 mg total) by mouth at bedtime. Needs to be seen, Disp: 90 tablet, Rfl: 1 .  olmesartan (BENICAR) 20 MG tablet, Take 1 tablet (20 mg total) by mouth daily., Disp: 90 tablet, Rfl: 1 .  predniSONE (DELTASONE) 10 MG tablet, Take 5 daily for 2 days followed by 4,3,2 and 1 for 2 days each., Disp: 30 tablet, Rfl: 0 .  Vitamin D, Ergocalciferol, (DRISDOL) 1.25 MG (50000 UNIT) CAPS capsule, Take 1 capsule (50,000 Units total) by mouth every 7 (seven) days., Disp: 13 capsule, Rfl: 3  Right foot: Erythema, soft tissue swelling noted along  the right midfoot and lateral ankle.  No skin breakdown appreciated  Assessment/ Plan: 50 y.o. male   Acute gout due to renal impairment involving right ankle - Plan: Renal Function Panel, Uric Acid, predniSONE (DELTASONE) 10 MG tablet, Ambulatory referral to Rheumatology  I have yet to hear back from his nephrologist.  My plan is to try and start colchicine and ultimately allopurinol (renally dosed of course) given his recurrent need for steroids.  I worry that the repeated use of steroids are going to start causing other problems.  For now I am going to place him on prednisone because we really not have many other options to control his symptoms over the weekend and  hopefully nephrology will get back to [place].  Start time: 9:10am (video); patient called back 1:31pm; 1:36pm Wife returned call End time: 9:23am (video); 1:31pm (no answer); 1:37pm call ended  Called nephrologist at 9:25am (after waiting for 33mn on hold, call was ended).  Phone number left for him to call back  **Addendum: Spoke [place]office.  Dr. RJerel Shepherdrecommendation was against use of colchicine.  He feels that this is contraindicated given GFR of 31.  He is actually recommending referral to rheumatology for ongoing gout.  Total time spent on patient care (including video visit/ documentation): 33 minutes  ASan Luis Obispo DMossyrock([phone]

## 2021-01-01 NOTE — Addendum Note (Signed)
Addended by: Janora Norlander on: 01/01/2021 02:42 PM   Modules accepted: Orders

## 2021-01-05 ENCOUNTER — Other Ambulatory Visit: Payer: Medicare Other

## 2021-01-05 ENCOUNTER — Other Ambulatory Visit: Payer: Self-pay

## 2021-01-05 DIAGNOSIS — M10371 Gout due to renal impairment, right ankle and foot: Secondary | ICD-10-CM

## 2021-01-06 LAB — RENAL FUNCTION PANEL
Albumin: 4.4 g/dL (ref 4.0–5.0)
BUN/Creatinine Ratio: 17 (ref 9–20)
BUN: 35 mg/dL — ABNORMAL HIGH (ref 6–24)
CO2: 23 mmol/L (ref 20–29)
Calcium: 9.2 mg/dL (ref 8.7–10.2)
Chloride: 91 mmol/L — ABNORMAL LOW (ref 96–106)
Creatinine, Ser: 2.08 mg/dL — ABNORMAL HIGH (ref 0.76–1.27)
GFR calc Af Amer: 42 mL/min/{1.73_m2} — ABNORMAL LOW (ref >59)
GFR calc non Af Amer: 36 mL/min/{1.73_m2} — ABNORMAL LOW (ref >59)
Glucose: 464 mg/dL (ref 65–99)
Phosphorus: 3.3 mg/dL (ref 2.8–4.1)
Potassium: 4.9 mmol/L (ref 3.5–5.2)
Sodium: 131 mmol/L — ABNORMAL LOW (ref 134–144)

## 2021-01-06 LAB — URIC ACID: Uric Acid: 7.2 mg/dL (ref 3.8–8.4)

## 2021-01-12 ENCOUNTER — Telehealth: Payer: Medicare Other

## 2021-01-12 ENCOUNTER — Encounter: Payer: Self-pay | Admitting: Family Medicine

## 2021-01-12 ENCOUNTER — Other Ambulatory Visit: Payer: Self-pay | Admitting: Family Medicine

## 2021-01-12 ENCOUNTER — Other Ambulatory Visit: Payer: Self-pay

## 2021-01-12 ENCOUNTER — Ambulatory Visit (INDEPENDENT_AMBULATORY_CARE_PROVIDER_SITE_OTHER): Payer: Medicare Other | Admitting: Family Medicine

## 2021-01-12 VITALS — BP 131/75 | HR 85 | Temp 97.2°F | Ht 63.0 in | Wt 181.2 lb

## 2021-01-12 DIAGNOSIS — E099 Drug or chemical induced diabetes mellitus without complications: Secondary | ICD-10-CM

## 2021-01-12 DIAGNOSIS — T380X5A Adverse effect of glucocorticoids and synthetic analogues, initial encounter: Secondary | ICD-10-CM

## 2021-01-12 DIAGNOSIS — R7309 Other abnormal glucose: Secondary | ICD-10-CM

## 2021-01-12 DIAGNOSIS — M10371 Gout due to renal impairment, right ankle and foot: Secondary | ICD-10-CM

## 2021-01-12 LAB — GLUCOSE HEMOCUE WAIVED: Glu Hemocue Waived: 312 mg/dL — ABNORMAL HIGH (ref 65–99)

## 2021-01-12 LAB — BAYER DCA HB A1C WAIVED: HB A1C (BAYER DCA - WAIVED): 10.2 % — ABNORMAL HIGH (ref <7.0)

## 2021-01-12 MED ORDER — FEBUXOSTAT 40 MG PO TABS: 40.0000 mg | ORAL_TABLET | Freq: Every day | ORAL | 1 refills | Status: DC

## 2021-01-12 MED ORDER — LINAGLIPTIN 5 MG PO TABS: 5.0000 mg | ORAL_TABLET | Freq: Every day | ORAL | 5 refills | Status: DC

## 2021-01-12 MED ORDER — TRAMADOL HCL 50 MG PO TABS: 50.0000 mg | ORAL_TABLET | Freq: Four times a day (QID) | ORAL | 2 refills | Status: AC | PRN

## 2021-01-12 MED ORDER — BLOOD GLUCOSE MONITOR KIT: PACK | 11 refills | Status: AC

## 2021-01-12 NOTE — Telephone Encounter (Signed)
PA started for Uloric 40 mg   Key: [id number] Sent to Plan today  Noted in PA - can not take allopurinol

## 2021-01-12 NOTE — Telephone Encounter (Signed)
Approved until 12/04/21 Pharm aware

## 2021-01-12 NOTE — Telephone Encounter (Signed)
Pharmacy comment:  Alternative Requested: PRIOR AUTHORIZATION REQUIRED OR ALTERNATIVE.

## 2021-01-12 NOTE — Progress Notes (Signed)
Subjective:  Patient ID: Bradley Graham, male    DOB: 10/15/1971  Age: 50 y.o. MRN: [id number]  CC: Gout and Hyperglycemia   HPI Bradley Graham presents for treatment of elevated blood sugars.  They have been at 3-400 range.  That is based on his used of steroids for his gout.  Bradley Graham could not tolerate allopurinol or colchicine due to the weakness of his kidneys.  As a result Bradley Graham has had to take 3 different courses of prednisone in the last month.  His blood sugar has spiked as a result.  Bradley Graham is currently not having any specific symptoms.  Depression screen [facility] 2/9 01/12/2021 07/27/2020 05/19/2020  Decreased Interest 0 0 0  Down, Depressed, Hopeless 0 0 0  PHQ - 2 Score 0 0 0  Altered sleeping - - -  Tired, decreased energy - - -  Change in appetite - - -  Feeling bad or failure about yourself  - - -  Trouble concentrating - - -  Moving slowly or fidgety/restless - - -  Suicidal thoughts - - -  PHQ-9 Score - - -  Difficult doing work/chores - - -    History Bradley Graham has a past medical history of Atrial fibrillation ([place]), CHF (congestive heart failure) ([place]), CKD (chronic kidney disease), Club foot of both feet, Hypertension, Left ventricular hypertrophy, Right ventricular hypertrophy, Speech impediment, Tetralogy of Fallot, and Vitamin D deficiency.   Bradley Graham has a past surgical history that includes VSD repair; scoliosis; Adenoidectomy; Mouth surgery; Wrist surgery (Left); and Abdominal hernia repair.   His family history includes Cancer in his cousin; Cancer (age of onset: 66) in his maternal grandfather; Diabetes in his father; Diabetes insipidus in his mother; Hyperlipidemia in his father and mother; Hypertension in his father; Hyperthyroidism in his mother and sister; Pernicious anemia in his maternal grandfather.Bradley Graham reports that Bradley Graham has never smoked. Bradley Graham has never used smokeless tobacco. Bradley Graham reports that Bradley Graham does not drink alcohol and does not use drugs.    ROS Review of Systems  Constitutional:  Negative for fever.  Respiratory: Negative for shortness of breath.   Cardiovascular: Negative for chest pain.  Endocrine: Negative for polyphagia and polyuria.  Genitourinary: Negative for difficulty urinating and dysuria.  Musculoskeletal: Negative for arthralgias.  Skin: Negative for rash.    Objective:  BP 131/75   Pulse 85   Temp (!) 97.2 F (36.2 C) (Temporal)   Ht 5' 3" (1.6 m)   Wt 181 lb 3.2 oz (82.2 kg)   BMI 32.10 kg/m   BP Readings from Last 3 Encounters:  01/12/21 131/75  07/27/20 127/79  05/19/20 119/77    Wt Readings from Last 3 Encounters:  01/12/21 181 lb 3.2 oz (82.2 kg)  07/27/20 177 lb 8 oz (80.5 kg)  05/19/20 180 lb (81.6 kg)     Physical Exam Vitals reviewed.  Constitutional:      Appearance: Bradley Graham is well-developed and well-nourished.  HENT:     Head: Normocephalic and atraumatic.     Right Ear: Tympanic membrane and external ear normal. No decreased hearing noted.     Left Ear: Tympanic membrane and external ear normal. No decreased hearing noted.     Mouth/Throat:     Pharynx: No oropharyngeal exudate or posterior oropharyngeal erythema.  Eyes:     Pupils: Pupils are equal, round, and reactive to light.  Cardiovascular:     Rate and Rhythm: Normal rate and regular rhythm.     Heart sounds: No  murmur heard.   Pulmonary:     Effort: No respiratory distress.     Breath sounds: Normal breath sounds.  Abdominal:     General: Bowel sounds are normal.     Palpations: Abdomen is soft. There is no mass.     Tenderness: There is no abdominal tenderness.  Musculoskeletal:     Cervical back: Normal range of motion and neck supple.    Results for orders placed or performed in visit on 01/12/21  Glucose Hemocue Waived  Result Value Ref Range   Glu Hemocue Waived 312 (H) 65 - 99 mg/dL  Bayer DCA Hb A1c Waived  Result Value Ref Range   HB A1C (BAYER DCA - WAIVED) 10.2 (H) <7.0 %      Assessment & Plan:   Bradley Graham was seen today for gout and  hyperglycemia.  Diagnoses and all orders for this visit:  Elevated glucose -     Glucose Hemocue Waived -     Bayer DCA Hb A1c Waived  Steroid-induced diabetes mellitus, initial encounter ([place]) -     Bayer DCA Hb A1c Waived  Other orders -     traMADol (ULTRAM) 50 MG tablet; Take 1 tablet (50 mg total) by mouth 4 (four) times daily as needed for up to 5 days for moderate pain. -     Discontinue: febuxostat (ULORIC) 40 MG tablet; Take 1 tablet (40 mg total) by mouth daily. -     linagliptin (TRADJENTA) 5 MG TABS tablet; Take 1 tablet (5 mg total) by mouth daily. For diabetes -     blood glucose meter kit and supplies KIT; Dispense based on patient and insurance preference. Use up to four times daily as directed. (FOR ICD-10 : E11.9       I have discontinued Lesslie L. [name]'s predniSONE. I am also having him start on traMADol, linagliptin, and blood glucose meter kit and supplies. Additionally, I am having him maintain his docusate sodium, aspirin, Vitamin D, diphenhydrAMINE, olmesartan, lovastatin, loratadine, famotidine, Vitamin D (Ergocalciferol), and fluticasone.  Allergies as of 01/12/2021      Reactions   Aleve [naproxen Sodium]    Bactrim [sulfamethoxazole-trimethoprim]    Erythromycin    Heparin    Induced thrombopenia   Nsaids    Penicillins    Cortisporin [bacitra-neomycin-polymyxin-hc] Other (See Comments)   Burned ear canal per mom      Medication List       Accurate as of January 12, 2021 11:59 PM. If you have any questions, ask your nurse or doctor.        STOP taking these medications   predniSONE 10 MG tablet Commonly known as: DELTASONE Stopped by: [name], MD     TAKE these medications   aspirin 81 MG tablet Take 81 mg by mouth daily.   blood glucose meter kit and supplies Kit Dispense based on patient and insurance preference. Use up to four times daily as directed. (FOR ICD-10 : E11.9 Started by: [name], MD   diphenhydrAMINE 25 mg  capsule Commonly known as: BENADRYL Take 25 mg by mouth every 6 (six) hours as needed.   docusate sodium 100 MG capsule Commonly known as: COLACE Take 100 mg by mouth daily.   famotidine 10 MG tablet Commonly known as: PEPCID Take 1 tablet (10 mg total) by mouth 2 (two) times daily.   febuxostat 40 MG tablet Commonly known as: ULORIC TAKE 1 TABLET BY MOUTH EVERY DAY Started by: [name], MD  fluticasone 50 MCG/ACT nasal spray Commonly known as: FLONASE PLACE 1 SPRAY INTO BOTH NOSTRILS 2 (TWO) TIMES DAILY.   linagliptin 5 MG Tabs tablet Commonly known as: Tradjenta Take 1 tablet (5 mg total) by mouth daily. For diabetes Started by: [name], MD   loratadine 10 MG tablet Commonly known as: CLARITIN Take 1 tablet (10 mg total) by mouth daily.   lovastatin 20 MG tablet Commonly known as: MEVACOR Take 1 tablet (20 mg total) by mouth at bedtime. Needs to be seen   olmesartan 20 MG tablet Commonly known as: Benicar Take 1 tablet (20 mg total) by mouth daily.   traMADol 50 MG tablet Commonly known as: ULTRAM Take 1 tablet (50 mg total) by mouth 4 (four) times daily as needed for up to 5 days for moderate pain. Started by: [name], MD   Vitamin D (Ergocalciferol) 1.25 MG (50000 UNIT) Caps capsule Commonly known as: DRISDOL Take 1 capsule (50,000 Units total) by mouth every 7 (seven) days.   Vitamin D 50 MCG (2000 UT) Caps Take by mouth.        Follow-up: No follow-ups on file.  [name], M.D.

## 2021-01-14 ENCOUNTER — Telehealth: Payer: Self-pay

## 2021-01-14 MED ORDER — ACCU-CHEK SOFTCLIX LANCETS MISC: 3 refills | Status: DC

## 2021-01-14 MED ORDER — ACCU-CHEK GUIDE VI STRP: ORAL_STRIP | 3 refills | Status: DC

## 2021-01-14 NOTE — Telephone Encounter (Signed)
Pt aware sent to pharmacy 

## 2021-01-14 NOTE — Telephone Encounter (Signed)
  Prescription Request  01/14/2021  What is the name of the medication or equipment? Pt needs more lancets for blood sugar meter  Have you contacted your pharmacy to request a refill? (if applicable) no  Which [facility] you [facility]   Patient notified that their request is being sent to the clinical staff for review and that they should receive a response within 2 business days.

## 2021-01-15 ENCOUNTER — Encounter: Payer: Self-pay | Admitting: Family Medicine

## 2021-01-18 DIAGNOSIS — M1A09X0 Idiopathic chronic gout, multiple sites, without tophus (tophi): Secondary | ICD-10-CM | POA: Diagnosis not present

## 2021-01-18 DIAGNOSIS — M2550 Pain in unspecified joint: Secondary | ICD-10-CM | POA: Diagnosis not present

## 2021-01-27 ENCOUNTER — Ambulatory Visit: Payer: Medicare Other | Admitting: Family Medicine

## 2021-02-01 ENCOUNTER — Other Ambulatory Visit: Payer: Self-pay | Admitting: Family Medicine

## 2021-02-03 ENCOUNTER — Other Ambulatory Visit: Payer: Self-pay

## 2021-02-03 ENCOUNTER — Ambulatory Visit (INDEPENDENT_AMBULATORY_CARE_PROVIDER_SITE_OTHER): Payer: Medicare Other | Admitting: Family Medicine

## 2021-02-03 ENCOUNTER — Encounter: Payer: Self-pay | Admitting: Family Medicine

## 2021-02-03 VITALS — BP 111/67 | HR 92 | Temp 98.2°F | Ht 63.0 in | Wt 179.8 lb

## 2021-02-03 DIAGNOSIS — T380X5A Adverse effect of glucocorticoids and synthetic analogues, initial encounter: Secondary | ICD-10-CM | POA: Diagnosis not present

## 2021-02-03 DIAGNOSIS — M2042 Other hammer toe(s) (acquired), left foot: Secondary | ICD-10-CM | POA: Diagnosis not present

## 2021-02-03 DIAGNOSIS — E099 Drug or chemical induced diabetes mellitus without complications: Secondary | ICD-10-CM | POA: Diagnosis not present

## 2021-02-03 DIAGNOSIS — I10 Essential (primary) hypertension: Secondary | ICD-10-CM | POA: Diagnosis not present

## 2021-02-03 DIAGNOSIS — E785 Hyperlipidemia, unspecified: Secondary | ICD-10-CM

## 2021-02-03 NOTE — Progress Notes (Signed)
Subjective:  Patient ID: Bradley Graham, male    DOB: January 04, 1971  Age: 50 y.o. MRN: [id number]  CC:  HPI Bradley Graham presents for recheck of diabetes. Last 2 weeks glucose is 115 or so since starting the tradjenta.   Left foot hammer toe. Reconstruction 15 years ago. Has 4 mm ulcer on top ot the 1st toe at IP. Needs referral back to the surgeon who did the surgery.  Depression screen [facility] 2/9 02/03/2021 02/03/2021 01/12/2021  Decreased Interest 0 0 0  Down, Depressed, Hopeless 0 0 0  PHQ - 2 Score 0 0 0  Altered sleeping - - -  Tired, decreased energy - - -  Change in appetite - - -  Feeling bad or failure about yourself  - - -  Trouble concentrating - - -  Moving slowly or fidgety/restless - - -  Suicidal thoughts - - -  PHQ-9 Score - - -  Difficult doing work/chores - - -    History Bradley Graham has a past medical history of Atrial fibrillation ([place]), CHF (congestive heart failure) ([place]), CKD (chronic kidney disease), Club foot of both feet, Hypertension, Left ventricular hypertrophy, Right ventricular hypertrophy, Speech impediment, Tetralogy of Fallot, and Vitamin D deficiency.   Bradley Graham has a past surgical history that includes VSD repair; scoliosis; Adenoidectomy; Mouth surgery; Wrist surgery (Left); and Abdominal hernia repair.   His family history includes Cancer in his cousin; Cancer (age of onset: 55) in his maternal grandfather; Diabetes in his father; Diabetes insipidus in his mother; Hyperlipidemia in his father and mother; Hypertension in his father; Hyperthyroidism in his mother and sister; Pernicious anemia in his maternal grandfather.Bradley Graham reports that Bradley Graham has never smoked. Bradley Graham has never used smokeless tobacco. Bradley Graham reports that Bradley Graham does not drink alcohol and does not use drugs.    ROS Review of Systems  Constitutional: Negative for fever.  Respiratory: Negative for shortness of breath.   Cardiovascular: Negative for chest pain.  Musculoskeletal: Negative for arthralgias.  Skin:  Negative for rash.    Objective:  BP 111/67   Pulse 92   Temp 98.2 F (36.8 C)   Ht '5\' 3"'  (1.6 m)   Wt 179 lb 12.8 oz (81.6 kg)   BMI 31.85 kg/m   BP Readings from Last 3 Encounters:  02/03/21 111/67  01/12/21 131/75  07/27/20 127/79    Wt Readings from Last 3 Encounters:  02/03/21 179 lb 12.8 oz (81.6 kg)  01/12/21 181 lb 3.2 oz (82.2 kg)  07/27/20 177 lb 8 oz (80.5 kg)     Physical Exam Vitals reviewed.  Constitutional:      Appearance: Bradley Graham is well-developed and well-nourished.  HENT:     Head: Normocephalic and atraumatic.     Right Ear: External ear normal.     Left Ear: External ear normal.     Mouth/Throat:     Pharynx: No oropharyngeal exudate or posterior oropharyngeal erythema.  Eyes:     Pupils: Pupils are equal, round, and reactive to light.  Cardiovascular:     Rate and Rhythm: Normal rate and regular rhythm.     Heart sounds: No murmur heard.   Pulmonary:     Effort: No respiratory distress.     Breath sounds: Normal breath sounds.  Musculoskeletal:     Cervical back: Normal range of motion and neck supple.  Neurological:     Mental Status: Bradley Graham is alert and oriented to person, place, and time.  Assessment & Plan:   Bradley Graham was seen today for chronic kidney disease.  Diagnoses and all orders for this visit:  Hyperlipidemia, unspecified hyperlipidemia type -     CBC with Differential/Platelet -     CMP14+EGFR -     Lipid panel  Essential hypertension, benign -     CBC with Differential/Platelet -     CMP14+EGFR -     Lipid panel  Steroid-induced diabetes mellitus, initial encounter ([place]) -     Microalbumin / creatinine urine ratio -     CBC with Differential/Platelet -     CMP14+EGFR -     Lipid panel  Hammer toe of left foot -     Ambulatory referral to Orthopedics       I am having Bradley Graham maintain his docusate sodium, aspirin, Vitamin D, diphenhydrAMINE, olmesartan, lovastatin, loratadine, famotidine, Vitamin D  (Ergocalciferol), fluticasone, linagliptin, blood glucose meter kit and supplies, febuxostat, Accu-Chek Softclix Lancets, Accu-Chek Guide, allopurinol, and Colchicine.  Allergies as of 02/03/2021      Reactions   Aleve [naproxen Sodium]    Bactrim [sulfamethoxazole-trimethoprim]    Erythromycin    Heparin    Induced thrombopenia   Nsaids    Penicillins    Cortisporin [bacitra-neomycin-polymyxin-hc] Other (See Comments)   Burned ear canal per mom      Medication List       Accurate as of February 03, 2021  8:57 PM. If you have any questions, ask your nurse or doctor.        Accu-Chek Guide test strip Generic drug: glucose blood Test BS QID Dx E11.9   Accu-Chek Softclix Lancets lancets Test BS QID Dx E11.9   allopurinol 100 MG tablet Commonly known as: ZYLOPRIM Take 50 mg by mouth daily.   aspirin 81 MG tablet Take 81 mg by mouth daily.   blood glucose meter kit and supplies Kit Dispense based on patient and insurance preference. Use up to four times daily as directed. (FOR ICD-10 : E11.9   Colchicine 0.6 MG Caps Take 1 capsule by mouth every other day.   diphenhydrAMINE 25 mg capsule Commonly known as: BENADRYL Take 25 mg by mouth every 6 (six) hours as needed.   docusate sodium 100 MG capsule Commonly known as: COLACE Take 100 mg by mouth daily.   famotidine 10 MG tablet Commonly known as: PEPCID Take 1 tablet (10 mg total) by mouth 2 (two) times daily.   febuxostat 40 MG tablet Commonly known as: ULORIC TAKE 1 TABLET BY MOUTH EVERY DAY   fluticasone 50 MCG/ACT nasal spray Commonly known as: FLONASE PLACE 1 SPRAY INTO BOTH NOSTRILS 2 (TWO) TIMES DAILY.   linagliptin 5 MG Tabs tablet Commonly known as: Tradjenta Take 1 tablet (5 mg total) by mouth daily. For diabetes   loratadine 10 MG tablet Commonly known as: CLARITIN Take 1 tablet (10 mg total) by mouth daily.   lovastatin 20 MG tablet Commonly known as: MEVACOR Take 1 tablet (20 mg total) by mouth  at bedtime. Needs to be seen   olmesartan 20 MG tablet Commonly known as: Benicar Take 1 tablet (20 mg total) by mouth daily.   Vitamin D (Ergocalciferol) 1.25 MG (50000 UNIT) Caps capsule Commonly known as: DRISDOL Take 1 capsule (50,000 Units total) by mouth every 7 (seven) days.   Vitamin D 50 MCG (2000 UT) Caps Take by mouth.        Follow-up: Return in about 2 months (around 04/05/2021).  [name], M.D.

## 2021-02-04 LAB — CMP14+EGFR
ALT: 15 IU/L (ref 0–44)
AST: 19 IU/L (ref 0–40)
Albumin/Globulin Ratio: 2.3 — ABNORMAL HIGH (ref 1.2–2.2)
Albumin: 4.6 g/dL (ref 4.0–5.0)
Alkaline Phosphatase: 82 IU/L (ref 44–121)
BUN/Creatinine Ratio: 15 (ref 9–20)
BUN: 29 mg/dL — ABNORMAL HIGH (ref 6–24)
Bilirubin Total: 0.4 mg/dL (ref 0.0–1.2)
CO2: 21 mmol/L (ref 20–29)
Calcium: 9.5 mg/dL (ref 8.7–10.2)
Chloride: 101 mmol/L (ref 96–106)
Creatinine, Ser: 1.94 mg/dL — ABNORMAL HIGH (ref 0.76–1.27)
Globulin, Total: 2 g/dL (ref 1.5–4.5)
Glucose: 113 mg/dL — ABNORMAL HIGH (ref 65–99)
Potassium: 5.2 mmol/L (ref 3.5–5.2)
Sodium: 139 mmol/L (ref 134–144)
Total Protein: 6.6 g/dL (ref 6.0–8.5)
eGFR: 42 mL/min/{1.73_m2} — ABNORMAL LOW (ref >59)

## 2021-02-04 LAB — CBC WITH DIFFERENTIAL/PLATELET
Basophils Absolute: 0.1 10*3/uL (ref 0.0–0.2)
Basos: 1 %
EOS (ABSOLUTE): 0.7 10*3/uL — ABNORMAL HIGH (ref 0.0–0.4)
Eos: 10 %
Hematocrit: 38.5 % (ref 37.5–51.0)
Hemoglobin: 12.9 g/dL — ABNORMAL LOW (ref 13.0–17.7)
Immature Grans (Abs): 0.1 10*3/uL (ref 0.0–0.1)
Immature Granulocytes: 1 %
Lymphocytes Absolute: 1.6 10*3/uL (ref 0.7–3.1)
Lymphs: 24 %
MCH: 30.6 pg (ref 26.6–33.0)
MCHC: 33.5 g/dL (ref 31.5–35.7)
MCV: 91 fL (ref 79–97)
Monocytes Absolute: 0.6 10*3/uL (ref 0.1–0.9)
Monocytes: 9 %
Neutrophils Absolute: 3.8 10*3/uL (ref 1.4–7.0)
Neutrophils: 55 %
Platelets: 183 10*3/uL (ref 150–450)
RBC: 4.22 x10E6/uL (ref 4.14–5.80)
RDW: 12.2 % (ref 11.6–15.4)
WBC: 6.9 10*3/uL (ref 3.4–10.8)

## 2021-02-04 LAB — LIPID PANEL
Chol/HDL Ratio: 2.7 ratio (ref 0.0–5.0)
Cholesterol, Total: 155 mg/dL (ref 100–199)
HDL: 57 mg/dL (ref >39)
LDL Chol Calc (NIH): 77 mg/dL (ref 0–99)
Triglycerides: 115 mg/dL (ref 0–149)
VLDL Cholesterol Cal: 21 mg/dL (ref 5–40)

## 2021-02-11 ENCOUNTER — Ambulatory Visit (INDEPENDENT_AMBULATORY_CARE_PROVIDER_SITE_OTHER): Payer: Medicare Other | Admitting: Family Medicine

## 2021-02-11 DIAGNOSIS — H9203 Otalgia, bilateral: Secondary | ICD-10-CM

## 2021-02-11 DIAGNOSIS — J069 Acute upper respiratory infection, unspecified: Secondary | ICD-10-CM | POA: Diagnosis not present

## 2021-02-11 MED ORDER — DOXYCYCLINE HYCLATE 100 MG PO TABS
100.0000 mg | ORAL_TABLET | Freq: Two times a day (BID) | ORAL | 0 refills | Status: AC
Start: 2021-02-11 — End: 2021-02-18

## 2021-02-11 NOTE — Progress Notes (Signed)
° °  Virtual Visit via telephone Note Due to COVID-19 pandemic this visit was conducted virtually. This visit type was conducted due to national recommendations for restrictions regarding the COVID-19 Pandemic (e.g. social distancing, sheltering in place) in an effort to limit this patient's exposure and mitigate transmission in our community. All issues noted in this document were discussed and addressed.  A physical exam was not performed with this format.  I [name] on 02/11/21 at 1340 by telephone and verified that I am speaking with the correct person using two identifiers. Bradley Graham is currently located at home and his mother is currently with him during the visit. The provider, Gwenlyn Perking, FNP is located in their office at time of visit.  I discussed the limitations, risks, security and privacy concerns of performing an evaluation and management service by telephone and the availability of in person appointments. I also discussed with the patient that there may be a patient responsible charge related to this service. The patient expressed understanding and agreed to proceed.  CC: congestion  History and Present Illness:  HPI  History is provided by Bradley Graham's caregiver. Bradley Graham has has nasal congestion x 2 days. He has also had chills, sore throat, and a dry cough. He has bilateral ear pain as well with drainage. It is not uncommon for his ears to drain. He typically needs antibiotics when he gets sick due to his history. He will be going to the ENT on Monday for his 6 month follow up. Denies fever, chest pain, or shortness of breath.    ROS As per HPI.  Observations/Objective: Exam deferred due to telephone visit.   Assessment and Plan: Bradley Graham was seen today for nasal congestion.  Diagnoses and all orders for this visit:  Upper respiratory tract infection, unspecified type Will treat with doxycycline as he is allergic to penicillins. Mucinex as needed for  congestion, tylenol for pain. Return to office for new or worsening symptoms, or if symptoms persist.  -     doxycycline (VIBRA-TABS) 100 MG tablet; Take 1 tablet (100 mg total) by mouth 2 (two) times daily for 7 days. 1 po bid  Otalgia of both ears Tylenol for pain. Has ENT appointment on Monday.  -     doxycycline (VIBRA-TABS) 100 MG tablet; Take 1 tablet (100 mg total) by mouth 2 (two) times daily for 7 days. 1 po bid    Follow Up Instructions: Return to office for new or worsening symptoms, or if symptoms persist.     I discussed the assessment and treatment plan with the patient. The patient was provided an opportunity to ask questions and all were answered. The patient agreed with the plan and demonstrated an understanding of the instructions.   The patient was advised to call back or seek an in-person evaluation if the symptoms worsen or if the condition fails to improve as anticipated.  The above assessment and management plan was discussed with the patient. The patient verbalized understanding of and has agreed to the management plan. Patient is aware to call the clinic if symptoms persist or worsen. Patient is aware when to return to the clinic for a follow-up visit. Patient educated on when it is appropriate to go to the emergency department.   Time call ended:  1352  I provided 12 minutes of phone time during this encounter.    Gwenlyn Perking, FNP

## 2021-02-12 ENCOUNTER — Telehealth: Payer: Medicare Other

## 2021-02-15 DIAGNOSIS — H61893 Other specified disorders of external ear, bilateral: Secondary | ICD-10-CM | POA: Diagnosis not present

## 2021-02-15 DIAGNOSIS — H6123 Impacted cerumen, bilateral: Secondary | ICD-10-CM | POA: Diagnosis not present

## 2021-02-22 ENCOUNTER — Ambulatory Visit (INDEPENDENT_AMBULATORY_CARE_PROVIDER_SITE_OTHER): Payer: Medicare Other | Admitting: *Deleted

## 2021-02-22 DIAGNOSIS — Z0000 Encounter for general adult medical examination without abnormal findings: Secondary | ICD-10-CM

## 2021-02-22 DIAGNOSIS — M1A09X0 Idiopathic chronic gout, multiple sites, without tophus (tophi): Secondary | ICD-10-CM | POA: Diagnosis not present

## 2021-02-22 DIAGNOSIS — M2550 Pain in unspecified joint: Secondary | ICD-10-CM | POA: Diagnosis not present

## 2021-02-22 NOTE — Progress Notes (Signed)
MEDICARE ANNUAL WELLNESS VISIT  02/22/2021  Telephone Visit Disclaimer This Medicare AWV was conducted by telephone due to national recommendations for restrictions regarding the COVID-19 Pandemic (e.g. social distancing).  I verified, using two identifiers, that I am speaking with Bradley Graham or their authorized healthcare agent. I discussed the limitations, risks, security, and privacy concerns of performing an evaluation and management service by telephone and the potential availability of an in-person appointment in the future. The patient expressed understanding and agreed to proceed.  Location of Patient: home Location of Provider (nurse): office  Subjective:    Bradley Graham is a 50 y.o. male patient of Stacks, Cletus Gash, MD who had a Medicare Annual Wellness Visit today via telephone. Cartrell is Disabled and lives with their family. he has 0 children. he reports that he is socially active and does interact with friends/family regularly. he is not physically active and enjoys music and church.  Patient Care Team: [name], MD as PCP - General (Family Medicine) Wilfrid Lund, MD (Otolaryngology) Ophelia Shoulder, MD (Pulmonary Disease) Mercie Eon, MD (Internal Medicine) Rocco, Micheal Likens, MD as Referring Physician (Nephrology) Shea Evans [name], LCSW as Social Worker (Licensed Clinical Social Worker) Leafy Kindle, PA-C as Consulting Physician (Rheumatology)  Advanced Directives 02/22/2021 02/10/2020 02/07/2019 06/27/2016 05/06/2015 03/16/2015  Does Patient Have a Medical Advance Directive? No Yes No;Yes No Yes No  Type of Advance Directive - Healthcare Power of [place] in Chart? - - No - copy requested - No - copy requested -  Would patient like information on creating a medical advance directive? No - Guardian declined - Yes (MAU/Ambulatory/Procedural Areas - Information  given) - - No - patient declined information    Hospital Utilization Over the Past 12 Months: # of hospitalizations or ER visits: 0 # of surgeries: 0  Review of Systems    Patient reports that his overall health is unchanged compared to last year.  History obtained from chart review, the patient and mother  Patient Reported Readings (BP, Pulse, CBG, Weight, etc) none  Pain Assessment Pain : No/denies pain     Current Medications & Allergies (verified) Allergies as of 02/22/2021      Reactions   Aleve [naproxen Sodium]    Bactrim [sulfamethoxazole-trimethoprim]    Erythromycin    Heparin    Induced thrombopenia   Nsaids    Penicillins    Cortisporin [bacitra-neomycin-polymyxin-hc] Other (See Comments)   Burned ear canal per mom      Medication List       Accurate as of February 22, 2021 10:21 AM. If you have any questions, ask your nurse or doctor.        STOP taking these medications   diphenhydrAMINE 25 mg capsule Commonly known as: BENADRYL   febuxostat 40 MG tablet Commonly known as: ULORIC   Vitamin D 50 MCG (2000 UT) Caps     TAKE these medications   Accu-Chek Guide test strip Generic drug: glucose blood Test BS QID Dx E11.9   Accu-Chek Softclix Lancets lancets Test BS QID Dx E11.9   allopurinol 100 MG tablet Commonly known as: ZYLOPRIM Take 50 mg by mouth daily.   aspirin 81 MG tablet Take 81 mg by mouth daily.   blood glucose meter kit and supplies Kit Dispense based on patient and insurance preference. Use up to four times daily as directed. (FOR ICD-10 :  E11.9   Colchicine 0.6 MG Caps Take 1 capsule by mouth every other day.   docusate sodium 100 MG capsule Commonly known as: COLACE Take 100 mg by mouth daily.   famotidine 10 MG tablet Commonly known as: PEPCID Take 1 tablet (10 mg total) by mouth 2 (two) times daily.   fluticasone 50 MCG/ACT nasal spray Commonly known as: FLONASE PLACE 1 SPRAY INTO BOTH NOSTRILS 2 (TWO) TIMES  DAILY.   linagliptin 5 MG Tabs tablet Commonly known as: Tradjenta Take 1 tablet (5 mg total) by mouth daily. For diabetes   loratadine 10 MG tablet Commonly known as: CLARITIN Take 1 tablet (10 mg total) by mouth daily.   lovastatin 20 MG tablet Commonly known as: MEVACOR Take 1 tablet (20 mg total) by mouth at bedtime. Needs to be seen   olmesartan 20 MG tablet Commonly known as: Benicar Take 1 tablet (20 mg total) by mouth daily.   Vitamin D (Ergocalciferol) 1.25 MG (50000 UNIT) Caps capsule Commonly known as: DRISDOL Take 1 capsule (50,000 Units total) by mouth every 7 (seven) days.       History (reviewed): Past Medical History:  Diagnosis Date  . Atrial fibrillation ([place])   . CHF (congestive heart failure) ([place])   . CKD (chronic kidney disease)   . Club foot of both feet   . Hypertension   . Left ventricular hypertrophy   . Right ventricular hypertrophy   . Speech impediment   . Tetralogy of Fallot    s/p waterson shunt  . Vitamin D deficiency    Past Surgical History:  Procedure Laterality Date  . ABDOMINAL HERNIA REPAIR     [facility]   . ADENOIDECTOMY    . MOUTH SURGERY    . scoliosis    . VSD REPAIR    . WRIST SURGERY Left    Family History  Problem Relation Age of Onset  . Hyperlipidemia Mother   . Hyperthyroidism Mother   . Diabetes insipidus Mother   . Diabetes Father   . Hyperlipidemia Father   . Hypertension Father   . Hyperthyroidism Sister   . Cancer Maternal Grandfather 66       breast cancer  . Pernicious anemia Maternal Grandfather   . Cancer Cousin        colon cance   Social History   Socioeconomic History  . Marital status: Single    Spouse name: Not on file  . Number of children: Not on file  . Years of education: Not on file  . Highest education level: 12th grade  Occupational History  . Not on file  Tobacco Use  . Smoking status: Never Smoker  . Smokeless tobacco: Never Used  Vaping Use  . Vaping Use: Never used   Substance and Sexual Activity  . Alcohol use: No  . Drug use: No  . Sexual activity: Never  Other Topics Concern  . Not on file  Social History Narrative   Single, lives with parents.   Social Determinants of Health   Financial Resource Strain: Low Risk   . Difficulty of Paying Living Expenses: Not hard at all  Food Insecurity: No Food Insecurity  . Worried About Charity fundraiser in the Last Year: Never true  . Ran Out of Food in the Last Year: Never true  Transportation Needs: No Transportation Needs  . Lack of Transportation (Medical): No  . Lack of Transportation (Non-Medical): No  Physical Activity: Inactive  . Days of Exercise per Week:  0 days  . Minutes of Exercise per Session: 0 min  Stress: No Stress Concern Present  . Feeling of Stress : Only a little  Social Connections: Moderately Integrated  . Frequency of Communication with Friends and Family: More than three times a week  . Frequency of Social Gatherings with Friends and Family: More than three times a week  . Attends Religious Services: More than 4 times per year  . Active Member of Clubs or Organizations: Yes  . Attends Archivist Meetings: 1 to 4 times per year  . Marital Status: Never married    Activities of Daily Living In your present state of health, do you have any difficulty performing the following activities: 02/22/2021  Hearing? N  Vision? Y  Comment glasses  Difficulty concentrating or making decisions? Y  Comment some  Walking or climbing stairs? Y  Dressing or bathing? N  Doing errands, shopping? Y  Comment must be accompanied by parent, due to cerebral palsy  Preparing Food and eating ? N  Comment does not cook  Using the Toilet? N  In the past six months, have you accidently leaked urine? N  Do you have problems with loss of bowel control? N  Managing your Medications? N  Managing your Finances? Y  Comment parents pay  Housekeeping or managing your Housekeeping? N   Some recent data might be hidden    Patient Education/ Literacy How often do you need to have someone help you when you read instructions, pamphlets, or other written materials from your doctor or pharmacy?: 3 - Sometimes What is the last grade level you completed in school?: 12  Exercise Current Exercise Habits: The patient does not participate in regular exercise at present, Exercise limited by: Other - see comments (cerebral palsy)  Diet Patient reports consuming 3 meals a day and 0 snack(s) a day Patient reports that his primary diet is: Low fat Patient reports that she does have regular access to food.   Depression Screen PHQ 2/9 Scores 02/22/2021 02/03/2021 02/03/2021 01/12/2021 07/27/2020 05/19/2020 04/21/2020  PHQ - 2 Score 0 0 0 0 0 0 2  PHQ- 9 Score - - - - - - 6     Fall Risk Fall Risk  02/22/2021 02/03/2021 02/03/2021 01/12/2021 05/19/2020  Falls in the past year? 0 0 0 0 1  Number falls in past yr: - - - - 0  Injury with Fall? - - - - 1  Comment - - - - Knee pain  Risk for fall due to : - - - No Fall Risks -  Follow up - - - Falls evaluation completed -     Objective:  Mia L Fusilier seemed alert and oriented and he participated appropriately during our telephone visit.  Blood Pressure Weight BMI  BP Readings from Last 3 Encounters:  02/03/21 111/67  01/12/21 131/75  07/27/20 127/79   Wt Readings from Last 3 Encounters:  02/03/21 179 lb 12.8 oz (81.6 kg)  01/12/21 181 lb 3.2 oz (82.2 kg)  07/27/20 177 lb 8 oz (80.5 kg)   BMI Readings from Last 1 Encounters:  02/03/21 31.85 kg/m    *Unable to obtain current vital signs, weight, and BMI due to telephone visit type  Hearing/Vision  . Demarco did not seem to have difficulty with hearing/understanding during the telephone conversation . Reports that he has had a formal eye exam by an eye care professional within the past year . Reports that he has not had  a formal hearing evaluation within the past year *Unable to fully  assess hearing and vision during telephone visit type  Cognitive Function: 6CIT Screen 02/22/2021 02/10/2020  What Year? 0 points 0 points  What month? 0 points 0 points  What time? 0 points 0 points  Count back from 20 0 points 0 points  Months in reverse 4 points 4 points  Repeat phrase 0 points 2 points  Total Score 4 6   (Normal:0-7, Significant for Dysfunction: >8)  Normal Cognitive Function Screening: Yes   Immunization & Health Maintenance Record Immunization History  Administered Date(s) Administered  . Influenza Inj Mdck Quad Pf 09/19/2019  . Influenza, High Dose Seasonal PF 11/12/2018  . Influenza,inj,Quad PF,6+ Mos 09/29/2016, 08/10/2017  . Influenza-Unspecified 10/20/2014, 10/08/2015, 11/12/2018, 10/05/2020  . Janssen (J&J) SARS-COV-2 Vaccination 08/07/2020  . Moderna Sars-Covid-2 Vaccination 10/21/2020  . Tdap 02/07/2019    Health Maintenance  Topic Date Due  . COLONOSCOPY (Pts 45-77yr Insurance coverage will need to be confirmed)  02/03/2022 (Originally 04/13/2016)  . TETANUS/TDAP  02/06/2029  . INFLUENZA VACCINE  Completed  . COVID-19 Vaccine  Completed  . Hepatitis C Screening  Completed  . HIV Screening  Completed  . HPV VACCINES  Aged Out       Assessment  This is a routine wellness examination for Tavaras L Plasencia.  Health Maintenance: Due or Overdue There are no preventive care reminders to display for this patient.  Elfego L Hester does not need a referral for Community Assistance: Care Management:   no Social Work:    no Prescription Assistance:  no Nutrition/Diabetes Education:  no   Plan:  Personalized Goals Goals Addressed            This Visit's Progress   . COMPLETED: Have 3 meals a day        Personalized Health Maintenance & Screening Recommendations  up to date  Lung Cancer Screening Recommended: no (Low Dose CT Chest recommended if Age 50-80years, 30 pack-year currently smoking OR have quit w/in past 15 years) Hepatitis C  Screening recommended: no HIV Screening recommended: no  Advanced Directives: Written information was not prepared per patient's request.  Referrals & Orders No orders of the defined types were placed in this encounter.   Follow-up Plan . Follow-up with SClaretta Fraise MD as planned. . Pt has cerebral palsy and did very well with this AWV, his mother (healthcare poer of attorney) assisted only a couple       Of times. He is independent with ADL's in the home, but needs assistance when out of the home. Patients goal is  to exercise more Pt is up to date on health maintenance Declined advanced directive information. No healthcare concerns from patient or mother at this time. AVS printed and mailed.  I have personally reviewed and noted the following in the patient's chart:   . Medical and social history . Use of alcohol, tobacco or illicit drugs  . Current medications and supplements . Functional ability and status . Nutritional status . Physical activity . Advanced directives . List of other physicians . Hospitalizations, surgeries, and ER visits in previous 12 months . Vitals . Screenings to include cognitive, depression, and falls . Referrals and appointments  In addition, I have reviewed and discussed with Rondell L Moret certain preventive protocols, quality metrics, and best practice recommendations. A written personalized care plan for preventive services as well as general preventive health recommendations is available and can be mailed to the  patient at his request.      Sharee Holster  02/17/4007

## 2021-02-25 ENCOUNTER — Other Ambulatory Visit: Payer: Self-pay | Admitting: Family Medicine

## 2021-03-03 DIAGNOSIS — M79672 Pain in left foot: Secondary | ICD-10-CM | POA: Diagnosis not present

## 2021-03-03 DIAGNOSIS — N183 Chronic kidney disease, stage 3 unspecified: Secondary | ICD-10-CM | POA: Diagnosis not present

## 2021-03-03 DIAGNOSIS — Q6689 Other  specified congenital deformities of feet: Secondary | ICD-10-CM | POA: Diagnosis not present

## 2021-03-03 DIAGNOSIS — M24575 Contracture, left foot: Secondary | ICD-10-CM | POA: Diagnosis not present

## 2021-03-03 DIAGNOSIS — M19072 Primary osteoarthritis, left ankle and foot: Secondary | ICD-10-CM | POA: Diagnosis not present

## 2021-03-03 DIAGNOSIS — G801 Spastic diplegic cerebral palsy: Secondary | ICD-10-CM | POA: Diagnosis not present

## 2021-03-03 DIAGNOSIS — Q213 Tetralogy of Fallot: Secondary | ICD-10-CM | POA: Diagnosis not present

## 2021-03-03 DIAGNOSIS — L84 Corns and callosities: Secondary | ICD-10-CM | POA: Diagnosis not present

## 2021-03-03 DIAGNOSIS — M24572 Contracture, left ankle: Secondary | ICD-10-CM | POA: Diagnosis not present

## 2021-03-11 ENCOUNTER — Other Ambulatory Visit: Payer: Self-pay | Admitting: Family Medicine

## 2021-03-11 DIAGNOSIS — E785 Hyperlipidemia, unspecified: Secondary | ICD-10-CM

## 2021-03-16 ENCOUNTER — Telehealth: Payer: Medicare Other

## 2021-03-31 ENCOUNTER — Ambulatory Visit: Payer: Medicare Other

## 2021-04-01 ENCOUNTER — Other Ambulatory Visit: Payer: Self-pay | Admitting: Family Medicine

## 2021-04-01 DIAGNOSIS — N189 Chronic kidney disease, unspecified: Secondary | ICD-10-CM

## 2021-04-05 DIAGNOSIS — D631 Anemia in chronic kidney disease: Secondary | ICD-10-CM | POA: Diagnosis not present

## 2021-04-05 DIAGNOSIS — J9690 Respiratory failure, unspecified, unspecified whether with hypoxia or hypercapnia: Secondary | ICD-10-CM | POA: Diagnosis not present

## 2021-04-05 DIAGNOSIS — E785 Hyperlipidemia, unspecified: Secondary | ICD-10-CM | POA: Diagnosis not present

## 2021-04-05 DIAGNOSIS — I129 Hypertensive chronic kidney disease with stage 1 through stage 4 chronic kidney disease, or unspecified chronic kidney disease: Secondary | ICD-10-CM | POA: Diagnosis not present

## 2021-04-05 DIAGNOSIS — Z8679 Personal history of other diseases of the circulatory system: Secondary | ICD-10-CM | POA: Diagnosis not present

## 2021-04-05 DIAGNOSIS — Z981 Arthrodesis status: Secondary | ICD-10-CM | POA: Diagnosis not present

## 2021-04-05 DIAGNOSIS — N183 Chronic kidney disease, stage 3 unspecified: Secondary | ICD-10-CM | POA: Diagnosis not present

## 2021-04-05 DIAGNOSIS — Z8719 Personal history of other diseases of the digestive system: Secondary | ICD-10-CM | POA: Diagnosis not present

## 2021-04-05 DIAGNOSIS — Z8774 Personal history of (corrected) congenital malformations of heart and circulatory system: Secondary | ICD-10-CM | POA: Diagnosis not present

## 2021-04-05 DIAGNOSIS — Z79899 Other long term (current) drug therapy: Secondary | ICD-10-CM | POA: Diagnosis not present

## 2021-04-05 DIAGNOSIS — Z9889 Other specified postprocedural states: Secondary | ICD-10-CM | POA: Diagnosis not present

## 2021-04-05 DIAGNOSIS — I272 Pulmonary hypertension, unspecified: Secondary | ICD-10-CM | POA: Diagnosis not present

## 2021-04-05 DIAGNOSIS — N1832 Chronic kidney disease, stage 3b: Secondary | ICD-10-CM | POA: Diagnosis not present

## 2021-04-05 DIAGNOSIS — M109 Gout, unspecified: Secondary | ICD-10-CM | POA: Diagnosis not present

## 2021-04-05 DIAGNOSIS — Z8739 Personal history of other diseases of the musculoskeletal system and connective tissue: Secondary | ICD-10-CM | POA: Diagnosis not present

## 2021-04-07 DIAGNOSIS — N181 Chronic kidney disease, stage 1: Secondary | ICD-10-CM | POA: Diagnosis not present

## 2021-04-07 DIAGNOSIS — M1A09X0 Idiopathic chronic gout, multiple sites, without tophus (tophi): Secondary | ICD-10-CM | POA: Diagnosis not present

## 2021-04-07 DIAGNOSIS — M2550 Pain in unspecified joint: Secondary | ICD-10-CM | POA: Diagnosis not present

## 2021-04-22 ENCOUNTER — Ambulatory Visit (INDEPENDENT_AMBULATORY_CARE_PROVIDER_SITE_OTHER): Payer: Medicare Other | Admitting: Licensed Clinical Social Worker

## 2021-04-22 DIAGNOSIS — I4892 Unspecified atrial flutter: Secondary | ICD-10-CM

## 2021-04-22 DIAGNOSIS — I10 Essential (primary) hypertension: Secondary | ICD-10-CM | POA: Diagnosis not present

## 2021-04-22 DIAGNOSIS — E785 Hyperlipidemia, unspecified: Secondary | ICD-10-CM

## 2021-04-22 DIAGNOSIS — G801 Spastic diplegic cerebral palsy: Secondary | ICD-10-CM

## 2021-04-22 DIAGNOSIS — E559 Vitamin D deficiency, unspecified: Secondary | ICD-10-CM

## 2021-04-22 NOTE — Patient Instructions (Signed)
Visit Information  PATIENT GOALS: Goals Addressed            This Visit's Progress   . Protect My Health;Complete ADLs daily as able       Timeframe:  Short-Term Goal Priority:  Medium Progress: On Track Start Date:             04/22/21                Expected End Date:           07/23/21            Follow Up Date 06/02/21   Protect My Health (Patient) Complete ADLs daily, as able    Why is this important?    Screening tests can find diseases early when they are easier to treat.   Your doctor or nurse will talk with you about which tests are important for you.   Getting shots for common diseases like the flu and shingles will help prevent them.    Patient Coping Skills: Has strong support from his mother , Bryan Lade Completes ADLs independently Has hobbies her enjoys ( enjoys playing music)  Patient Deficits Hearing challenges Mobility challenges  Patient Goals:  In next 30 days: Client will attend all scheduled medical appointments Allow time for rest and relaxation for himself Talk regularly with his mother about his daily needs Take medications as prescribed. -  Follow Up Plan: LCSW to call client or his mother on 06/02/21       [name].Stefanie Hodgens MSW, LCSW Licensed Clinical Social Worker [facility] Care Management [phone]
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
## 2021-05-18 ENCOUNTER — Other Ambulatory Visit: Payer: Self-pay | Admitting: Family Medicine

## 2021-05-18 ENCOUNTER — Encounter: Payer: Self-pay | Admitting: Family Medicine

## 2021-05-18 ENCOUNTER — Ambulatory Visit (INDEPENDENT_AMBULATORY_CARE_PROVIDER_SITE_OTHER): Payer: Medicare Other | Admitting: Family Medicine

## 2021-05-18 VITALS — BP 141/78 | HR 98 | Temp 98.3°F

## 2021-05-18 DIAGNOSIS — J069 Acute upper respiratory infection, unspecified: Secondary | ICD-10-CM | POA: Diagnosis not present

## 2021-05-18 DIAGNOSIS — R0981 Nasal congestion: Secondary | ICD-10-CM | POA: Diagnosis not present

## 2021-05-18 DIAGNOSIS — R059 Cough, unspecified: Secondary | ICD-10-CM | POA: Diagnosis not present

## 2021-05-18 LAB — VERITOR FLU A/B WAIVED
Influenza A: NEGATIVE
Influenza B: NEGATIVE
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
## 2021-05-19 ENCOUNTER — Other Ambulatory Visit: Payer: Self-pay | Admitting: *Deleted

## 2021-05-19 ENCOUNTER — Telehealth: Payer: Self-pay | Admitting: Family Medicine

## 2021-05-19 LAB — SARS-COV-2, NAA 2 DAY TAT

## 2021-05-19 LAB — NOVEL CORONAVIRUS, NAA: SARS-CoV-2, NAA: NOT DETECTED

## 2021-05-19 NOTE — Telephone Encounter (Signed)
Pts mom called to get test results. Explained to mom that per provider notes, pt tested neg for covid. Mom wants to know what flu and strep results are?  Please advise and call mom [phone]

## 2021-05-20 DIAGNOSIS — R1010 Upper abdominal pain, unspecified: Secondary | ICD-10-CM | POA: Diagnosis not present

## 2021-05-20 DIAGNOSIS — E7800 Pure hypercholesterolemia, unspecified: Secondary | ICD-10-CM | POA: Diagnosis not present

## 2021-05-20 DIAGNOSIS — R0789 Other chest pain: Secondary | ICD-10-CM | POA: Diagnosis not present

## 2021-05-20 DIAGNOSIS — S20212A Contusion of left front wall of thorax, initial encounter: Secondary | ICD-10-CM | POA: Diagnosis not present

## 2021-05-20 DIAGNOSIS — K8020 Calculus of gallbladder without cholecystitis without obstruction: Secondary | ICD-10-CM | POA: Diagnosis not present

## 2021-05-20 DIAGNOSIS — R0781 Pleurodynia: Secondary | ICD-10-CM | POA: Diagnosis not present

## 2021-05-20 DIAGNOSIS — R079 Chest pain, unspecified: Secondary | ICD-10-CM | POA: Diagnosis not present

## 2021-05-20 DIAGNOSIS — I10 Essential (primary) hypertension: Secondary | ICD-10-CM | POA: Diagnosis not present

## 2021-05-20 DIAGNOSIS — R109 Unspecified abdominal pain: Secondary | ICD-10-CM | POA: Diagnosis not present

## 2021-05-20 DIAGNOSIS — S20219A Contusion of unspecified front wall of thorax, initial encounter: Secondary | ICD-10-CM | POA: Diagnosis not present

## 2021-05-20 DIAGNOSIS — R072 Precordial pain: Secondary | ICD-10-CM | POA: Diagnosis not present

## 2021-05-20 NOTE — Telephone Encounter (Signed)
Flu was negative. We did not test for strep since his symptoms were not consistent with strep.

## 2021-05-20 NOTE — Telephone Encounter (Signed)
Mom aware and verbalizes understanding per dpr.

## 2021-05-21 DIAGNOSIS — R079 Chest pain, unspecified: Secondary | ICD-10-CM | POA: Diagnosis not present

## 2021-05-21 DIAGNOSIS — R109 Unspecified abdominal pain: Secondary | ICD-10-CM | POA: Diagnosis not present

## 2021-05-26 ENCOUNTER — Other Ambulatory Visit: Payer: Self-pay

## 2021-05-26 ENCOUNTER — Encounter: Payer: Self-pay | Admitting: Physician Assistant

## 2021-05-26 ENCOUNTER — Ambulatory Visit (INDEPENDENT_AMBULATORY_CARE_PROVIDER_SITE_OTHER): Payer: Medicare Other | Admitting: Physician Assistant

## 2021-05-26 VITALS — BP 117/77 | HR 96 | Temp 98.0°F | Ht 63.0 in | Wt 177.0 lb

## 2021-05-26 DIAGNOSIS — S20219A Contusion of unspecified front wall of thorax, initial encounter: Secondary | ICD-10-CM | POA: Diagnosis not present
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
## 2021-05-26 NOTE — Patient Instructions (Signed)
Rib Contusion A rib contusion is a deep bruise on the rib area. Contusions are the result of a blunt trauma that causes bleeding and injury to the tissues under the skin. A rib contusion may involve bruising of the ribs and of the skin and muscles in the area. The skin over the contusion may turn blue, purple, or yellow. Minor injuries result in a painless contusion. More severe contusions may be painfuland swollen for a few weeks. What are the causes? This condition is usually caused by a hard, direct hit to an area of the body.This often occurs while playing contact sports. What are the signs or symptoms? Symptoms of this condition include: Swelling and redness of the injured area. Discoloration of the injured area. Tenderness and soreness of the injured area. Pain with or without movement. Pain when breathing in. How is this diagnosed? This condition may be diagnosed based on: Your symptoms and medical history. A physical exam. Imaging tests--such as an X-ray, CT scan, or MRI--to determine if there were internal injuries or broken bones (fractures). How is this treated? This condition may be treated with: Rest. This is often the best treatment for a rib contusion. Ice packs. This reduces swelling and inflammation. Deep-breathing exercises. These may be recommended to reduce the risk for lung collapse and pneumonia. Medicines. Over-the-counter or prescription medicines may be given to control pain. Injection of a numbing medicine around the nerve near your injury (nerve block). Follow these instructions at home: Medicines Take over-the-counter and prescription medicines only as told by your health care provider. Ask your health care provider if the medicine prescribed to you: Requires you to avoid driving or using machinery. Can cause constipation. You may need to take these actions to prevent or treat constipation: Drink enough fluid to keep your urine pale yellow. Take  over-the-counter or prescription medicines. Eat foods that are high in fiber, such as beans, whole grains, and fresh fruits and vegetables. Limit foods that are high in fat and processed sugars, such as fried or sweet foods. Managing pain, stiffness, and swelling If directed, put ice on the injured area. To do this: Put ice in a plastic bag. Place a towel between your skin and the bag. Leave the ice on for 20 minutes, 2-3 times a day. Remove the ice if your skin turns bright red. This is very important. If you cannot feel pain, heat, or cold, you have a greater risk of damage to the area.  Activity Rest the injured area. Avoid strenuous activity and any activities or movements that cause pain. Be careful during activities, and avoid bumping the injured area. Do not lift anything that is heavier than 5 lb (2.3 kg), or the limit that you are told, until your health care provider says that it is safe. General instructions  Do not use any products that contain nicotine or tobacco, such as cigarettes, e-cigarettes, and chewing tobacco. These can delay healing. If you need help quitting, ask your health care provider. Do deep-breathing exercises as told by your health care provider. If you were given an incentive spirometer, use it every 1-2 hours while you are awake, or as recommended by your health care provider. This device measures how well you are filling your lungs with each breath. Keep all follow-up visits. This is important.  Contact a health care provider if you have: Increased bruising or swelling. Pain that is not controlled with treatment. A fever. Get help right away if you: Have difficulty breathing or shortness  of breath. Develop a continual cough, or you cough up thick or bloody mucus from your lungs (sputum). Feel nauseous or you vomit. Have pain in your abdomen. These symptoms may represent a serious problem that is an emergency. Do not wait to see if the symptoms will go  away. Get medical help right away. Call your local emergency services (911 in the [place] Do not drive yourself to the hospital. Summary A rib contusion is a deep bruise on your rib area. Contusions are the result of a blunt trauma that causes bleeding and injury to the tissues under the skin. The skin over the contusion may turn blue, purple, or yellow. Minor injuries may cause a painless contusion. More severe contusions may be painful and swollen for a few weeks. Rest the injured area. Avoid strenuous activity and any activities or movements that cause pain. This information is not intended to replace advice given to you by your health care provider. Make sure you discuss any questions you have with your healthcare provider. Document Revised: 02/26/2020 Document Reviewed: 02/26/2020 Elsevier Patient Education  [place].

## 2021-06-02 ENCOUNTER — Telehealth: Payer: Medicare Other

## 2021-06-16 DIAGNOSIS — H6123 Impacted cerumen, bilateral: Secondary | ICD-10-CM | POA: Diagnosis not present

## 2021-07-03 ENCOUNTER — Other Ambulatory Visit: Payer: Self-pay | Admitting: Family Medicine

## 2021-07-08 DIAGNOSIS — M2550 Pain in unspecified joint: Secondary | ICD-10-CM | POA: Diagnosis not present

## 2021-07-08 DIAGNOSIS — M1A09X0 Idiopathic chronic gout, multiple sites, without tophus (tophi): Secondary | ICD-10-CM | POA: Diagnosis not present

## 2021-07-08 DIAGNOSIS — N181 Chronic kidney disease, stage 1: Secondary | ICD-10-CM | POA: Diagnosis not present

## 2021-07-15 ENCOUNTER — Telehealth: Payer: Medicare Other

## 2021-07-22 DIAGNOSIS — I4510 Unspecified right bundle-branch block: Secondary | ICD-10-CM | POA: Diagnosis not present

## 2021-07-22 DIAGNOSIS — N183 Chronic kidney disease, stage 3 unspecified: Secondary | ICD-10-CM | POA: Diagnosis not present

## 2021-07-22 DIAGNOSIS — E7800 Pure hypercholesterolemia, unspecified: Secondary | ICD-10-CM | POA: Diagnosis not present

## 2021-07-22 DIAGNOSIS — Z952 Presence of prosthetic heart valve: Secondary | ICD-10-CM | POA: Diagnosis not present

## 2021-07-22 DIAGNOSIS — M109 Gout, unspecified: Secondary | ICD-10-CM | POA: Diagnosis not present

## 2021-07-22 DIAGNOSIS — Q213 Tetralogy of Fallot: Secondary | ICD-10-CM | POA: Diagnosis not present

## 2021-07-22 DIAGNOSIS — I440 Atrioventricular block, first degree: Secondary | ICD-10-CM | POA: Diagnosis not present

## 2021-07-22 DIAGNOSIS — Z8774 Personal history of (corrected) congenital malformations of heart and circulatory system: Secondary | ICD-10-CM | POA: Diagnosis not present

## 2021-07-22 DIAGNOSIS — I517 Cardiomegaly: Secondary | ICD-10-CM | POA: Diagnosis not present

## 2021-07-22 DIAGNOSIS — E1122 Type 2 diabetes mellitus with diabetic chronic kidney disease: Secondary | ICD-10-CM | POA: Diagnosis not present

## 2021-07-23 DIAGNOSIS — Z952 Presence of prosthetic heart valve: Secondary | ICD-10-CM | POA: Insufficient documentation

## 2021-08-10 DIAGNOSIS — M1A09X0 Idiopathic chronic gout, multiple sites, without tophus (tophi): Secondary | ICD-10-CM | POA: Diagnosis not present

## 2021-08-25 ENCOUNTER — Ambulatory Visit (INDEPENDENT_AMBULATORY_CARE_PROVIDER_SITE_OTHER): Payer: Medicare Other | Admitting: Licensed Clinical Social Worker

## 2021-08-25 DIAGNOSIS — N189 Chronic kidney disease, unspecified: Secondary | ICD-10-CM

## 2021-08-25 DIAGNOSIS — G801 Spastic diplegic cerebral palsy: Secondary | ICD-10-CM

## 2021-08-25 DIAGNOSIS — I4892 Unspecified atrial flutter: Secondary | ICD-10-CM

## 2021-08-25 DIAGNOSIS — E785 Hyperlipidemia, unspecified: Secondary | ICD-10-CM

## 2021-08-25 DIAGNOSIS — I10 Essential (primary) hypertension: Secondary | ICD-10-CM

## 2021-08-25 DIAGNOSIS — E559 Vitamin D deficiency, unspecified: Secondary | ICD-10-CM

## 2021-08-25 NOTE — Patient Instructions (Signed)
Visit Information  PATIENT GOALS:  Goals Addressed             This Visit's Progress    Manage My Emotions       Protect My Health;Complete ADLs daily as able       Timeframe:  Short-Term Goal Priority:  Medium Progress: On Track Start Date:            08/25/21                Expected End Date:          11/19/21            Follow Up Date 10/08/21 at 10:00 AM   Protect My Health (Patient) Complete ADLs daily, as able    Why is this important?   Screening tests can find diseases early when they are easier to treat.  Your doctor or nurse will talk with you about which tests are important for you.  Getting shots for common diseases like the flu and shingles will help prevent them.    Patient Coping Skills: Has strong support from his mother , Arta Baz Completes ADLs independently Has hobbies her enjoys ( enjoys playing music)  Patient Deficits Hearing challenges Mobility challenges  Patient Goals:  In next 30 days: Client will attend all scheduled medical appointments Allow time for rest and relaxation for himself Talk regularly with his mother about his daily needs Take medications as prescribed. -  Follow Up Plan: LCSW to call client or his mother on 10/08/21 at 10:00 AM to assess client needs.      [name].Jonel Weldon MSW, LCSW Licensed Clinical Social Worker [facility] Care Management [phone]
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
## 2021-09-03 DIAGNOSIS — E785 Hyperlipidemia, unspecified: Secondary | ICD-10-CM | POA: Diagnosis not present

## 2021-09-03 DIAGNOSIS — N189 Chronic kidney disease, unspecified: Secondary | ICD-10-CM | POA: Diagnosis not present

## 2021-09-03 DIAGNOSIS — I10 Essential (primary) hypertension: Secondary | ICD-10-CM | POA: Diagnosis not present

## 2021-09-05 ENCOUNTER — Other Ambulatory Visit: Payer: Self-pay | Admitting: Family Medicine

## 2021-10-08 ENCOUNTER — Ambulatory Visit (INDEPENDENT_AMBULATORY_CARE_PROVIDER_SITE_OTHER): Payer: Medicare Other | Admitting: Licensed Clinical Social Worker

## 2021-10-08 DIAGNOSIS — M1A09X0 Idiopathic chronic gout, multiple sites, without tophus (tophi): Secondary | ICD-10-CM | POA: Diagnosis not present

## 2021-10-08 DIAGNOSIS — M2550 Pain in unspecified joint: Secondary | ICD-10-CM | POA: Diagnosis not present

## 2021-10-08 DIAGNOSIS — G801 Spastic diplegic cerebral palsy: Secondary | ICD-10-CM

## 2021-10-08 DIAGNOSIS — N189 Chronic kidney disease, unspecified: Secondary | ICD-10-CM

## 2021-10-08 DIAGNOSIS — E785 Hyperlipidemia, unspecified: Secondary | ICD-10-CM

## 2021-10-08 DIAGNOSIS — N181 Chronic kidney disease, stage 1: Secondary | ICD-10-CM | POA: Diagnosis not present

## 2021-10-08 DIAGNOSIS — I10 Essential (primary) hypertension: Secondary | ICD-10-CM

## 2021-10-08 DIAGNOSIS — E559 Vitamin D deficiency, unspecified: Secondary | ICD-10-CM

## 2021-10-08 DIAGNOSIS — I4892 Unspecified atrial flutter: Secondary | ICD-10-CM

## 2021-10-08 NOTE — Patient Instructions (Addendum)
Visit Information  Patient Goals:  Protect My Health (Patient). Complete ADLs daily, as able  Timeframe:  Short-Term Goal Priority:  Medium Progress: On Track Start Date:           10/08/21                Expected End Date:         01/06/22           Follow Up Date  12/08/21 at 10:00 AM   Protect My Health (Patient) Complete ADLs daily, as able    Why is this important?   Screening tests can find diseases early when they are easier to treat.  Your doctor or nurse will talk with you about which tests are important for you.  Getting shots for common diseases like the flu and shingles will help prevent them.    Patient Coping Skills: Has strong support from his mother , Estill Llerena Completes ADLs independently Has hobbies her enjoys ( enjoys playing music)  Patient Deficits Hearing challenges Mobility challenges  Patient Goals:  In next 30 days: Client will attend all scheduled medical appointments Allow time for rest and relaxation for himself Talk regularly with his mother about his daily needs Take medications as prescribed. -  Follow Up Plan: LCSW to call client or his mother on 12/08/21 at 10:00 AM to assess client needs.    [name].Maryrose Colvin MSW, LCSW Licensed Clinical Social Worker [facility] Care Management [phone]
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
## 2021-10-14 DIAGNOSIS — N1832 Chronic kidney disease, stage 3b: Secondary | ICD-10-CM | POA: Diagnosis not present

## 2021-10-14 DIAGNOSIS — N170 Acute kidney failure with tubular necrosis: Secondary | ICD-10-CM | POA: Diagnosis not present

## 2021-10-14 DIAGNOSIS — I509 Heart failure, unspecified: Secondary | ICD-10-CM | POA: Diagnosis not present

## 2021-10-14 DIAGNOSIS — Z8774 Personal history of (corrected) congenital malformations of heart and circulatory system: Secondary | ICD-10-CM | POA: Diagnosis not present

## 2021-10-14 DIAGNOSIS — N183 Chronic kidney disease, stage 3 unspecified: Secondary | ICD-10-CM | POA: Diagnosis not present

## 2021-10-14 DIAGNOSIS — E785 Hyperlipidemia, unspecified: Secondary | ICD-10-CM | POA: Diagnosis not present

## 2021-10-14 DIAGNOSIS — I132 Hypertensive heart and chronic kidney disease with heart failure and with stage 5 chronic kidney disease, or end stage renal disease: Secondary | ICD-10-CM | POA: Diagnosis not present

## 2021-10-14 DIAGNOSIS — M109 Gout, unspecified: Secondary | ICD-10-CM | POA: Diagnosis not present

## 2021-10-14 DIAGNOSIS — I272 Pulmonary hypertension, unspecified: Secondary | ICD-10-CM | POA: Diagnosis not present

## 2021-10-14 DIAGNOSIS — D631 Anemia in chronic kidney disease: Secondary | ICD-10-CM | POA: Diagnosis not present

## 2021-10-14 DIAGNOSIS — I129 Hypertensive chronic kidney disease with stage 1 through stage 4 chronic kidney disease, or unspecified chronic kidney disease: Secondary | ICD-10-CM | POA: Diagnosis not present

## 2021-10-14 DIAGNOSIS — J9690 Respiratory failure, unspecified, unspecified whether with hypoxia or hypercapnia: Secondary | ICD-10-CM | POA: Diagnosis not present

## 2021-10-17 ENCOUNTER — Other Ambulatory Visit: Payer: Self-pay | Admitting: Family Medicine

## 2021-10-17 DIAGNOSIS — E785 Hyperlipidemia, unspecified: Secondary | ICD-10-CM

## 2021-10-25 ENCOUNTER — Ambulatory Visit (INDEPENDENT_AMBULATORY_CARE_PROVIDER_SITE_OTHER): Payer: Medicare Other | Admitting: Nurse Practitioner

## 2021-10-25 ENCOUNTER — Encounter: Payer: Self-pay | Admitting: Nurse Practitioner

## 2021-10-25 DIAGNOSIS — H66004 Acute suppurative otitis media without spontaneous rupture of ear drum, recurrent, right ear: Secondary | ICD-10-CM | POA: Diagnosis not present

## 2021-10-25 MED ORDER — CEFDINIR 300 MG PO CAPS: 300.0000 mg | ORAL_CAPSULE | Freq: Two times a day (BID) | ORAL | 0 refills | Status: DC

## 2021-10-25 NOTE — Progress Notes (Signed)
   Virtual Visit  Note Due to COVID-19 pandemic this visit was conducted virtually. This visit type was conducted due to national recommendations for restrictions regarding the COVID-19 Pandemic (e.g. social distancing, sheltering in place) in an effort to limit this patient's exposure and mitigate transmission in our community. All issues noted in this document were discussed and addressed.  A physical exam was not performed with this format.  I [name] on 10/25/21 at 1:30 by telephone and verified that I am speaking with the correct person using two identifiers. Bradley Graham is currently located at home and his mom is currently with him during visit. The provider, Mary-Margaret Hassell Done, FNP is located in their office at time of visit.  I discussed the limitations, risks, security and privacy concerns of performing an evaluation and management service by telephone and the availability of in person appointments. I also discussed with the patient that there may be a patient responsible charge related to this service. The patient expressed understanding and agreed to proceed.   History and Present Illness:  Patient mom speaks for him due  to developmental delays. He has cough and congestion for 3 days. Usually when he gets a cold he gets an ear infection. C/o right ear pain. Possible drainage. He cant tolerate ear drops. He sees an ENT doctor every 6 months.     Review of Systems  Constitutional:  Negative for chills, fever and malaise/fatigue.  HENT:  Positive for congestion and ear pain.   Respiratory:  Negative for cough.   Musculoskeletal:  Negative for myalgias.  Neurological:  Negative for dizziness and headaches.    Observations/Objective: Alert and oriented- answers all questions appropriately No distress   Assessment and Plan: Bradley Graham in today with chief complaint of ear pain  1. Recurrent acute suppurative otitis media of right ear without  spontaneous rupture of tympanic membrane Motrin or tylenol for pain or fever Meds ordered this encounter  Medications   cefdinir (OMNICEF) 300 MG capsule    Sig: Take 1 capsule (300 mg total) by mouth 2 (two) times daily. 1 po BID    Dispense:  20 capsule    Refill:  0    Order Specific Question:   Supervising Provider    Answer:   Caryl Pina A [[id number]]      Follow Up Instructions: prn    I discussed the assessment and treatment plan with the patient. The patient was provided an opportunity to ask questions and all were answered. The patient agreed with the plan and demonstrated an understanding of the instructions.   The patient was advised to call back or seek an in-person evaluation if the symptoms worsen or if the condition fails to improve as anticipated.  The above assessment and management plan was discussed with the patient. The patient verbalized understanding of and has agreed to the management plan. Patient is aware to call the clinic if symptoms persist or worsen. Patient is aware when to return to the clinic for a follow-up visit. Patient educated on when it is appropriate to go to the emergency department.   Time call ended:  1:42  I provided 11 minutes of  non face-to-face time during this encounter.    Mary-Margaret Hassell Done, FNP

## 2021-11-13 ENCOUNTER — Other Ambulatory Visit: Payer: Self-pay | Admitting: Family Medicine

## 2021-11-14 ENCOUNTER — Other Ambulatory Visit: Payer: Self-pay | Admitting: Family Medicine

## 2021-11-14 DIAGNOSIS — E785 Hyperlipidemia, unspecified: Secondary | ICD-10-CM

## 2021-11-15 NOTE — Telephone Encounter (Signed)
Patient has appt with [facility] 12-13 for med refills

## 2021-11-15 NOTE — Telephone Encounter (Signed)
MMM NTBS 30 days given 10/18/21

## 2021-11-16 ENCOUNTER — Ambulatory Visit (INDEPENDENT_AMBULATORY_CARE_PROVIDER_SITE_OTHER): Payer: Medicare Other | Admitting: Family Medicine

## 2021-11-16 ENCOUNTER — Encounter: Payer: Self-pay | Admitting: Family Medicine

## 2021-11-16 VITALS — BP 123/66 | HR 97 | Temp 96.9°F | Ht 63.0 in | Wt 175.0 lb

## 2021-11-16 DIAGNOSIS — M1A0710 Idiopathic chronic gout, right ankle and foot, without tophus (tophi): Secondary | ICD-10-CM | POA: Diagnosis not present

## 2021-11-16 DIAGNOSIS — T380X5A Adverse effect of glucocorticoids and synthetic analogues, initial encounter: Secondary | ICD-10-CM

## 2021-11-16 DIAGNOSIS — N189 Chronic kidney disease, unspecified: Secondary | ICD-10-CM | POA: Diagnosis not present

## 2021-11-16 DIAGNOSIS — I10 Essential (primary) hypertension: Secondary | ICD-10-CM | POA: Diagnosis not present

## 2021-11-16 DIAGNOSIS — E785 Hyperlipidemia, unspecified: Secondary | ICD-10-CM | POA: Diagnosis not present

## 2021-11-16 DIAGNOSIS — E099 Drug or chemical induced diabetes mellitus without complications: Secondary | ICD-10-CM

## 2021-11-16 DIAGNOSIS — E559 Vitamin D deficiency, unspecified: Secondary | ICD-10-CM | POA: Diagnosis not present

## 2021-11-16 LAB — BAYER DCA HB A1C WAIVED: HB A1C (BAYER DCA - WAIVED): 6.4 % — ABNORMAL HIGH (ref 4.8–5.6)

## 2021-11-16 MED ORDER — OLMESARTAN MEDOXOMIL 20 MG PO TABS
20.0000 mg | ORAL_TABLET | Freq: Every day | ORAL | 3 refills | Status: DC
Start: 2021-11-16 — End: 2022-10-03

## 2021-11-16 MED ORDER — LOVASTATIN 20 MG PO TABS: 20.0000 mg | ORAL_TABLET | Freq: Every day | ORAL | 3 refills | Status: DC

## 2021-11-16 NOTE — Progress Notes (Signed)
° °Subjective:  °Patient ID: Bradley Graham, male    DOB: 08/29/1971  Age: 50 y.o. MRN: [id number] ° °CC: Medical Management of Chronic Issues ° ° °HPI °Bradley Graham presents forFollow-up of diabetes. Patient checks blood sugar at home.  ° 100-120s fasting and not checking postprandial °Patient denies symptoms such as polyuria, polydipsia, excessive hunger, nausea °No significant hypoglycemic spells noted. °Medications reviewed. Pt reports taking them regularly without complication/adverse reaction being reported today.  °Checking feet daily. °Also concerned for uric acid. No recent gout flare though. Needs level drawn. ° °Followed by renal. Mom states kidney function is stable.  ° ° presents for  follow-up of hypertension. Patient has no history of headache chest pain or shortness of breath or recent cough. Patient also denies symptoms of TIA such as focal numbness or weakness. Patient denies side effects from medication. States taking it regularly. ° ° in for follow-up of elevated cholesterol. Doing well without complaints on current medication. Denies side effects of statin including myalgia and arthralgia and nausea. Currently no chest pain, shortness of breath or other cardiovascular related symptoms noted. ° ° °History °Bradley Graham has a past medical history of Atrial fibrillation (HCC), CHF (congestive heart failure) (HCC), CKD (chronic kidney disease), Club foot of both feet, Hypertension, Left ventricular hypertrophy, Right ventricular hypertrophy, Speech impediment, Tetralogy of Fallot, and Vitamin D deficiency.  ° °He has a past surgical history that includes VSD repair; scoliosis; Adenoidectomy; Mouth surgery; Wrist surgery (Left); and Abdominal hernia repair.  ° °His family history includes Cancer in his cousin; Cancer (age of onset: 70) in his maternal grandfather; Diabetes in his father; Diabetes insipidus in his mother; Hyperlipidemia in his father and mother; Hypertension in his father; Hyperthyroidism in  his mother and sister; Pernicious anemia in his maternal grandfather.He reports that he has never smoked. He has never used smokeless tobacco. He reports that he does not drink alcohol and does not use drugs. ° °Current Outpatient Medications on File Prior to Visit  °Medication Sig Dispense Refill  ° Accu-Chek Softclix Lancets lancets Test BS QID Dx E11.9 400 each 3  ° allopurinol (ZYLOPRIM) 100 MG tablet Take 50 mg by mouth daily.    ° aspirin 81 MG tablet Take 81 mg by mouth daily.    ° blood glucose meter kit and supplies KIT Dispense based on patient and insurance preference. Use up to four times daily as directed. (FOR ICD-10 : E11.9 1 each 11  ° Colchicine 0.6 MG CAPS Take 1 capsule by mouth every other day.    ° docusate sodium (COLACE) 100 MG capsule Take 100 mg by mouth daily.    ° famotidine (PEPCID) 10 MG tablet Take 1 tablet (10 mg total) by mouth 2 (two) times daily. 180 tablet 3  ° fluticasone (FLONASE) 50 MCG/ACT nasal spray PLACE 1 SPRAY INTO BOTH NOSTRILS 2 (TWO) TIMES DAILY 48 mL 1  ° glucose blood (ACCU-CHEK GUIDE) test strip TEST UP TO 4 TIMES DAILY AS DIRECTED. E11.9 400 strip 0  ° loratadine (CLARITIN) 10 MG tablet Take 1 tablet (10 mg total) by mouth daily. 90 tablet 1  ° linagliptin (TRADJENTA) 5 MG TABS tablet Take 1 tablet (5 mg total) by mouth daily. For diabetes (Patient not taking: Reported on 05/26/2021) 30 tablet 5  ° °No current facility-administered medications on file prior to visit.  ° ° °ROS °Review of Systems  °Constitutional:  Negative for fever.  °Respiratory:  Negative for shortness of breath.   °Cardiovascular:  Negative   for chest pain.  °Musculoskeletal:  Negative for arthralgias.  °Skin:  Negative for rash.  ° °Objective:  °BP 123/66    Pulse 97    Temp (!) 96.9 °F (36.1 °C)    Ht 5' 3" (1.6 m)    Wt 175 lb (79.4 kg)    SpO2 96%    BMI 31.00 kg/m²  ° °BP Readings from Last 3 Encounters:  °11/16/21 123/66  °05/26/21 117/77  °05/18/21 (!) 141/78  ° ° °Wt Readings from Last 3  Encounters:  °11/16/21 175 lb (79.4 kg)  °05/26/21 177 lb (80.3 kg)  °02/03/21 179 lb 12.8 oz (81.6 kg)  ° ° ° °Physical Exam °Vitals reviewed.  °Constitutional:   °   Appearance: He is well-developed.  °HENT:  °   Head: Normocephalic and atraumatic.  °   Right Ear: External ear normal.  °   Left Ear: External ear normal.  °   Mouth/Throat:  °   Pharynx: No oropharyngeal exudate or posterior oropharyngeal erythema.  °Eyes:  °   Pupils: Pupils are equal, round, and reactive to light.  °Cardiovascular:  °   Rate and Rhythm: Normal rate and regular rhythm.  °   Heart sounds: No murmur heard. °Pulmonary:  °   Effort: No respiratory distress.  °   Breath sounds: Normal breath sounds.  °Musculoskeletal:  °   Cervical back: Normal range of motion and neck supple.  °Neurological:  °   Mental Status: He is alert and oriented to person, place, and time.  ° ° ° ° °Assessment & Plan:  ° °Bradley Graham was seen today for medical management of chronic issues. ° °Diagnoses and all orders for this visit: ° °Hyperlipidemia, unspecified hyperlipidemia type °-     Lipid panel °-     lovastatin (MEVACOR) 20 MG tablet; Take 1 tablet (20 mg total) by mouth at bedtime. (NEEDS TO BE SEEN BEFORE NEXT REFILL) ° °Essential hypertension, benign °-     CBC with Differential/Platelet °-     CMP14+EGFR ° °Steroid-induced diabetes mellitus, initial encounter (HCC) °-     Bayer DCA Hb A1c Waived °-     CBC with Differential/Platelet °-     CMP14+EGFR ° °Vitamin D deficiency °-     VITAMIN D 25 Hydroxy (Vit-D Deficiency, Fractures) ° °Chronic kidney disease, unspecified CKD stage °-     olmesartan (BENICAR) 20 MG tablet; Take 1 tablet (20 mg total) by mouth daily. ° °Idiopathic chronic gout of right foot without tophus °-     Uric acid ° ° ° ° °I have discontinued Bradley Graham's cefdinir. I have also changed his olmesartan. Additionally, I am having him maintain his docusate sodium, aspirin, loratadine, famotidine, linagliptin, blood glucose meter kit and  supplies, Accu-Chek Softclix Lancets, allopurinol, Colchicine, fluticasone, Accu-Chek Guide, and lovastatin. ° °Meds ordered this encounter  °Medications  ° lovastatin (MEVACOR) 20 MG tablet  °  Sig: Take 1 tablet (20 mg total) by mouth at bedtime. (NEEDS TO BE SEEN BEFORE NEXT REFILL)  °  Dispense:  90 tablet  °  Refill:  3  ° olmesartan (BENICAR) 20 MG tablet  °  Sig: Take 1 tablet (20 mg total) by mouth daily.  °  Dispense:  90 tablet  °  Refill:  3  ° ° ° °Follow-up: Return in about 3 months (around 02/14/2022). ° °Warren Stacks, M.D. ° °

## 2021-11-17 LAB — LIPID PANEL
Chol/HDL Ratio: 2.9 ratio (ref 0.0–5.0)
Cholesterol, Total: 168 mg/dL (ref 100–199)
HDL: 58 mg/dL (ref >39)
LDL Chol Calc (NIH): 91 mg/dL (ref 0–99)
Triglycerides: 104 mg/dL (ref 0–149)
VLDL Cholesterol Cal: 19 mg/dL (ref 5–40)

## 2021-11-17 LAB — CBC WITH DIFFERENTIAL/PLATELET
Basophils Absolute: 0.1 10*3/uL (ref 0.0–0.2)
Basos: 1 %
EOS (ABSOLUTE): 1 10*3/uL — ABNORMAL HIGH (ref 0.0–0.4)
Eos: 15 %
Hematocrit: 39.6 % (ref 37.5–51.0)
Hemoglobin: 13.4 g/dL (ref 13.0–17.7)
Immature Grans (Abs): 0 10*3/uL (ref 0.0–0.1)
Immature Granulocytes: 0 %
Lymphocytes Absolute: 1.3 10*3/uL (ref 0.7–3.1)
Lymphs: 19 %
MCH: 32 pg (ref 26.6–33.0)
MCHC: 33.8 g/dL (ref 31.5–35.7)
MCV: 95 fL (ref 79–97)
Monocytes Absolute: 0.6 10*3/uL (ref 0.1–0.9)
Monocytes: 9 %
Neutrophils Absolute: 3.8 10*3/uL (ref 1.4–7.0)
Neutrophils: 56 %
Platelets: 113 10*3/uL — ABNORMAL LOW (ref 150–450)
RBC: 4.19 x10E6/uL (ref 4.14–5.80)
RDW: 13 % (ref 11.6–15.4)
WBC: 6.9 10*3/uL (ref 3.4–10.8)

## 2021-11-17 LAB — CMP14+EGFR
ALT: 16 IU/L (ref 0–44)
AST: 22 IU/L (ref 0–40)
Albumin/Globulin Ratio: 3.2 — ABNORMAL HIGH (ref 1.2–2.2)
Albumin: 4.8 g/dL (ref 4.0–5.0)
Alkaline Phosphatase: 94 IU/L (ref 44–121)
BUN/Creatinine Ratio: 12 (ref 9–20)
BUN: 24 mg/dL (ref 6–24)
Bilirubin Total: 0.5 mg/dL (ref 0.0–1.2)
CO2: 21 mmol/L (ref 20–29)
Calcium: 8.9 mg/dL (ref 8.7–10.2)
Chloride: 100 mmol/L (ref 96–106)
Creatinine, Ser: 1.96 mg/dL — ABNORMAL HIGH (ref 0.76–1.27)
Globulin, Total: 1.5 g/dL (ref 1.5–4.5)
Glucose: 138 mg/dL — ABNORMAL HIGH (ref 70–99)
Potassium: 4.6 mmol/L (ref 3.5–5.2)
Sodium: 137 mmol/L (ref 134–144)
Total Protein: 6.3 g/dL (ref 6.0–8.5)
eGFR: 41 mL/min/{1.73_m2} — ABNORMAL LOW (ref >59)

## 2021-11-17 LAB — VITAMIN D 25 HYDROXY (VIT D DEFICIENCY, FRACTURES): Vit D, 25-Hydroxy: 63.8 ng/mL (ref 30.0–100.0)

## 2021-11-24 NOTE — Progress Notes (Signed)
Hello Kennith,  Your lab result is normal and/or stable.Some minor variations that are not significant are commonly marked abnormal, but do not represent any medical problem for you.  Best regards, [name], M.D.

## 2021-12-08 ENCOUNTER — Telehealth: Payer: Medicare Other

## 2021-12-27 ENCOUNTER — Ambulatory Visit (INDEPENDENT_AMBULATORY_CARE_PROVIDER_SITE_OTHER): Payer: Medicare Other | Admitting: Licensed Clinical Social Worker

## 2021-12-27 DIAGNOSIS — I4892 Unspecified atrial flutter: Secondary | ICD-10-CM

## 2021-12-27 DIAGNOSIS — I10 Essential (primary) hypertension: Secondary | ICD-10-CM

## 2021-12-27 DIAGNOSIS — G801 Spastic diplegic cerebral palsy: Secondary | ICD-10-CM

## 2021-12-27 DIAGNOSIS — E559 Vitamin D deficiency, unspecified: Secondary | ICD-10-CM

## 2021-12-27 DIAGNOSIS — E785 Hyperlipidemia, unspecified: Secondary | ICD-10-CM

## 2021-12-27 DIAGNOSIS — N189 Chronic kidney disease, unspecified: Secondary | ICD-10-CM

## 2021-12-27 NOTE — Chronic Care Management (AMB) (Signed)
Chronic Care Management    Clinical Social Work Note  12/27/2021 Name: Bradley Graham MRN: [id number] DOB: [birth year]  Bradley Graham is a 50 y.o. year old male who is a primary care patient of Stacks, Cletus Gash, MD. The CCM team was consulted to assist the patient with chronic disease management and/or care coordination needs related to: [employer] .   Engaged with patient / mother of patient , Bradley Graham , by telephone for follow up visit in response to provider referral for social work chronic care management and care coordination services.   Consent to Services:  The patient was given information about Chronic Care Management services, agreed to services, and gave verbal consent prior to initiation of services.  Please see initial visit note for detailed documentation.   Patient agreed to services and consent obtained.   Assessment: Review of patient past medical history, allergies, medications, and health status, including review of relevant consultants reports was performed today as part of a comprehensive evaluation and provision of chronic care management and care coordination services.     SDOH (Social Determinants of Health) assessments and interventions performed:  SDOH Interventions    Flowsheet Row Most Recent Value  SDOH Interventions   Stress Interventions Other (Comment)  [client has hearing challenges. client has stress related to managing medical needs]  Depression Interventions/Treatment  --  [informed Bradley Graham, mother of client, of LCSW support for client and of RNCM support for client]        Advanced Directives Status: See [employer] application for related entries.  CCM Care Plan  Allergies  Allergen Reactions   Aleve [Naproxen Sodium]    Bactrim [Sulfamethoxazole-Trimethoprim]    Erythromycin    Heparin     Induced thrombopenia   Nsaids    Penicillins    Cortisporin [Bacitra-Neomycin-Polymyxin-Hc] Other (See Comments)    Burned ear canal per mom     Outpatient Encounter Medications as of 12/27/2021  Medication Sig Note   Accu-Chek Softclix Lancets lancets Test BS QID Dx E11.9    allopurinol (ZYLOPRIM) 100 MG tablet Take 50 mg by mouth daily.    aspirin 81 MG tablet Take 81 mg by mouth daily.    blood glucose meter kit and supplies KIT Dispense based on patient and insurance preference. Use up to four times daily as directed. (FOR ICD-10 : E11.9    Colchicine 0.6 MG CAPS Take 1 capsule by mouth every other day.    docusate sodium (COLACE) 100 MG capsule Take 100 mg by mouth daily. 07/19/2017: PRN   famotidine (PEPCID) 10 MG tablet Take 1 tablet (10 mg total) by mouth 2 (two) times daily.    fluticasone (FLONASE) 50 MCG/ACT nasal spray PLACE 1 SPRAY INTO BOTH NOSTRILS 2 (TWO) TIMES DAILY    glucose blood (ACCU-CHEK GUIDE) test strip TEST UP TO 4 TIMES DAILY AS DIRECTED. E11.9    linagliptin (TRADJENTA) 5 MG TABS tablet Take 1 tablet (5 mg total) by mouth daily. For diabetes (Patient not taking: Reported on 05/26/2021)    loratadine (CLARITIN) 10 MG tablet Take 1 tablet (10 mg total) by mouth daily.    lovastatin (MEVACOR) 20 MG tablet Take 1 tablet (20 mg total) by mouth at bedtime. (NEEDS TO BE SEEN BEFORE NEXT REFILL)    olmesartan (BENICAR) 20 MG tablet Take 1 tablet (20 mg total) by mouth daily.    No facility-administered encounter medications on file as of 12/27/2021.    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 Monitor hearing needs of client  Care Plan : LCSW Care Plan  Updates made by [name], LCSW since 12/27/2021 12:00 AM     Problem: Coping Skills (General Plan of Care)      Goal: Coping Skills Enhanced:Complete ADLs daily , as able   Start Date: 12/27/2021  Expected End Date: 03/23/2022  This Visit's Progress: On track  Recent Progress: On track  Priority: Medium  Note:   Current barriers:   Patient in need of assistance with connecting to community resources for possible help with daily ADLs and other daily needs Patient is unable to independently navigate community resource options without care coordination support Hearing issues Some pain issues  Clinical Goals:  LCSW to communicate with client or his mother in next 30 days to discuss community resources of possible help to client in completing ADLs or other daily activities LCSW to talk with client or his mother in next 30 days about hearing issues of client and about mobility issues of client Client or his mother to communicate in next 30 days with RNCM to discuss nursing needs of client  Clinical Interventions:  Collaboration with Bradley Fraise, MD regarding development and update of comprehensive plan of care as evidenced by provider attestation and co-signature Discussed with Bradley Graham, mother of client, client pain issues and client appetite issues.  Discussed with Bradley Graham vision needs of client. Client wears glasses to help with vision. Reviewed with Bradley Graham client upcoming medical appointments. She said he has an appointment in February of 2023 with a Rheumatologist. .   Discussed hearing issues of client Reviewed relaxation techniques of client (rides stationary bike,  enjoys playing music on mandolin and on guitar) Client enjoys playing music at church  Encouraged client or Bradley Graham to call RNCM as needed for nursing support for client Discussed mood status of client. Bradley Graham feels that client has a stable mood.  He has not appeared anxious or depressed. Again, he enjoys daily activities and enjoys playing music on mandolin. Discussed CCM support for client with Bradley Graham Reviewed client completion of ADLs with Bradley Graham. She said client completes ADLs well. She said he walks well and does not use a device to help him walk  Patient Coping Skills: Has strong support from his mother , Bradley Graham Completes ADLs independently Has hobbies her enjoys ( enjoys playing music)  Patient Deficits Hearing challenges Mobility challenges  Patient Goals:  In next 30 days: Client will attend all scheduled medical appointments Allow time for rest and relaxation for himself Talk regularly with his mother about his daily needs Take medications as prescribed. -  Follow Up Plan: LCSW to call client or his mother on 02/21/22 at 1:00 PM to assess client needs      [name].Muna Demers MSW, LCSW Licensed Holiday representative [facility] [phone]

## 2021-12-27 NOTE — Patient Instructions (Addendum)
Visit Information  Patient Goals:  Protect My Health (Patient). Complete ADLs daily, as able  Timeframe:  Short-Term Goal Priority:  Medium Progress: On Track Start Date:           12/27/21               Expected End Date:         03/22/22           Follow Up Date  02/21/22 at 1;00 PM   Protect My Health (Patient) Complete ADLs daily, as able    Why is this important?   Screening tests can find diseases early when they are easier to treat.  Your doctor or nurse will talk with you about which tests are important for you.  Getting shots for common diseases like the flu and shingles will help prevent them.    Patient Coping Skills: Has strong support from his mother , Bradley Graham Completes ADLs independently Has hobbies her enjoys ( enjoys playing music)  Patient Deficits Hearing challenges Mobility challenges  Patient Goals:  In next 30 days: Client will attend all scheduled medical appointments Allow time for rest and relaxation for himself Talk regularly with his mother about his daily needs Take medications as prescribed. -  Follow Up Plan: LCSW to call client or his mother on 02/21/22 at 1:00 PM to assess client needs.    [name].Bradley Graham MSW, [place] [employer] representative [facility] [phone]

## 2021-12-30 DIAGNOSIS — H6123 Impacted cerumen, bilateral: Secondary | ICD-10-CM | POA: Diagnosis not present

## 2022-01-04 DIAGNOSIS — E785 Hyperlipidemia, unspecified: Secondary | ICD-10-CM

## 2022-01-04 DIAGNOSIS — I10 Essential (primary) hypertension: Secondary | ICD-10-CM

## 2022-01-04 DIAGNOSIS — N189 Chronic kidney disease, unspecified: Secondary | ICD-10-CM

## 2022-01-10 DIAGNOSIS — N181 Chronic kidney disease, stage 1: Secondary | ICD-10-CM | POA: Diagnosis not present

## 2022-01-10 DIAGNOSIS — M2550 Pain in unspecified joint: Secondary | ICD-10-CM | POA: Diagnosis not present

## 2022-01-10 DIAGNOSIS — M1A09X0 Idiopathic chronic gout, multiple sites, without tophus (tophi): Secondary | ICD-10-CM | POA: Diagnosis not present

## 2022-02-01 ENCOUNTER — Ambulatory Visit (INDEPENDENT_AMBULATORY_CARE_PROVIDER_SITE_OTHER): Payer: Medicare Other | Admitting: Nurse Practitioner

## 2022-02-01 ENCOUNTER — Encounter: Payer: Self-pay | Admitting: Nurse Practitioner

## 2022-02-01 VITALS — BP 113/70 | HR 90 | Temp 98.2°F | Ht 63.0 in | Wt 173.0 lb

## 2022-02-01 DIAGNOSIS — H6501 Acute serous otitis media, right ear: Secondary | ICD-10-CM

## 2022-02-01 MED ORDER — DOXYCYCLINE HYCLATE 100 MG PO TABS
100.0000 mg | ORAL_TABLET | Freq: Two times a day (BID) | ORAL | 0 refills | Status: DC
Start: 2022-02-01 — End: 2022-06-08

## 2022-02-01 NOTE — Patient Instructions (Signed)
Otitis Media With Effusion, Adult Otitis media with effusion (OME) is inflammation and fluid (effusion) in the middle ear without having an ear infection. The middle ear is the space behind the eardrum. The middle ear is connected to the back of the throat by a narrow tube (eustachian tube). Normally the eustachian tube drains fluid out of the middle ear. A swollen eustachian tube can become blocked and cause fluid to collect in the middle ear. OME often goes away without treatment. Sometimes OME can lead to hearing problems and recurrent acute ear infections (acute otitis media). These conditions may require treatment. What are the causes? OME is caused by a blocked eustachian tube. This can result from: Allergies. Upper respiratory infections. Enlarged adenoids. The adenoids are areas of soft tissue located high in the back of the throat, behind the nose and the roof of the mouth. They are part of the body's natural defense system (immune system). Rapid changes in pressure, like when an airplane is descending or during scuba diving. In some cases, the cause of this condition is not known. What are the signs or symptoms? Common symptoms of this condition include: A feeling of fullness in your ear. Decreased hearing in the affected ear. Fluid draining into the ear canal. Pain in the ear. In some cases, there are no symptoms. How is this diagnosed? A health care provider can diagnose OME based on signs and symptoms of the condition. Your provider will also do a physical exam to check for fluid behind the eardrum. During the exam, your health care provider will use an instrument called an otoscope to look in your ear. Your health care provider may do other tests, such as: A hearing test. A tympanogram. This is a test that shows how well the eardrum moves in response to air pressure in the ear canal. It provides a graph for your health care provider to review. A pneumatic otoscopy. This is a test  to check how your eardrum moves in response to changes in pressure. It is done by squeezing a small amount of air into the ear. How is this treated? Treatment for OME depends on the cause of the condition and the severity of symptoms. The first step is often waiting to see if the fluid drains on its own in a few weeks. Home care treatment may include: Over-the-counter pain relievers. A warm, moist cloth placed over the ear. Severe cases may require a procedure to insert tubes in the ears (tympanostomy tubes) to drain the fluid. Follow these instructions at home: Take over-the-counter and prescription medicines only as told by your health care provider. Keep all follow-up visits. Contact a health care provider if: You have pain that gets worse. Hearing in your affected ear gets worse. You have fluid draining from your ear canal. You have dizziness. You develop a fever. Get help right away if: You develop a severe headache. You completely lose hearing in the affected ear. You have bleeding from your ear canal. You have sudden and severe pain in your ear. These symptoms may represent a serious problem that is an emergency. Do not wait to see if the symptoms will go away. Get medical help right away. Call your local emergency services (911 in the [place] Do not drive yourself to the hospital. Summary Otitis media with effusion (OME) is inflammation and fluid (effusion) in the middle ear without having an ear infection. A swollen eustachian tube can become blocked and cause fluid to collect in the middle ear.  Treatment for OME depends on the cause of the condition and the severity of symptoms. Many times, treatment is not needed because the fluid drains on its own in a few weeks. Sometimes OME can lead to hearing problems and recurrent acute ear infections (acute otitis media), which may require treatment. This information is not intended to replace advice given to you by your health care  provider. Make sure you discuss any questions you have with your health care provider. Document Revised: 03/18/2021 Document Reviewed: 03/18/2021 Elsevier Patient Education  [place].

## 2022-02-01 NOTE — Progress Notes (Signed)
Acute Office Visit  Subjective:    Patient ID: Bradley Graham, male    DOB: Oct 29, 1971, 51 y.o.   MRN: [id number]  Chief Complaint  Patient presents with   right ear drainage(pus)    Otalgia  There is pain in the right ear. This is a new problem. Episode onset: In the past 4 days. The problem occurs constantly. The problem has been unchanged. There has been no fever. The pain is moderate. Associated symptoms include ear discharge. Pertinent negatives include no coughing, headaches or hearing loss. He has tried nothing for the symptoms. The treatment provided no relief.    Past Medical History:  Diagnosis Date   Atrial fibrillation (HCC)    CHF (congestive heart failure) (HCC)    CKD (chronic kidney disease)    Club foot of both feet    Hypertension    Left ventricular hypertrophy    Right ventricular hypertrophy    Speech impediment    Tetralogy of Fallot    s/p waterson shunt   Vitamin D deficiency     Past Surgical History:  Procedure Laterality Date   ABDOMINAL HERNIA REPAIR     [facility]    ADENOIDECTOMY     MOUTH SURGERY     scoliosis     VSD REPAIR     WRIST SURGERY Left     Family History  Problem Relation Age of Onset   Hyperlipidemia Mother    Hyperthyroidism Mother    Diabetes insipidus Mother    Diabetes Father    Hyperlipidemia Father    Hypertension Father    Hyperthyroidism Sister    Cancer Maternal Grandfather 25       breast cancer   Pernicious anemia Maternal Grandfather    Cancer Cousin        colon cance    Social History   Socioeconomic History   Marital status: Single    Spouse name: Not on file   Number of children: Not on file   Years of education: Not on file   Highest education level: 12th grade  Occupational History   Not on file  Tobacco Use   Smoking status: Never   Smokeless tobacco: Never  Vaping Use   Vaping Use: Never used  Substance and Sexual Activity   Alcohol use: No   Drug use: No   Sexual activity: Never   Other Topics Concern   Not on file  Social History Narrative   Single, lives with parents.   Social Determinants of Health   Financial Resource Strain: Low Risk    Difficulty of Paying Living Expenses: Not hard at all  Food Insecurity: No Food Insecurity   Worried About Charity fundraiser in the Last Year: Never true   [place] in the Last Year: Never true  Transportation Needs: No Transportation Needs   Lack of Transportation (Medical): No   Lack of Transportation (Non-Medical): No  Physical Activity: Inactive   Days of Exercise per Week: 0 days   Minutes of Exercise per Session: 0 min  Stress: Stress Concern Present   Feeling of Stress : To some extent  Social Connections: Moderately Integrated   Frequency of Communication with Friends and Family: More than three times a week   Frequency of Social Gatherings with Friends and Family: More than three times a week   Attends Religious Services: More than 4 times per year   Active Member of [employer] or Organizations: Yes   Attends [employer]  or Organization Meetings: 1 to 4 times per year   Marital Status: Never married  Intimate Partner Violence: Not on file    Outpatient Medications Prior to Visit  Medication Sig Dispense Refill   Accu-Chek Softclix Lancets lancets Test BS QID Dx E11.9 400 each 3   allopurinol (ZYLOPRIM) 100 MG tablet Take 50 mg by mouth daily.     aspirin 81 MG tablet Take 81 mg by mouth daily.     blood glucose meter kit and supplies KIT Dispense based on patient and insurance preference. Use up to four times daily as directed. (FOR ICD-10 : E11.9 1 each 11   Cholecalciferol 25 MCG (1000 UT) capsule 1 tablet     Colchicine 0.6 MG CAPS Take 1 capsule by mouth every other day.     docusate sodium (COLACE) 100 MG capsule Take 100 mg by mouth daily.     famotidine (PEPCID) 10 MG tablet Take 1 tablet (10 mg total) by mouth 2 (two) times daily. 180 tablet 3   fexofenadine (ALLEGRA) 180 MG tablet Take by mouth.      fluticasone (FLONASE) 50 MCG/ACT nasal spray PLACE 1 SPRAY INTO BOTH NOSTRILS 2 (TWO) TIMES DAILY 48 mL 1   glucose blood (ACCU-CHEK GUIDE) test strip TEST UP TO 4 TIMES DAILY AS DIRECTED. E11.9 400 strip 0   linagliptin (TRADJENTA) 5 MG TABS tablet Take 1 tablet (5 mg total) by mouth daily. For diabetes 30 tablet 5   loratadine (CLARITIN) 10 MG tablet Take 1 tablet (10 mg total) by mouth daily. 90 tablet 1   lovastatin (MEVACOR) 20 MG tablet Take 1 tablet (20 mg total) by mouth at bedtime. (NEEDS TO BE SEEN BEFORE NEXT REFILL) 90 tablet 3   olmesartan (BENICAR) 20 MG tablet Take 1 tablet (20 mg total) by mouth daily. 90 tablet 3   No facility-administered medications prior to visit.    Allergies  Allergen Reactions   Aleve [Naproxen Sodium]    Bactrim [Sulfamethoxazole-Trimethoprim]    Erythromycin    Heparin     Induced thrombopenia   Nsaids    Penicillins    Cortisporin [Bacitra-Neomycin-Polymyxin-Hc] Other (See Comments)    Burned ear canal per mom    Review of Systems  Constitutional: Negative.   HENT:  Positive for ear discharge and ear pain. Negative for hearing loss.   Eyes: Negative.   Respiratory: Negative.  Negative for cough.   Neurological:  Negative for headaches.  All other systems reviewed and are negative.     Objective:    Physical Exam Vitals and nursing note reviewed.  Constitutional:      Appearance: Normal appearance.  HENT:     Right Ear: External ear normal. Drainage and tenderness present. There is no impacted cerumen.     Left Ear: External ear normal. Tenderness present. There is no impacted cerumen.     Nose: Congestion present.     Mouth/Throat:     Mouth: Mucous membranes are moist.     Pharynx: Oropharynx is clear.  Eyes:     Conjunctiva/sclera: Conjunctivae normal.  Cardiovascular:     Rate and Rhythm: Normal rate and regular rhythm.     Pulses: Normal pulses.     Heart sounds: Normal heart sounds.  Pulmonary:     Effort:  Pulmonary effort is normal.     Breath sounds: Normal breath sounds.  Abdominal:     General: Bowel sounds are normal.  Musculoskeletal:        General:  Normal range of motion.  Skin:    General: Skin is warm.     Findings: No rash.  Neurological:     Mental Status: He is alert and oriented to person, place, and time.    BP 113/70    Pulse 90    Temp 98.2 F (36.8 C)    Ht '5\' 3"'  (1.6 m)    Wt 173 lb (78.5 kg)    SpO2 93%    BMI 30.65 kg/m  Wt Readings from Last 3 Encounters:  02/01/22 173 lb (78.5 kg)  11/16/21 175 lb (79.4 kg)  05/26/21 177 lb (80.3 kg)    Health Maintenance Due  Topic Date Due   COVID-19 Vaccine (3 - Booster for Janssen series) 12/16/2020    There are no preventive care reminders to display for this patient.   No results found for: TSH Lab Results  Component Value Date   WBC 6.9 11/16/2021   HGB 13.4 11/16/2021   HCT 39.6 11/16/2021   MCV 95 11/16/2021   PLT 113 (L) 11/16/2021   Lab Results  Component Value Date   NA 137 11/16/2021   K 4.6 11/16/2021   CO2 21 11/16/2021   GLUCOSE 138 (H) 11/16/2021   BUN 24 11/16/2021   CREATININE 1.96 (H) 11/16/2021   BILITOT 0.5 11/16/2021   ALKPHOS 94 11/16/2021   AST 22 11/16/2021   ALT 16 11/16/2021   PROT 6.3 11/16/2021   ALBUMIN 4.8 11/16/2021   CALCIUM 8.9 11/16/2021   EGFR 41 (L) 11/16/2021   Lab Results  Component Value Date   CHOL 168 11/16/2021   Lab Results  Component Value Date   HDL 58 11/16/2021   Lab Results  Component Value Date   LDLCALC 91 11/16/2021   Lab Results  Component Value Date   TRIG 104 11/16/2021   Lab Results  Component Value Date   CHOLHDL 2.9 11/16/2021   Lab Results  Component Value Date   HGBA1C 6.4 (H) 11/16/2021       Assessment & Plan:  Patient presents with right ear pain and drainage in the past few days.  Symptoms not well controlled. -Doxycycline 100 mg tablet by mouth twice daily for 7 days. -Tylenol for pain as needed. -Patient knows  to follow-up with worsening or unresolved symptoms. -Education provided to patient printed handouts given. -Rx sent to pharmacy. Problem List Items Addressed This Visit   None Visit Diagnoses     Right acute serous otitis media, recurrence not specified    -  Primary   Relevant Medications   doxycycline (VIBRA-TABS) 100 MG tablet        Meds ordered this encounter  Medications   doxycycline (VIBRA-TABS) 100 MG tablet    Sig: Take 1 tablet (100 mg total) by mouth 2 (two) times daily.    Dispense:  14 tablet    Refill:  0    Order Specific Question:   Supervising Provider    Answer:   [name] [[id number]]     Ivy Lynn, NP

## 2022-02-10 ENCOUNTER — Other Ambulatory Visit: Payer: Self-pay | Admitting: Family Medicine

## 2022-02-16 ENCOUNTER — Encounter: Payer: Self-pay | Admitting: Family Medicine

## 2022-02-16 ENCOUNTER — Ambulatory Visit (INDEPENDENT_AMBULATORY_CARE_PROVIDER_SITE_OTHER): Payer: Medicare Other | Admitting: Family Medicine

## 2022-02-16 ENCOUNTER — Other Ambulatory Visit: Payer: Self-pay | Admitting: Family Medicine

## 2022-02-16 VITALS — BP 133/79 | HR 99 | Temp 97.3°F | Ht 63.0 in | Wt 172.6 lb

## 2022-02-16 DIAGNOSIS — E785 Hyperlipidemia, unspecified: Secondary | ICD-10-CM

## 2022-02-16 DIAGNOSIS — I10 Essential (primary) hypertension: Secondary | ICD-10-CM | POA: Diagnosis not present

## 2022-02-16 DIAGNOSIS — R7309 Other abnormal glucose: Secondary | ICD-10-CM | POA: Diagnosis not present

## 2022-02-16 LAB — BAYER DCA HB A1C WAIVED: HB A1C (BAYER DCA - WAIVED): 6.3 % — ABNORMAL HIGH (ref 4.8–5.6)

## 2022-02-16 NOTE — Progress Notes (Signed)
? ?Subjective:  ?Patient ID: Bradley Graham, male    DOB: May 02, 1971  Age: 51 y.o. MRN: [id number] ? ?CC: Medical Management of Chronic Issues ? ? ?HPI ?Bradley Graham presents forFollow-up of diabetes. Patient checks blood sugar at home.  ? 90 -130 fasting. ?Patient denies symptoms such as polyuria, polydipsia, excessive hunger, nausea ?No significant hypoglycemic spells noted. ?Medications reviewed. Pt reports taking them regularly without complication/adverse reaction being reported today.  ?Checking feet daily. ? ? ?Cards planning TEE  & Cath soon. Seeing Dr. Jimmye Graham at [facility]. Had an echo 1 year ago that showed good EF. ? ?History ?Bradley Graham has a past medical history of Atrial fibrillation ([place]), CHF (congestive heart failure) ([place]), CKD (chronic kidney disease), Club foot of both feet, Hypertension, Left ventricular hypertrophy, Right ventricular hypertrophy, Speech impediment, Tetralogy of Fallot, and Vitamin D deficiency.  ? ?Bradley Graham has a past surgical history that includes VSD repair; scoliosis; Adenoidectomy; Mouth surgery; Wrist surgery (Left); and Abdominal hernia repair.  ? ?His family history includes Cancer in his cousin; Cancer (age of onset: 62) in his maternal grandfather; Diabetes in his father; Diabetes insipidus in his mother; Hyperlipidemia in his father and mother; Hypertension in his father; Hyperthyroidism in his mother and sister; Pernicious anemia in his maternal grandfather.Bradley Graham reports that Bradley Graham has never smoked. Bradley Graham has never used smokeless tobacco. Bradley Graham reports that Bradley Graham does not drink alcohol and does not use drugs. ? ?Current Outpatient Medications on File Prior to Visit  ?Medication Sig Dispense Refill  ? ACCU-CHEK GUIDE test strip TEST UP TO 4 TIMES DAILY AS DIRECTED. E11.9 400 strip 0  ? Accu-Chek Softclix Lancets lancets Test BS QID Dx E11.9 400 each 3  ? allopurinol (ZYLOPRIM) 100 MG tablet Take 50 mg by mouth daily.    ? aspirin 81 MG tablet Take 81 mg by mouth daily.    ? blood glucose meter  kit and supplies KIT Dispense based on patient and insurance preference. Use up to four times daily as directed. (FOR ICD-10 : E11.9 1 each 11  ? Cholecalciferol 25 MCG (1000 UT) capsule 1 tablet    ? Colchicine 0.6 MG CAPS Take 1 capsule by mouth every other day.    ? docusate sodium (COLACE) 100 MG capsule Take 100 mg by mouth daily.    ? doxycycline (VIBRA-TABS) 100 MG tablet Take 1 tablet (100 mg total) by mouth 2 (two) times daily. 14 tablet 0  ? famotidine (PEPCID) 10 MG tablet Take 1 tablet (10 mg total) by mouth 2 (two) times daily. 180 tablet 3  ? fexofenadine (ALLEGRA) 180 MG tablet Take by mouth.    ? fluticasone (FLONASE) 50 MCG/ACT nasal spray PLACE 1 SPRAY INTO BOTH NOSTRILS 2 (TWO) TIMES DAILY 48 mL 1  ? linagliptin (TRADJENTA) 5 MG TABS tablet Take 1 tablet (5 mg total) by mouth daily. For diabetes 30 tablet 5  ? loratadine (CLARITIN) 10 MG tablet Take 1 tablet (10 mg total) by mouth daily. 90 tablet 1  ? lovastatin (MEVACOR) 20 MG tablet Take 1 tablet (20 mg total) by mouth at bedtime. (NEEDS TO BE SEEN BEFORE NEXT REFILL) 90 tablet 3  ? olmesartan (BENICAR) 20 MG tablet Take 1 tablet (20 mg total) by mouth daily. 90 tablet 3  ? ?No current facility-administered medications on file prior to visit.  ? ? ?ROS ?Review of Systems  ?Constitutional:  Negative for fever.  ?Respiratory:  Negative for shortness of breath.   ?Cardiovascular:  Negative for chest pain.  ?  Musculoskeletal:  Negative for arthralgias.  ?Skin:  Negative for rash.  ? ?Objective:  ?BP 133/79   Pulse 99   Temp (!) 97.3 ?F (36.3 ?C)   Ht '5\' 3"'  (1.6 m)   Wt 172 lb 9.6 oz (78.3 kg)   SpO2 98%   BMI 30.57 kg/m?  ? ?BP Readings from Last 3 Encounters:  ?02/16/22 133/79  ?02/01/22 113/70  ?11/16/21 123/66  ? ? ?Wt Readings from Last 3 Encounters:  ?02/16/22 172 lb 9.6 oz (78.3 kg)  ?02/01/22 173 lb (78.5 kg)  ?11/16/21 175 lb (79.4 kg)  ? ? ? ?Physical Exam ?Vitals reviewed.  ?Constitutional:   ?   Appearance: Bradley Graham is well-developed.   ?HENT:  ?   Head: Normocephalic and atraumatic.  ?   Right Ear: External ear normal.  ?   Left Ear: External ear normal.  ?   Mouth/Throat:  ?   Pharynx: No oropharyngeal exudate or posterior oropharyngeal erythema.  ?Eyes:  ?   Pupils: Pupils are equal, round, and reactive to light.  ?Cardiovascular:  ?   Rate and Rhythm: Normal rate and regular rhythm.  ?   Heart sounds: No murmur heard. ?Pulmonary:  ?   Effort: No respiratory distress.  ?   Breath sounds: Normal breath sounds.  ?Musculoskeletal:  ?   Cervical back: Normal range of motion and neck supple.  ?Neurological:  ?   Mental Status: Bradley Graham is alert and oriented to person, place, and time.  ? ? ? ? ?Assessment & Plan:  ? ?Bradley Graham was seen today for medical management of chronic issues. ? ?Diagnoses and all orders for this visit: ? ?Essential hypertension, benign ?-     CBC with Differential/Platelet ?-     CMP14+EGFR ? ?Hyperlipidemia, unspecified hyperlipidemia type ?-     Lipid panel ? ?Elevated hemoglobin A1c ?-     Bayer DCA Hb A1c Waived ? ? ? ? ? ?I am having Bradley Graham maintain his docusate sodium, aspirin, loratadine, famotidine, linagliptin, blood glucose meter kit and supplies, Accu-Chek Softclix Lancets, allopurinol, Colchicine, fluticasone, lovastatin, olmesartan, Cholecalciferol, fexofenadine, doxycycline, and Accu-Chek Guide. ? ?No orders of the defined types were placed in this encounter. ? ? ? ?Follow-up: Return in about 3 months (around 05/19/2022). ? ?[name], M.D. ? ?

## 2022-02-17 LAB — CMP14+EGFR
ALT: 10 IU/L (ref 0–44)
AST: 17 IU/L (ref 0–40)
Albumin/Globulin Ratio: 2.3 — ABNORMAL HIGH (ref 1.2–2.2)
Albumin: 4.4 g/dL (ref 4.0–5.0)
Alkaline Phosphatase: 107 IU/L (ref 44–121)
BUN/Creatinine Ratio: 14 (ref 9–20)
BUN: 29 mg/dL — ABNORMAL HIGH (ref 6–24)
Bilirubin Total: 0.3 mg/dL (ref 0.0–1.2)
CO2: 24 mmol/L (ref 20–29)
Calcium: 9.5 mg/dL (ref 8.7–10.2)
Chloride: 104 mmol/L (ref 96–106)
Creatinine, Ser: 2.06 mg/dL — ABNORMAL HIGH (ref 0.76–1.27)
Globulin, Total: 1.9 g/dL (ref 1.5–4.5)
Glucose: 104 mg/dL — ABNORMAL HIGH (ref 70–99)
Potassium: 4.6 mmol/L (ref 3.5–5.2)
Sodium: 143 mmol/L (ref 134–144)
Total Protein: 6.3 g/dL (ref 6.0–8.5)
eGFR: 39 mL/min/{1.73_m2} — ABNORMAL LOW (ref >59)

## 2022-02-17 LAB — LIPID PANEL
Chol/HDL Ratio: 2.5 ratio (ref 0.0–5.0)
Cholesterol, Total: 163 mg/dL (ref 100–199)
HDL: 64 mg/dL (ref >39)
LDL Chol Calc (NIH): 86 mg/dL (ref 0–99)
Triglycerides: 66 mg/dL (ref 0–149)
VLDL Cholesterol Cal: 13 mg/dL (ref 5–40)

## 2022-02-17 LAB — CBC WITH DIFFERENTIAL/PLATELET
Basophils Absolute: 0.1 10*3/uL (ref 0.0–0.2)
Basos: 1 %
EOS (ABSOLUTE): 0.7 10*3/uL — ABNORMAL HIGH (ref 0.0–0.4)
Eos: 9 %
Hematocrit: 39.6 % (ref 37.5–51.0)
Hemoglobin: 13.5 g/dL (ref 13.0–17.7)
Immature Grans (Abs): 0 10*3/uL (ref 0.0–0.1)
Immature Granulocytes: 0 %
Lymphocytes Absolute: 1.3 10*3/uL (ref 0.7–3.1)
Lymphs: 18 %
MCH: 32.1 pg (ref 26.6–33.0)
MCHC: 34.1 g/dL (ref 31.5–35.7)
MCV: 94 fL (ref 79–97)
Monocytes Absolute: 0.6 10*3/uL (ref 0.1–0.9)
Monocytes: 8 %
Neutrophils Absolute: 4.8 10*3/uL (ref 1.4–7.0)
Neutrophils: 64 %
Platelets: 129 10*3/uL — ABNORMAL LOW (ref 150–450)
RBC: 4.21 x10E6/uL (ref 4.14–5.80)
RDW: 12.9 % (ref 11.6–15.4)
WBC: 7.5 10*3/uL (ref 3.4–10.8)

## 2022-02-21 ENCOUNTER — Ambulatory Visit (INDEPENDENT_AMBULATORY_CARE_PROVIDER_SITE_OTHER): Payer: Medicare Other | Admitting: Licensed Clinical Social Worker

## 2022-02-21 DIAGNOSIS — I10 Essential (primary) hypertension: Secondary | ICD-10-CM

## 2022-02-21 DIAGNOSIS — E785 Hyperlipidemia, unspecified: Secondary | ICD-10-CM

## 2022-02-21 DIAGNOSIS — E559 Vitamin D deficiency, unspecified: Secondary | ICD-10-CM

## 2022-02-21 DIAGNOSIS — N189 Chronic kidney disease, unspecified: Secondary | ICD-10-CM

## 2022-02-21 DIAGNOSIS — I4892 Unspecified atrial flutter: Secondary | ICD-10-CM

## 2022-02-21 DIAGNOSIS — G801 Spastic diplegic cerebral palsy: Secondary | ICD-10-CM

## 2022-02-21 NOTE — Chronic Care Management (AMB) (Signed)
?Chronic Care Management  ? ? Clinical Social Work Note ? ?02/21/2022 ?Name: Bradley Graham MRN: [id number] DOB: [birth year] ? ?Bradley Graham is a 51 y.o. year old male who is a primary care patient of Stacks, Cletus Gash, MD. The CCM team was consulted to assist the patient with chronic disease management and/or care coordination needs related to: [employer] .  ? ?Engaged with patient / mother of patient,  [name], by telephone for follow up visit in response to provider referral for social work chronic care management and care coordination services.  ? ?Consent to Services:  ?The patient was given information about Chronic Care Management services, agreed to services, and gave verbal consent prior to initiation of services.  Please see initial visit note for detailed documentation.  ? ?Patient agreed to services and consent obtained.  ? ?Assessment: Review of patient past medical history, allergies, medications, and health status, including review of relevant consultants reports was performed today as part of a comprehensive evaluation and provision of chronic care management and care coordination services.    ? ?SDOH (Social Determinants of Health) assessments and interventions performed:  ?SDOH Interventions   ? ?Flowsheet Row Most Recent Value  ?SDOH Interventions   ?Stress Interventions Other (Comment)  [client has some stress related to managing medical needs]  ?Depression Interventions/Treatment  --  [informed [name], mother of client, of LCSW support for client and of RNCM support for client]  ? ?  ?  ? ?Advanced Directives Status: See [employer] application for related entries. ? ?CCM Care Plan ? ?Allergies  ?Allergen Reactions  ? Aleve [Naproxen Sodium]   ? Bactrim [Sulfamethoxazole-Trimethoprim]   ? Erythromycin   ? Heparin   ?  Induced thrombopenia  ? Nsaids   ? Penicillins   ? Cortisporin [Bacitra-Neomycin-Polymyxin-Hc] Other (See Comments)  ?  Burned ear canal per mom  ? ? ?Outpatient Encounter  Medications as of 02/21/2022  ?Medication Sig Note  ? ACCU-CHEK GUIDE test strip TEST UP TO 4 TIMES DAILY AS DIRECTED. E11.9   ? Accu-Chek Softclix Lancets lancets Test BS QID Dx E11.9   ? allopurinol (ZYLOPRIM) 100 MG tablet Take 50 mg by mouth daily.   ? aspirin 81 MG tablet Take 81 mg by mouth daily.   ? blood glucose meter kit and supplies KIT Dispense based on patient and insurance preference. Use up to four times daily as directed. (FOR ICD-10 : E11.9   ? Cholecalciferol 25 MCG (1000 UT) capsule 1 tablet   ? Colchicine 0.6 MG CAPS Take 1 capsule by mouth every other day. 02/01/2022: PRN  ? docusate sodium (COLACE) 100 MG capsule Take 100 mg by mouth daily. 07/19/2017: PRN  ? doxycycline (VIBRA-TABS) 100 MG tablet Take 1 tablet (100 mg total) by mouth 2 (two) times daily.   ? famotidine (PEPCID) 10 MG tablet Take 1 tablet (10 mg total) by mouth 2 (two) times daily.   ? fexofenadine (ALLEGRA) 180 MG tablet Take by mouth.   ? fluticasone (FLONASE) 50 MCG/ACT nasal spray PLACE 1 SPRAY INTO BOTH NOSTRILS 2 (TWO) TIMES DAILY   ? linagliptin (TRADJENTA) 5 MG TABS tablet Take 1 tablet (5 mg total) by mouth daily. For diabetes   ? loratadine (CLARITIN) 10 MG tablet Take 1 tablet (10 mg total) by mouth daily.   ? lovastatin (MEVACOR) 20 MG tablet Take 1 tablet (20 mg total) by mouth at bedtime. (NEEDS TO BE SEEN BEFORE NEXT REFILL)   ? olmesartan (BENICAR) 20 MG tablet Take  1 tablet (20 mg total) by mouth daily.   ? ?No facility-administered encounter medications on file as of 02/21/2022.  ? ? ?Patient Active Problem List  ? Diagnosis Date Noted  ? Essential hypertension, benign 05/02/2017  ? Hyperlipidemia 05/02/2017  ? Posterior tibialis tendon insufficiency 02/23/2015  ? Overweight (BMI 25.0-29.9) 10/20/2014  ? Incisional hernia, without obstruction or gangrene 02/28/2014  ? 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 Vitamin D deficiency 06/25/2013  ? CKD (chronic kidney disease) 06/25/2013  ?  S/P TOF (tetralogy of Fallot) repair 06/25/2013  ? Speech impediment secondary to organic lesion 06/25/2013  ? Spastic cerebral palsy, congenital ([place]) 01/30/2013  ? Tetralogy of Fallot with pulmonary atresia 08/08/2012  ? Supraventricular tachycardia ([place]) 12/15/2011  ? Atrial flutter ([place]) 12/15/2011  ? ? ?Conditions to be addressed/monitored: monitor client completion of ADLs daily ? ?Care Plan : LCSW Care Plan  ?Updates made by [name], LCSW since 02/21/2022 12:00 AM  ?  ? ?Problem: Coping Skills (General Plan of Care)   ?  ? ?Goal: Coping Skills Enhanced:Complete ADLs daily , as able   ?Start Date: 02/21/2022  ?Expected End Date: 05/19/2022  ?This Visit's Progress: On track  ?Recent Progress: On track  ?Priority: Medium  ?Note:   ?Current barriers:   ?Patient in need of assistance with connecting to community resources for possible help with daily ADLs and other daily needs ?Patient is unable to independently navigate community resource options without care coordination support ?Hearing issues ?Some pain issues ? ?Clinical Goals:  ?LCSW to communicate with client or his mother in next 30 days to discuss community resources of possible help to client in completing ADLs or other daily activities ?LCSW to talk with client or his mother in next 30 days about hearing issues of client and about mobility issues of client ?Client or his mother to communicate in next 30 days with RNCM to discuss nursing needs of client ? ?Clinical Interventions:  ?Collaboration with [name], MD regarding development and update of comprehensive plan of care as evidenced by provider attestation and co-signature ?Discussed with [name], mother of client, client pain issues and client appetite issues.  ?Discussed with [name] vision needs of client. Client wears glasses to help with vision. ?Reviewed with [name] client upcoming medical appointments ?Reviewed relaxation techniques of client (rides stationary bike, enjoys  playing music on mandolin and on guitar) Client enjoys playing music at church  ?Reviewed socialization of client. [name] said client went on a weekend trip to the home of a relative recently and that client enjoyed this weekend trip ?Encouraged client or [name] to call RNCM as needed for nursing support for client ?Discussed mood status of client. [name] feels that client has a stable mood.  He has not appeared anxious or depressed. Again, he enjoys daily activities and enjoys playing music on mandolin. ?Discussed CCM support for client with Cletus Paris ?Reviewed client completion of ADLs with [name]. She said client completes ADLs well. She said he walks well and does not use a device to help him walk ?[name] and LCSW discussed client needs related to recommended tests. She said one medical provider had recommended a cardiac catherization for client. She is hesitant for client to have that test at present.   ? ?Patient Coping Skills: ?Has strong support from his mother , Elbridge Magowan ?Completes ADLs independently ?Has hobbies her enjoys ( enjoys playing music) ? ?Patient Deficits ?Hearing challenges ?Mobility challenges ? ?Patient Goals:  ?In  next 30 days: ?Client will attend all scheduled medical appointments ?Allow time for rest and relaxation for himself ?Talk regularly with his mother about his daily needs ?Take medications as prescribed. ? ?Follow Up Plan: LCSW to call client or his mother on 04/18/22 at 2:00 PM  to assess client needs  ?  ?  ?[name].Rosealynn Mateus MSW, LCSW ?Licensed Clinical Social Worker ?[facility] ?[phone] ?

## 2022-02-21 NOTE — Patient Instructions (Addendum)
Visit Information ? ?Patient goals:  Protect My Health (Patient). Complete ADLs daily, as able ? ?Timeframe:  Short-Term Goal ?Priority:  Medium ?Progress: On Track ?Start Date:           02/21/22                     ?Expected End Date:         05/19/22            ? ?Follow Up Date  04/18/22  at 2:00 PM ?  ?Protect My Health (Patient) Complete ADLs daily, as able  ?  ?Why is this important?   ?Screening tests can find diseases early when they are easier to treat.  ?Your doctor or nurse will talk with you about which tests are important for you.  ?Getting shots for common diseases like the flu and shingles will help prevent them.   ? ?Patient Coping Skills: ?Has strong support from his mother , Traeson Dusza ?Completes ADLs independently ?Has hobbies her enjoys ( enjoys playing music) ? ?Patient Deficits ?Hearing challenges ?Mobility challenges ? ?Patient Goals:  ?In next 30 days: ?Client will attend all scheduled medical appointments ?Allow time for rest and relaxation for himself ?Talk regularly with his mother about his daily needs ?Take medications as prescribed. ?-  ?Follow Up Plan: LCSW to call client or his mother on 04/18/22 at 2:00 PM to assess client needs.   ? ?[name].Eddis Pingleton MSW, LCSW ?Licensed Clinical Social Worker ?[facility] ?[phone] ?

## 2022-02-28 ENCOUNTER — Ambulatory Visit (INDEPENDENT_AMBULATORY_CARE_PROVIDER_SITE_OTHER): Payer: Medicare Other

## 2022-02-28 VITALS — Ht 63.0 in | Wt 172.0 lb

## 2022-02-28 DIAGNOSIS — Z1211 Encounter for screening for malignant neoplasm of colon: Secondary | ICD-10-CM

## 2022-02-28 DIAGNOSIS — Z0000 Encounter for general adult medical examination without abnormal findings: Secondary | ICD-10-CM | POA: Diagnosis not present

## 2022-02-28 DIAGNOSIS — N1832 Chronic kidney disease, stage 3b: Secondary | ICD-10-CM | POA: Insufficient documentation

## 2022-02-28 DIAGNOSIS — Z1212 Encounter for screening for malignant neoplasm of rectum: Secondary | ICD-10-CM | POA: Diagnosis not present

## 2022-02-28 NOTE — Patient Instructions (Signed)
Bradley Graham , ?Thank you for taking time to come for your Medicare Wellness Visit. I appreciate your ongoing commitment to your health goals. Please review the following plan we discussed and let me know if I can assist you in the future.  ? ?Screening recommendations/referrals: ?Colonoscopy: Cologuard order placed today. ? ?Recommended yearly ophthalmology/optometry visit for glaucoma screening and checkup ?Recommended yearly dental visit for hygiene and checkup ? ?Vaccinations: ?Influenza vaccine: Done Repeat annually ? ?Pneumococcal vaccine: Due at age 46. ?Tdap vaccine: Done 02/07/2019 Repeat in 10 years ? ?Shingles vaccine: Discussed.   ?Covid-19: Done 08/07/2020 and 12/22/2019 ? ?Advanced directives: Please bring a copy of your health care power of attorney and living will to the office to be added to your chart at your convenience. ? ? ?Conditions/risks identified: Aim for 30 minutes of exercise or brisk walking, 6-8 glasses of water, and 5 servings of fruits and vegetables each day. ?KEEP UP THE GOOD WORK!! ? ?Next appointment: Follow up in one year for your annual wellness visit 2024. ? ?Preventive Care 40-64 Years, Male ?Preventive care refers to lifestyle choices and visits with your health care provider that can promote health and wellness. ?What does preventive care include? ?A yearly physical exam. This is also called an annual well check. ?Dental exams once or twice a year. ?Routine eye exams. Ask your health care provider how often you should have your eyes checked. ?Personal lifestyle choices, including: ?Daily care of your teeth and gums. ?Regular physical activity. ?Eating a healthy diet. ?Avoiding tobacco and drug use. ?Limiting alcohol use. ?Practicing safe sex. ?Taking low-dose aspirin every day starting at age 88. ?What happens during an annual well check? ?The services and screenings done by your health care provider during your annual well check will depend on your age, overall health, lifestyle  risk factors, and family history of disease. ?Counseling  ?Your health care provider may ask you questions about your: ?Alcohol use. ?Tobacco use. ?Drug use. ?Emotional well-being. ?Home and relationship well-being. ?Sexual activity. ?Eating habits. ?Work and work Statistician. ?Screening  ?You may have the following tests or measurements: ?Height, weight, and BMI. ?Blood pressure. ?Lipid and cholesterol levels. These may be checked every 5 years, or more frequently if you are over 48 years old. ?Skin check. ?Lung cancer screening. You may have this screening every year starting at age 45 if you have a 30-pack-year history of smoking and currently smoke or have quit within the past 15 years. ?Fecal occult blood test (FOBT) of the stool. You may have this test every year starting at age 30. ?Flexible sigmoidoscopy or colonoscopy. You may have a sigmoidoscopy every 5 years or a colonoscopy every 10 years starting at age 71. ?Prostate cancer screening. Recommendations will vary depending on your family history and other risks. ?Hepatitis C blood test. ?Hepatitis B blood test. ?Sexually transmitted disease (STD) testing. ?Diabetes screening. This is done by checking your blood sugar (glucose) after you have not eaten for a while (fasting). You may have this done every 1-3 years. ?Discuss your test results, treatment options, and if necessary, the need for more tests with your health care provider. ?Vaccines  ?Your health care provider may recommend certain vaccines, such as: ?Influenza vaccine. This is recommended every year. ?Tetanus, diphtheria, and acellular pertussis (Tdap, Td) vaccine. You may need a Td booster every 10 years. ?Zoster vaccine. You may need this after age 81. ?Pneumococcal 13-valent conjugate (PCV13) vaccine. You may need this if you have certain conditions and have not been  vaccinated. ?Pneumococcal polysaccharide (PPSV23) vaccine. You may need one or two doses if you smoke cigarettes or if you have  certain conditions. ?Talk to your health care provider about which screenings and vaccines you need and how often you need them. ?This information is not intended to replace advice given to you by your health care provider. Make sure you discuss any questions you have with your health care provider. ?Document Released: 12/18/2015 Document Revised: 08/10/2016 Document Reviewed: 09/22/2015 ?Elsevier Interactive Patient Education ? 2017 [place]. ? ?Fall Prevention in the Home ?Falls can cause injuries. They can happen to people of all ages. There are many things you can do to make your home safe and to help prevent falls. ?What can I do on the outside of my home? ?Regularly fix the edges of walkways and driveways and fix any cracks. ?Remove anything that might make you trip as you walk through a door, such as a raised step or threshold. ?Trim any bushes or trees on the path to your home. ?Use bright outdoor lighting. ?Clear any walking paths of anything that might make someone trip, such as rocks or tools. ?Regularly check to see if handrails are loose or broken. Make sure that both sides of any steps have handrails. ?Any raised decks and porches should have guardrails on the edges. ?Have any leaves, snow, or ice cleared regularly. ?Use sand or salt on walking paths during winter. ?Clean up any spills in your garage right away. This includes oil or grease spills. ?What can I do in the bathroom? ?Use night lights. ?Install grab bars by the toilet and in the tub and shower. Do not use towel bars as grab bars. ?Use non-skid mats or decals in the tub or shower. ?If you need to sit down in the shower, use a plastic, non-slip stool. ?Keep the floor dry. Clean up any water that spills on the floor as soon as it happens. ?Remove soap buildup in the tub or shower regularly. ?Attach bath mats securely with double-sided non-slip rug tape. ?Do not have throw rugs and other things on the floor that can make you trip. ?What can  I do in the bedroom? ?Use night lights. ?Make sure that you have a light by your bed that is easy to reach. ?Do not use any sheets or blankets that are too big for your bed. They should not hang down onto the floor. ?Have a firm chair that has side arms. You can use this for support while you get dressed. ?Do not have throw rugs and other things on the floor that can make you trip. ?What can I do in the kitchen? ?Clean up any spills right away. ?Avoid walking on wet floors. ?Keep items that you use a lot in easy-to-reach places. ?If you need to reach something above you, use a strong step stool that has a grab bar. ?Keep electrical cords out of the way. ?Do not use floor polish or wax that makes floors slippery. If you must use wax, use non-skid floor wax. ?Do not have throw rugs and other things on the floor that can make you trip. ?What can I do with my stairs? ?Do not leave any items on the stairs. ?Make sure that there are handrails on both sides of the stairs and use them. Fix handrails that are broken or loose. Make sure that handrails are as long as the stairways. ?Check any carpeting to make sure that it is firmly attached to the stairs. Fix any carpet  that is loose or worn. ?Avoid having throw rugs at the top or bottom of the stairs. If you do have throw rugs, attach them to the floor with carpet tape. ?Make sure that you have a light switch at the top of the stairs and the bottom of the stairs. If you do not have them, ask someone to add them for you. ?What else can I do to help prevent falls? ?Wear shoes that: ?Do not have high heels. ?Have rubber bottoms. ?Are comfortable and fit you well. ?Are closed at the toe. Do not wear sandals. ?If you use a stepladder: ?Make sure that it is fully opened. Do not climb a closed stepladder. ?Make sure that both sides of the stepladder are locked into place. ?Ask someone to hold it for you, if possible. ?Clearly mark and make sure that you can see: ?Any grab bars or  handrails. ?First and last steps. ?Where the edge of each step is. ?Use tools that help you move around (mobility aids) if they are needed. These include: ?Canes. ?Walkers. ?Scooters. ?Crutches. ?Turn

## 2022-02-28 NOTE — Progress Notes (Signed)
? ?Subjective:  ? Bradley Graham is a 51 y.o. male who presents for Medicare Annual/Subsequent preventive examination. ?Virtual Visit via Telephone Note ? ?I connected with  Bradley Graham on 02/28/22 at  3:30 PM EDT by telephone and verified that I am speaking with the correct person using two identifiers. ? ?Location: ?Patient:HOME ?Provider: [facility] ?Persons participating in the virtual visit: patient/Nurse Health Advisor ?  ?I discussed the limitations, risks, security and privacy concerns of performing an evaluation and management service by telephone and the availability of in person appointments. The patient expressed understanding and agreed to proceed. ? ?Interactive audio and video telecommunications were attempted between this nurse and patient, however failed, due to patient having technical difficulties OR patient did not have access to video capability.  We continued and completed visit with audio only. ? ?Some vital signs may be absent or patient reported.  ? ?Bradley Driver, LPN ? ?Review of Systems    ? ?Cardiac Risk Factors include: dyslipidemia;hypertension;male gender;sedentary lifestyle;obesity (BMI >30kg/m2);Other (see comment), Risk factor comments: Cerebral Palsey ? ?   ?Objective:  ?  ?Today's Vitals  ? 02/28/22 1532  ?Weight: 172 lb (78 kg)  ?Height: _0  (1.6 m)  ? ?Body mass index is 30.47 kg/m?. ? ? ?  02/28/2022  ?  3:39 PM 02/22/2021  ? 10:09 AM 02/10/2020  ?  8:34 AM 02/07/2019  ? 10:11 AM 06/27/2016  ? 11:10 AM 05/06/2015  ? 11:35 AM 03/16/2015  ? 10:44 AM  ?Advanced Directives  ?Does Patient Have a Medical Advance Directive? 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 No - copy requested   No - copy requested  No - copy requested   ?Would patient like information on creating a medical advance directive?  No -  Guardian declined  Yes (MAU/Ambulatory/Procedural Areas - Information given)   No - patient declined information  ? ? ?Current Medications (verified) ?Outpatient Encounter Medications as of 02/28/2022  ?Medication Sig  ? ACCU-CHEK GUIDE test strip TEST UP TO 4 TIMES DAILY AS DIRECTED. E11.9  ? Accu-Chek Softclix Lancets lancets Test BS QID Dx E11.9  ? allopurinol (ZYLOPRIM) 100 MG tablet Take 50 mg by mouth daily.  ? aspirin 81 MG tablet Take 81 mg by mouth daily.  ? blood glucose meter kit and supplies KIT Dispense based on patient and insurance preference. Use up to four times daily as directed. (FOR ICD-10 : E11.9  ? Cholecalciferol 25 MCG (1000 UT) capsule 1 tablet  ? Colchicine 0.6 MG CAPS Take 1 capsule by mouth every other day.  ? docusate sodium (COLACE) 100 MG capsule Take 100 mg by mouth daily.  ? doxycycline (VIBRA-TABS) 100 MG tablet Take 1 tablet (100 mg total) by mouth 2 (two) times daily.  ? famotidine (PEPCID) 10 MG tablet Take 1 tablet (10 mg total) by mouth 2 (two) times daily.  ? fexofenadine (ALLEGRA) 180 MG tablet Take by mouth.  ? fluticasone (FLONASE) 50 MCG/ACT nasal spray PLACE 1 SPRAY INTO BOTH NOSTRILS 2 (TWO) TIMES DAILY  ? linagliptin (TRADJENTA) 5 MG TABS tablet Take 1 tablet (5 mg total) by mouth daily. For diabetes  ? loratadine (CLARITIN) 10 MG tablet Take 1 tablet (10 mg total) by mouth daily.  ? lovastatin (MEVACOR) 20 MG tablet Take 1 tablet (20  mg total) by mouth at bedtime. (NEEDS TO BE SEEN BEFORE NEXT REFILL)  ? olmesartan (BENICAR) 20 MG tablet Take 1 tablet (20 mg total) by mouth daily.  ? ?No facility-administered encounter medications on file as of 02/28/2022.  ? ? ?Allergies (verified) ?Aleve [naproxen sodium], Bactrim [sulfamethoxazole-trimethoprim], Erythromycin, Heparin, Nsaids, Penicillins, and Cortisporin [bacitra-neomycin-polymyxin-hc]  ? ?History: ?Past Medical History:  ?Diagnosis Date  ? Atrial fibrillation ([place])   ? CHF (congestive heart failure) ([place])   ? CKD  (chronic kidney disease)   ? Club foot of both feet   ? Hypertension   ? Left ventricular hypertrophy   ? Right ventricular hypertrophy   ? Speech impediment   ? Tetralogy of Fallot   ? s/p waterson shunt  ? Vitamin D deficiency   ? ?Past Surgical History:  ?Procedure Laterality Date  ? ABDOMINAL HERNIA REPAIR    ? [facility]   ? ADENOIDECTOMY    ? MOUTH SURGERY    ? scoliosis    ? VSD REPAIR    ? WRIST SURGERY Left   ? ?Family History  ?Problem Relation Age of Onset  ? Hyperlipidemia Mother   ? Hyperthyroidism Mother   ? Diabetes insipidus Mother   ? Diabetes Father   ? Hyperlipidemia Father   ? Hypertension Father   ? Hyperthyroidism Sister   ? Cancer Maternal Grandfather 28  ?     breast cancer  ? Pernicious anemia Maternal Grandfather   ? Cancer Cousin   ?     colon cance  ? ?Social History  ? ?Socioeconomic History  ? Marital status: Single  ?  Spouse name: Not on file  ? Number of children: Not on file  ? Years of education: Not on file  ? Highest education level: 12th grade  ?Occupational History  ? Not on file  ?Tobacco Use  ? Smoking status: Never  ? Smokeless tobacco: Never  ?Vaping Use  ? Vaping Use: Never used  ?Substance and Sexual Activity  ? Alcohol use: No  ? Drug use: No  ? Sexual activity: Never  ?Other Topics Concern  ? Not on file  ?Social History Narrative  ? Single, lives with parents.  ? ?Social Determinants of Health  ? ?Financial Resource Strain: Low Risk   ? Difficulty of Paying Living Expenses: Not hard at all  ?Food Insecurity: No Food Insecurity  ? Worried About Charity fundraiser in the Last Year: Never true  ? Ran Out of Food in the Last Year: Never true  ?Transportation Needs: No Transportation Needs  ? Lack of Transportation (Medical): No  ? Lack of Transportation (Non-Medical): No  ?Physical Activity: Sufficiently Active  ? Days of Exercise per Week: 5 days  ? Minutes of Exercise per Session: 30 min  ?Stress: No Stress Concern Present  ? Feeling of Stress : Not at all  ?Social  Connections: Moderately Integrated  ? Frequency of Communication with Friends and Family: More than three times a week  ? Frequency of Social Gatherings with Friends and Family: More than three times a week  ? Attends Religious Services: More than 4 times per year  ? Active Member of Clubs or Organizations: Yes  ? Attends Archivist Meetings: More than 4 times per year  ? Marital Status: Never married  ? ? ?Tobacco Counseling ?Counseling given: Not Answered ? ? ?Clinical Intake: ? ?Pre-visit preparation completed: Yes ? ?Pain : No/denies pain ? ?  ? ?BMI - recorded: 30.47 ?Nutritional Status: BMI >  30  Obese ?Nutritional Risks: None ?Diabetes: No ? ?How often do you need to have someone help you when you read instructions, pamphlets, or other written materials from your doctor or pharmacy?: 1 - Never ? ?Diabetic?Nutrition Risk Assessment: ? ?Has the patient had any N/V/D within the last 2 months?  No  ?Does the patient have any non-healing wounds?  No  ?Has the patient had any unintentional weight loss or weight gain?  No  ? ?Diabetes: ? ?Is the patient diabetic?  Yes  ?If diabetic, was a CBG obtained today?  No  ?Did the patient bring in their glucometer from home?  No  ?How often do you monitor your CBG's? Daily.  ? ?Financial Strains and Diabetes Management: ? ?Are you having any financial strains with the device, your supplies or your medication? Yes .  ?Does the patient want to be seen by Chronic Care Management for management of their diabetes?  No  ?Would the patient like to be referred to a Nutritionist or for Diabetic Management?  No  ? ?Diabetic Exams: ? ?Diabetic Eye Exam: Completed 2022.  Pt has been advised about the importance in completing this exam.  ?Diabetic Foot Exam: Completed DUE. Pt has been advised about the importance in completing this exam. ? ?Interpreter Needed?: No ? ?Information entered by :: mj Atarah Cadogan, lpn ? ? ?Activities of Daily Living ? ?  02/28/2022  ?  3:41 PM  ?In your  present state of health, do you have any difficulty performing the following activities:  ?Hearing? 0  ?Vision? 0  ?Difficulty concentrating or making decisions? 0  ?Walking or climbing stairs? 0  ?Dressing or bathing? 0

## 2022-03-11 DIAGNOSIS — Z1212 Encounter for screening for malignant neoplasm of rectum: Secondary | ICD-10-CM | POA: Diagnosis not present

## 2022-03-11 DIAGNOSIS — Z1211 Encounter for screening for malignant neoplasm of colon: Secondary | ICD-10-CM | POA: Diagnosis not present

## 2022-03-18 LAB — COLOGUARD: COLOGUARD: NEGATIVE

## 2022-04-04 DIAGNOSIS — N1832 Chronic kidney disease, stage 3b: Secondary | ICD-10-CM | POA: Diagnosis not present

## 2022-04-04 DIAGNOSIS — E785 Hyperlipidemia, unspecified: Secondary | ICD-10-CM | POA: Diagnosis not present

## 2022-04-04 DIAGNOSIS — D631 Anemia in chronic kidney disease: Secondary | ICD-10-CM | POA: Diagnosis not present

## 2022-04-04 DIAGNOSIS — N183 Chronic kidney disease, stage 3 unspecified: Secondary | ICD-10-CM | POA: Diagnosis not present

## 2022-04-04 DIAGNOSIS — Z8719 Personal history of other diseases of the digestive system: Secondary | ICD-10-CM | POA: Diagnosis not present

## 2022-04-04 DIAGNOSIS — Z8774 Personal history of (corrected) congenital malformations of heart and circulatory system: Secondary | ICD-10-CM | POA: Diagnosis not present

## 2022-04-04 DIAGNOSIS — I272 Pulmonary hypertension, unspecified: Secondary | ICD-10-CM | POA: Diagnosis not present

## 2022-04-04 DIAGNOSIS — M109 Gout, unspecified: Secondary | ICD-10-CM | POA: Diagnosis not present

## 2022-04-04 DIAGNOSIS — I129 Hypertensive chronic kidney disease with stage 1 through stage 4 chronic kidney disease, or unspecified chronic kidney disease: Secondary | ICD-10-CM | POA: Diagnosis not present

## 2022-04-04 DIAGNOSIS — Z9889 Other specified postprocedural states: Secondary | ICD-10-CM | POA: Diagnosis not present

## 2022-04-18 ENCOUNTER — Telehealth: Payer: Medicare Other

## 2022-05-13 ENCOUNTER — Other Ambulatory Visit: Payer: Self-pay | Admitting: Family Medicine

## 2022-06-08 ENCOUNTER — Ambulatory Visit (INDEPENDENT_AMBULATORY_CARE_PROVIDER_SITE_OTHER): Payer: Medicare Other

## 2022-06-08 ENCOUNTER — Ambulatory Visit (INDEPENDENT_AMBULATORY_CARE_PROVIDER_SITE_OTHER): Payer: Medicare Other | Admitting: Family Medicine

## 2022-06-08 ENCOUNTER — Encounter: Payer: Self-pay | Admitting: Family Medicine

## 2022-06-08 VITALS — BP 118/71 | HR 90 | Temp 98.9°F | Ht 63.0 in | Wt 176.0 lb

## 2022-06-08 DIAGNOSIS — S8002XA Contusion of left knee, initial encounter: Secondary | ICD-10-CM

## 2022-06-08 DIAGNOSIS — M25562 Pain in left knee: Secondary | ICD-10-CM

## 2022-06-08 DIAGNOSIS — W19XXXA Unspecified fall, initial encounter: Secondary | ICD-10-CM

## 2022-06-08 DIAGNOSIS — S80212A Abrasion, left knee, initial encounter: Secondary | ICD-10-CM

## 2022-06-08 NOTE — Progress Notes (Signed)
Subjective:  Patient ID: Bradley Graham, male    DOB: 08-26-1971, 51 y.o.   MRN: [id number]  Patient Care Team: [name], MD as PCP - General (Family Medicine) Wilfrid Lund, MD (Otolaryngology) Ophelia Shoulder, MD (Pulmonary Disease) Mercie Eon, MD (Internal Medicine) Rocco, Micheal Likens, MD as Referring Physician (Nephrology) Shea Evans [name], LCSW as Social Worker (Licensed Clinical Social Worker) Leafy Kindle, PA-C as Consulting Physician (Rheumatology)   Chief Complaint:  Knee Pain   HPI: Bradley Graham is a 51 y.o. male presenting on 06/08/2022 for Knee Pain   Pt states he tripped over a shower chair sitting in the floor causing him to fall, landed on carpet on left knee.   Knee Pain  The incident occurred more than 1 week ago. Incident location: in hotel room. The injury mechanism was a fall. The pain is present in the left knee. The pain has been Intermittent since onset. Pertinent negatives include no inability to bear weight, loss of motion, loss of sensation, muscle weakness, numbness or tingling. He reports no foreign bodies present. The symptoms are aggravated by weight bearing and palpation. He has tried acetaminophen for the symptoms. The treatment provided significant relief.   Relevant past medical, surgical, family, and social history reviewed and updated as indicated.  Allergies and medications reviewed and updated. Data reviewed: Chart in Epic.   Past Medical History:  Diagnosis Date   Atrial fibrillation (HCC)    CHF (congestive heart failure) (HCC)    CKD (chronic kidney disease)    Club foot of both feet    Hypertension    Left ventricular hypertrophy    Right ventricular hypertrophy    Speech impediment    Tetralogy of Fallot    s/p waterson shunt   Vitamin D deficiency     Past Surgical History:  Procedure Laterality Date   ABDOMINAL HERNIA REPAIR     [facility]    ADENOIDECTOMY     MOUTH SURGERY     scoliosis     VSD REPAIR      WRIST SURGERY Left     Social History   Socioeconomic History   Marital status: Single    Spouse name: Not on file   Number of children: Not on file   Years of education: Not on file   Highest education level: 12th grade  Occupational History   Not on file  Tobacco Use   Smoking status: Never   Smokeless tobacco: Never  Vaping Use   Vaping Use: Never used  Substance and Sexual Activity   Alcohol use: No   Drug use: No   Sexual activity: Never  Other Topics Concern   Not on file  Social History Narrative   Single, lives with parents.   Social Determinants of Health   Financial Resource Strain: Low Risk  (02/28/2022)   Overall Financial Resource Strain (CARDIA)    Difficulty of Paying Living Expenses: Not hard at all  Food Insecurity: No Food Insecurity (02/28/2022)   Hunger Vital Sign    Worried About Running Out of Food in the Last Year: Never true    Ran Out of Food in the Last Year: Never true  Transportation Needs: No Transportation Needs (02/28/2022)   PRAPARE - Hydrologist (Medical): No    Lack of Transportation (Non-Medical): No  Physical Activity: Sufficiently Active (02/28/2022)   Exercise Vital Sign    Days of Exercise per Week: 5 days  Minutes of Exercise per Session: 30 min  Stress: No Stress Concern Present (02/28/2022)   [place]    Feeling of Stress : Not at all  Recent Concern: Stress - Stress Concern Present (02/21/2022)   [place]    Feeling of Stress : To some extent  Social Connections: Moderately Integrated (02/28/2022)   Social Connection and Isolation Panel [NHANES]    Frequency of Communication with Friends and Family: More than three times a week    Frequency of Social Gatherings with Friends and Family: More than three times a week    Attends Religious Services: More than 4 times  per year    Active Member of [employer] or Organizations: Yes    Attends Archivist Meetings: More than 4 times per year    Marital Status: Never married  Intimate Partner Violence: Not At Risk (02/28/2022)   Humiliation, Afraid, Rape, and Kick questionnaire    Fear of Current or Ex-Partner: No    Emotionally Abused: No    Physically Abused: No    Sexually Abused: No    Outpatient Encounter Medications as of 06/08/2022  Medication Sig   ACCU-CHEK GUIDE test strip TEST UP TO 4 TIMES DAILY AS DIRECTED. E11.9   Accu-Chek Softclix Lancets lancets Test BS QID Dx E11.9   allopurinol (ZYLOPRIM) 100 MG tablet Take 200 mg by mouth daily.   aspirin 81 MG tablet Take 81 mg by mouth daily.   blood glucose meter kit and supplies KIT Dispense based on patient and insurance preference. Use up to four times daily as directed. (FOR ICD-10 : E11.9   Colchicine 0.6 MG CAPS Take 1 capsule by mouth every other day.   docusate sodium (COLACE) 100 MG capsule Take 100 mg by mouth daily.   famotidine (PEPCID) 10 MG tablet Take 1 tablet (10 mg total) by mouth 2 (two) times daily.   fexofenadine (ALLEGRA) 180 MG tablet Take by mouth.   fluticasone (FLONASE) 50 MCG/ACT nasal spray PLACE 1 SPRAY INTO BOTH NOSTRILS 2 (TWO) TIMES DAILY   loratadine (CLARITIN) 10 MG tablet Take 1 tablet (10 mg total) by mouth daily.   lovastatin (MEVACOR) 20 MG tablet Take 1 tablet (20 mg total) by mouth at bedtime. (NEEDS TO BE SEEN BEFORE NEXT REFILL)   olmesartan (BENICAR) 20 MG tablet Take 1 tablet (20 mg total) by mouth daily.   [DISCONTINUED] Cholecalciferol 25 MCG (1000 UT) capsule 1 tablet   [DISCONTINUED] linagliptin (TRADJENTA) 5 MG TABS tablet Take 1 tablet (5 mg total) by mouth daily. For diabetes   [DISCONTINUED] olmesartan (BENICAR) 40 MG tablet Take 1 tablet by mouth daily.   [DISCONTINUED] doxycycline (VIBRA-TABS) 100 MG tablet Take 1 tablet (100 mg total) by mouth 2 (two) times daily.   No facility-administered  encounter medications on file as of 06/08/2022.    Allergies  Allergen Reactions   Aleve [Naproxen Sodium]    Bactrim [Sulfamethoxazole-Trimethoprim]    Erythromycin    Heparin     Induced thrombopenia   Nsaids    Penicillins    Cortisporin [Bacitra-Neomycin-Polymyxin-Hc] Other (See Comments)    Burned ear canal per mom    Review of Systems  Constitutional:  Negative for activity change, appetite change, chills, diaphoresis, fatigue, fever and unexpected weight change.  Respiratory:  Negative for cough and shortness of breath.   Cardiovascular:  Negative for chest pain, palpitations and leg swelling.  Gastrointestinal:  Negative for abdominal pain, nausea and vomiting.  Genitourinary:  Negative for decreased urine volume and difficulty urinating.  Musculoskeletal:  Positive for arthralgias and joint swelling. Negative for back pain, gait problem, myalgias, neck pain and neck stiffness.  Neurological:  Negative for dizziness, tingling, syncope, weakness, light-headedness, numbness and headaches.  Psychiatric/Behavioral:  Negative for confusion.   All other systems reviewed and are negative.       Objective:  BP 118/71   Pulse 90   Temp 98.9 F (37.2 C)   Ht _0  (1.6 m)   Wt 176 lb (79.8 kg)   SpO2 96%   BMI 31.18 kg/m    Wt Readings from Last 3 Encounters:  06/08/22 176 lb (79.8 kg)  02/28/22 172 lb (78 kg)  02/16/22 172 lb 9.6 oz (78.3 kg)    Physical Exam Vitals and nursing note reviewed.  Constitutional:      General: He is not in acute distress.    Appearance: Normal appearance. He is well-developed and well-groomed. He is obese. He is not ill-appearing, toxic-appearing or diaphoretic.  HENT:     Head: Normocephalic and atraumatic.     Jaw: There is normal jaw occlusion.     Right Ear: Hearing normal.     Left Ear: Hearing normal.     Mouth/Throat:     Lips: Pink.     Mouth: Mucous membranes are moist.     Pharynx: Uvula midline.  Eyes:     General:  Lids are normal.     Pupils: Pupils are equal, round, and reactive to light.  Neck:     Thyroid: No thyroid mass, thyromegaly or thyroid tenderness.     Vascular: No carotid bruit or JVD.     Trachea: Trachea and phonation normal.  Cardiovascular:     Rate and Rhythm: Normal rate and regular rhythm.     Chest Wall: PMI is not displaced.     Pulses: Normal pulses.     Heart sounds: Normal heart sounds. No murmur heard.    No friction rub. No gallop.  Pulmonary:     Effort: Pulmonary effort is normal.     Breath sounds: Normal breath sounds.  Abdominal:     General: There is no abdominal bruit.     Palpations: There is no hepatomegaly or splenomegaly.  Musculoskeletal:     Cervical back: Normal range of motion and neck supple.     Right upper leg: Normal.     Left upper leg: Normal.     Right knee: Normal.     Left knee: Swelling present. No deformity, effusion, erythema, ecchymosis, lacerations, bony tenderness or crepitus. Decreased range of motion. Tenderness present over the patellar tendon. No medial joint line, lateral joint line, MCL, LCL, ACL or PCL tenderness. No LCL laxity, MCL laxity, ACL laxity or PCL laxity.Normal alignment, normal meniscus and normal patellar mobility. Normal pulse.     Instability Tests: Anterior drawer test negative. Posterior drawer test negative. Anterior Lachman test negative. Medial McMurray test negative and lateral McMurray test negative.     Right lower leg: Normal. No edema.     Left lower leg: Normal. No edema.  Lymphadenopathy:     Cervical: No cervical adenopathy.  Skin:    General: Skin is warm and dry.     Capillary Refill: Capillary refill takes less than 2 seconds.     Coloration: Skin is not cyanotic, jaundiced or pale.     Findings: Abrasion (left anterior knee) present.  No rash.  Neurological:     General: No focal deficit present.     Mental Status: He is alert and oriented to person, place, and time.     Sensory: Sensation is  intact.     Motor: Motor function is intact.     Coordination: Coordination is intact.     Gait: Gait is intact.     Deep Tendon Reflexes: Reflexes are normal and symmetric.  Psychiatric:        Attention and Perception: Attention and perception normal.        Mood and Affect: Mood and affect normal.        Speech: Speech normal.        Behavior: Behavior normal. Behavior is cooperative.        Thought Content: Thought content normal.        Cognition and Memory: Cognition and memory normal.        Judgment: Judgment normal.     Results for orders placed or performed in visit on 02/28/22  Cologuard  Result Value Ref Range   COLOGUARD Negative Negative     X-Ray: left knee: soft tissue swelling, no obvious fracture. Preliminary x-ray reading by Monia Pouch, FNP-C, [facility].   Pertinent labs & imaging results that were available during my care of the patient were reviewed by me and considered in my medical decision making.  Assessment & Plan:  Johney was seen today for knee pain.  Diagnoses and all orders for this visit:  Fall, initial encounter Acute pain of eft knee Contusion of left knee Abrasion of left knee Imaging without acute findings. Will notify pt if radiology reading differs. Wound cleaned and Band-Aid applied. Brace applied. RICE therapy discussed in detail. Wound care discussed in detail. Tylenol as needed for pain. Report any new, worsening, or persistent symptoms. Will refer to PT and ortho if symptoms persist.. Tdap is up to date. -     DG Knee 1-2 Views Left     Continue all other maintenance medications.  Follow up plan: Return if symptoms worsen or fail to improve.   Continue healthy lifestyle choices, including diet (rich in fruits, vegetables, and lean proteins, and low in salt and simple carbohydrates) and exercise (at least 30 minutes of moderate physical activity daily).  Educational handout given for acute knee pain  The above assessment and  management plan was discussed with the patient. The patient verbalized understanding of and has agreed to the management plan. Patient is aware to call the clinic if they develop any new symptoms or if symptoms persist or worsen. Patient is aware when to return to the clinic for a follow-up visit. Patient educated on when it is appropriate to go to the emergency department.   Monia Pouch, FNP-C [facility] [phone]

## 2022-06-08 NOTE — Patient Instructions (Signed)
Rest, ice, compression, and elevation

## 2022-06-13 ENCOUNTER — Ambulatory Visit (INDEPENDENT_AMBULATORY_CARE_PROVIDER_SITE_OTHER): Payer: Medicare Other | Admitting: Family Medicine

## 2022-06-13 ENCOUNTER — Encounter: Payer: Self-pay | Admitting: Family Medicine

## 2022-06-13 VITALS — BP 122/73 | HR 96 | Temp 97.3°F | Ht 63.0 in | Wt 172.0 lb

## 2022-06-13 DIAGNOSIS — Z8739 Personal history of other diseases of the musculoskeletal system and connective tissue: Secondary | ICD-10-CM

## 2022-06-13 DIAGNOSIS — I10 Essential (primary) hypertension: Secondary | ICD-10-CM | POA: Diagnosis not present

## 2022-06-13 DIAGNOSIS — R7309 Other abnormal glucose: Secondary | ICD-10-CM | POA: Diagnosis not present

## 2022-06-13 DIAGNOSIS — E785 Hyperlipidemia, unspecified: Secondary | ICD-10-CM | POA: Diagnosis not present

## 2022-06-13 DIAGNOSIS — Z125 Encounter for screening for malignant neoplasm of prostate: Secondary | ICD-10-CM | POA: Diagnosis not present

## 2022-06-13 LAB — BAYER DCA HB A1C WAIVED: HB A1C (BAYER DCA - WAIVED): 6.3 % — ABNORMAL HIGH (ref 4.8–5.6)

## 2022-06-13 NOTE — Progress Notes (Signed)
Subjective:  Patient ID: Bradley Graham,  male    DOB: 27-Jun-1971  Age: 51 y.o.    CC: Medical Management of Chronic Issues   HPI Bradley Graham presents for  follow-up of hypertension. Patient has no history of headache chest pain or shortness of breath or recent cough. Patient also denies symptoms of TIA such as numbness weakness lateralizing. Patient denies side effects from medication. States taking it regularly.  Patient also  in for follow-up of elevated cholesterol. Doing well without complaints on current medication. Denies side effects  including myalgia and arthralgia and nausea. Also in today for liver function testing. Currently no chest pain, shortness of breath or other cardiovascular related symptoms noted.  Follow-up of diabetes. Patient does check blood sugar at home. Readings run normal range Patient denies symptoms such as excessive hunger or urinary frequency, excessive hunger, nausea No significant hypoglycemic spells noted. Medications reviewed. Pt reports taking them regularly. Pt. denies complication/adverse reaction today.    History Bradley Graham has a past medical history of Atrial fibrillation ([name]), CHF (congestive heart failure) ([name]), CKD (chronic kidney disease), Club foot of both feet, Hypertension, Left ventricular hypertrophy, Right ventricular hypertrophy, Speech impediment, Tetralogy of Fallot, and Vitamin D deficiency.   Bradley Graham has a past surgical history that includes VSD repair; scoliosis; Adenoidectomy; Mouth surgery; Wrist surgery (Left); and Abdominal hernia repair.   His family history includes Cancer in his cousin; Cancer (age of onset: 63) in his maternal grandfather; Diabetes in his father; Diabetes insipidus in his mother; Hyperlipidemia in his father and mother; Hypertension in his father; Hyperthyroidism in his mother and sister; Pernicious anemia in his maternal grandfather.Bradley Graham reports that Bradley Graham has never smoked. Bradley Graham has never used smokeless tobacco. Bradley Graham  reports that Bradley Graham does not drink alcohol and does not use drugs.  Current Outpatient Medications on File Prior to Visit  Medication Sig Dispense Refill   ACCU-CHEK GUIDE test strip TEST UP TO 4 TIMES DAILY AS DIRECTED. E11.9 400 strip 3   Accu-Chek Softclix Lancets lancets Test BS QID Dx E11.9 400 each 3   allopurinol (ZYLOPRIM) 100 MG tablet Take 200 mg by mouth daily.     aspirin 81 MG tablet Take 81 mg by mouth daily.     blood glucose meter kit and supplies KIT Dispense based on patient and insurance preference. Use up to four times daily as directed. (FOR ICD-10 : E11.9 1 each 11   Colchicine 0.6 MG CAPS Take 1 capsule by mouth every other day.     docusate sodium (COLACE) 100 MG capsule Take 100 mg by mouth daily.     famotidine (PEPCID) 10 MG tablet Take 1 tablet (10 mg total) by mouth 2 (two) times daily. 180 tablet 3   fexofenadine (ALLEGRA) 180 MG tablet Take by mouth.     fluticasone (FLONASE) 50 MCG/ACT nasal spray PLACE 1 SPRAY INTO BOTH NOSTRILS 2 (TWO) TIMES DAILY 48 mL 1   loratadine (CLARITIN) 10 MG tablet Take 1 tablet (10 mg total) by mouth daily. 90 tablet 1   lovastatin (MEVACOR) 20 MG tablet Take 1 tablet (20 mg total) by mouth at bedtime. (NEEDS TO BE SEEN BEFORE NEXT REFILL) 90 tablet 3   olmesartan (BENICAR) 20 MG tablet Take 1 tablet (20 mg total) by mouth daily. 90 tablet 3   No current facility-administered medications on file prior to visit.    ROS Review of Systems  Constitutional:  Negative for fever.  Respiratory:  Negative for shortness of  breath.   Cardiovascular:  Negative for chest pain.  Musculoskeletal:  Negative for arthralgias.  Skin:  Negative for rash.    Objective:  BP 122/73   Pulse 96   Temp (!) 97.3 F (36.3 C)   Ht '5\' 3"'  (1.6 m)   Wt 172 lb (78 kg)   SpO2 93%   BMI 30.47 kg/m   BP Readings from Last 3 Encounters:  06/13/22 122/73  06/08/22 118/71  02/16/22 133/79    Wt Readings from Last 3 Encounters:  06/13/22 172 lb (78  kg)  06/08/22 176 lb (79.8 kg)  02/28/22 172 lb (78 kg)     Physical Exam Vitals reviewed.  Constitutional:      Appearance: Bradley Graham is well-developed.  HENT:     Head: Normocephalic and atraumatic.     Right Ear: External ear normal.     Left Ear: External ear normal.     Mouth/Throat:     Pharynx: No oropharyngeal exudate or posterior oropharyngeal erythema.  Eyes:     Pupils: Pupils are equal, round, and reactive to light.  Cardiovascular:     Rate and Rhythm: Normal rate and regular rhythm.     Heart sounds: No murmur heard. Pulmonary:     Effort: No respiratory distress.     Breath sounds: Normal breath sounds.  Musculoskeletal:     Cervical back: Normal range of motion and neck supple.  Neurological:     Mental Status: Bradley Graham is alert and oriented to person, place, and time.     Diabetic Foot Exam - Simple   No data filed     Lab Results  Component Value Date   HGBA1C 6.3 (H) 02/16/2022   HGBA1C 6.4 (H) 11/16/2021   HGBA1C 10.2 (H) 01/12/2021    Assessment & Plan:   Bradley Graham was seen today for medical management of chronic issues.  Diagnoses and all orders for this visit:  Hyperlipidemia, unspecified hyperlipidemia type -     Lipid panel  Essential hypertension, benign -     CBC with Differential/Platelet -     CMP14+EGFR  Elevated hemoglobin A1c -     Bayer DCA Hb A1c Waived  Screening for prostate cancer -     PSA, total and free  History of gout -     Uric acid   I am having Bradley Graham maintain his docusate sodium, aspirin, loratadine, famotidine, blood glucose meter kit and supplies, Accu-Chek Softclix Lancets, allopurinol, Colchicine, lovastatin, olmesartan, fexofenadine, fluticasone, and Accu-Chek Guide.  No orders of the defined types were placed in this encounter.    Follow-up: Return in about 3 months (around 09/13/2022).  [name], M.D.

## 2022-06-14 LAB — CBC WITH DIFFERENTIAL/PLATELET
Basophils Absolute: 0.1 10*3/uL (ref 0.0–0.2)
Basos: 1 %
EOS (ABSOLUTE): 0.8 10*3/uL — ABNORMAL HIGH (ref 0.0–0.4)
Eos: 12 %
Hematocrit: 40.1 % (ref 37.5–51.0)
Hemoglobin: 13.7 g/dL (ref 13.0–17.7)
Immature Grans (Abs): 0 10*3/uL (ref 0.0–0.1)
Immature Granulocytes: 0 %
Lymphocytes Absolute: 1.3 10*3/uL (ref 0.7–3.1)
Lymphs: 18 %
MCH: 31.6 pg (ref 26.6–33.0)
MCHC: 34.2 g/dL (ref 31.5–35.7)
MCV: 93 fL (ref 79–97)
Monocytes Absolute: 0.6 10*3/uL (ref 0.1–0.9)
Monocytes: 9 %
Neutrophils Absolute: 4.2 10*3/uL (ref 1.4–7.0)
Neutrophils: 60 %
Platelets: 134 10*3/uL — ABNORMAL LOW (ref 150–450)
RBC: 4.33 x10E6/uL (ref 4.14–5.80)
RDW: 13.2 % (ref 11.6–15.4)
WBC: 7 10*3/uL (ref 3.4–10.8)

## 2022-06-14 LAB — LIPID PANEL
Chol/HDL Ratio: 2.8 ratio (ref 0.0–5.0)
Cholesterol, Total: 165 mg/dL (ref 100–199)
HDL: 59 mg/dL (ref >39)
LDL Chol Calc (NIH): 89 mg/dL (ref 0–99)
Triglycerides: 90 mg/dL (ref 0–149)
VLDL Cholesterol Cal: 17 mg/dL (ref 5–40)

## 2022-06-14 LAB — PSA, TOTAL AND FREE
PSA, Free Pct: 57.2 %
PSA, Free: 1.03 ng/mL
Prostate Specific Ag, Serum: 1.8 ng/mL (ref 0.0–4.0)

## 2022-06-14 LAB — CMP14+EGFR
ALT: 23 IU/L (ref 0–44)
AST: 27 IU/L (ref 0–40)
Albumin/Globulin Ratio: 2.2 (ref 1.2–2.2)
Albumin: 4.6 g/dL (ref 3.8–4.9)
Alkaline Phosphatase: 118 IU/L (ref 44–121)
BUN/Creatinine Ratio: 16 (ref 9–20)
BUN: 32 mg/dL — ABNORMAL HIGH (ref 6–24)
Bilirubin Total: 0.5 mg/dL (ref 0.0–1.2)
CO2: 22 mmol/L (ref 20–29)
Calcium: 8.8 mg/dL (ref 8.7–10.2)
Chloride: 101 mmol/L (ref 96–106)
Creatinine, Ser: 2.04 mg/dL — ABNORMAL HIGH (ref 0.76–1.27)
Globulin, Total: 2.1 g/dL (ref 1.5–4.5)
Glucose: 118 mg/dL — ABNORMAL HIGH (ref 70–99)
Potassium: 4.7 mmol/L (ref 3.5–5.2)
Sodium: 138 mmol/L (ref 134–144)
Total Protein: 6.7 g/dL (ref 6.0–8.5)
eGFR: 39 mL/min/{1.73_m2} — ABNORMAL LOW (ref >59)

## 2022-06-14 LAB — URIC ACID: Uric Acid: 5.5 mg/dL (ref 3.8–8.4)

## 2022-07-07 DIAGNOSIS — H6123 Impacted cerumen, bilateral: Secondary | ICD-10-CM | POA: Diagnosis not present

## 2022-07-18 DIAGNOSIS — M1A09X0 Idiopathic chronic gout, multiple sites, without tophus (tophi): Secondary | ICD-10-CM | POA: Diagnosis not present

## 2022-07-18 DIAGNOSIS — M2550 Pain in unspecified joint: Secondary | ICD-10-CM | POA: Diagnosis not present

## 2022-07-18 DIAGNOSIS — M79672 Pain in left foot: Secondary | ICD-10-CM | POA: Diagnosis not present

## 2022-07-18 DIAGNOSIS — N181 Chronic kidney disease, stage 1: Secondary | ICD-10-CM | POA: Diagnosis not present

## 2022-07-21 ENCOUNTER — Telehealth: Payer: Self-pay | Admitting: Family Medicine

## 2022-07-21 NOTE — Telephone Encounter (Signed)
faxed

## 2022-07-25 LAB — HM DIABETES EYE EXAM

## 2022-07-26 ENCOUNTER — Other Ambulatory Visit: Payer: Self-pay | Admitting: Family Medicine

## 2022-07-28 DIAGNOSIS — Z8679 Personal history of other diseases of the circulatory system: Secondary | ICD-10-CM | POA: Diagnosis not present

## 2022-07-28 DIAGNOSIS — I440 Atrioventricular block, first degree: Secondary | ICD-10-CM | POA: Diagnosis not present

## 2022-07-28 DIAGNOSIS — M109 Gout, unspecified: Secondary | ICD-10-CM | POA: Diagnosis not present

## 2022-07-28 DIAGNOSIS — I4510 Unspecified right bundle-branch block: Secondary | ICD-10-CM | POA: Diagnosis not present

## 2022-07-28 DIAGNOSIS — Z9889 Other specified postprocedural states: Secondary | ICD-10-CM | POA: Diagnosis not present

## 2022-07-28 DIAGNOSIS — N183 Chronic kidney disease, stage 3 unspecified: Secondary | ICD-10-CM | POA: Diagnosis not present

## 2022-07-28 DIAGNOSIS — Z952 Presence of prosthetic heart valve: Secondary | ICD-10-CM | POA: Diagnosis not present

## 2022-07-28 DIAGNOSIS — I130 Hypertensive heart and chronic kidney disease with heart failure and stage 1 through stage 4 chronic kidney disease, or unspecified chronic kidney disease: Secondary | ICD-10-CM | POA: Diagnosis not present

## 2022-07-28 DIAGNOSIS — R000 Tachycardia, unspecified: Secondary | ICD-10-CM | POA: Diagnosis not present

## 2022-07-28 DIAGNOSIS — Q255 Atresia of pulmonary artery: Secondary | ICD-10-CM | POA: Diagnosis not present

## 2022-07-28 DIAGNOSIS — I509 Heart failure, unspecified: Secondary | ICD-10-CM | POA: Diagnosis not present

## 2022-07-28 DIAGNOSIS — Q213 Tetralogy of Fallot: Secondary | ICD-10-CM | POA: Diagnosis not present

## 2022-07-28 DIAGNOSIS — E7800 Pure hypercholesterolemia, unspecified: Secondary | ICD-10-CM | POA: Diagnosis not present

## 2022-07-28 DIAGNOSIS — E1122 Type 2 diabetes mellitus with diabetic chronic kidney disease: Secondary | ICD-10-CM | POA: Diagnosis not present

## 2022-07-28 DIAGNOSIS — Z8774 Personal history of (corrected) congenital malformations of heart and circulatory system: Secondary | ICD-10-CM | POA: Diagnosis not present

## 2022-09-26 ENCOUNTER — Other Ambulatory Visit: Payer: Self-pay | Admitting: Family Medicine

## 2022-09-26 DIAGNOSIS — E785 Hyperlipidemia, unspecified: Secondary | ICD-10-CM

## 2022-10-03 ENCOUNTER — Ambulatory Visit (INDEPENDENT_AMBULATORY_CARE_PROVIDER_SITE_OTHER): Payer: Medicare Other | Admitting: Family Medicine

## 2022-10-03 ENCOUNTER — Encounter: Payer: Self-pay | Admitting: Family Medicine

## 2022-10-03 VITALS — BP 144/75 | HR 92 | Temp 98.1°F | Ht 63.0 in | Wt 175.6 lb

## 2022-10-03 DIAGNOSIS — I10 Essential (primary) hypertension: Secondary | ICD-10-CM | POA: Diagnosis not present

## 2022-10-03 DIAGNOSIS — E785 Hyperlipidemia, unspecified: Secondary | ICD-10-CM

## 2022-10-03 DIAGNOSIS — N189 Chronic kidney disease, unspecified: Secondary | ICD-10-CM

## 2022-10-03 DIAGNOSIS — M2041 Other hammer toe(s) (acquired), right foot: Secondary | ICD-10-CM

## 2022-10-03 DIAGNOSIS — M2042 Other hammer toe(s) (acquired), left foot: Secondary | ICD-10-CM

## 2022-10-03 DIAGNOSIS — R7309 Other abnormal glucose: Secondary | ICD-10-CM

## 2022-10-03 DIAGNOSIS — E790 Hyperuricemia without signs of inflammatory arthritis and tophaceous disease: Secondary | ICD-10-CM | POA: Diagnosis not present

## 2022-10-03 LAB — BAYER DCA HB A1C WAIVED: HB A1C (BAYER DCA - WAIVED): 6.4 % — ABNORMAL HIGH (ref 4.8–5.6)

## 2022-10-03 MED ORDER — OLMESARTAN MEDOXOMIL 20 MG PO TABS: 20.0000 mg | ORAL_TABLET | Freq: Every day | ORAL | 2 refills | Status: DC

## 2022-10-03 MED ORDER — ATORVASTATIN CALCIUM 40 MG PO TABS: 40.0000 mg | ORAL_TABLET | Freq: Every day | ORAL | 3 refills | Status: DC

## 2022-10-03 NOTE — Progress Notes (Signed)
Subjective:  Patient ID: Bradley Graham,  male    DOB: 06-06-1971  Age: 51 y.o.    CC: Medical Management of Chronic Issues   HPI Bradley Graham presents for  follow-up of hypertension. Patient has no history of headache chest pain or shortness of breath or recent cough. Patient also denies symptoms of TIA such as numbness weakness lateralizing. Patient denies side effects from medication. States taking it regularly.SCheduled for heart cath and TEE soon.   Patient also  in for follow-up of elevated cholesterol. Doing well without complaints on current medication. Denies side effects  including myalgia and arthralgia and nausea. Also in today for liver function testing. Currently no chest pain, shortness of breath or other cardiovascular related symptoms noted.  Follow-up of diabetes. Patient does check blood sugar at home. Readings run between 80 and 130 Patient denies symptoms such as excessive hunger or urinary frequency, excessive hunger, nausea No significant hypoglycemic spells noted. Medications reviewed. Pt reports taking them regularly. Pt. denies complication/adverse reaction today.    History Bradley Graham has a past medical history of Atrial fibrillation ([place]), CHF (congestive heart failure) ([place]), CKD (chronic kidney disease), Club foot of both feet, Hypertension, Left ventricular hypertrophy, Right ventricular hypertrophy, Speech impediment, Tetralogy of Fallot, and Vitamin D deficiency.   He has a past surgical history that includes VSD repair; scoliosis; Adenoidectomy; Mouth surgery; Wrist surgery (Left); and Abdominal hernia repair.   His family history includes Cancer in his cousin; Cancer (age of onset: 49) in his maternal grandfather; Diabetes in his father; Diabetes insipidus in his mother; Hyperlipidemia in his father and mother; Hypertension in his father; Hyperthyroidism in his mother and sister; Pernicious anemia in his maternal grandfather.He reports that he has never  smoked. He has never used smokeless tobacco. He reports that he does not drink alcohol and does not use drugs.  Current Outpatient Medications on File Prior to Visit  Medication Sig Dispense Refill   ACCU-CHEK GUIDE test strip TEST UP TO 4 TIMES DAILY AS DIRECTED. E11.9 400 strip 3   Accu-Chek Softclix Lancets lancets TEST BLOOD SUGAR 4 TIMES DAILY DX E11.9 400 each 3   allopurinol (ZYLOPRIM) 100 MG tablet Take 200 mg by mouth daily.     aspirin 81 MG tablet Take 81 mg by mouth daily.     blood glucose meter kit and supplies KIT Dispense based on patient and insurance preference. Use up to four times daily as directed. (FOR ICD-10 : E11.9 1 each 11   Colchicine 0.6 MG CAPS Take 1 capsule by mouth every other day.     docusate sodium (COLACE) 100 MG capsule Take 100 mg by mouth daily.     famotidine (PEPCID) 10 MG tablet Take 1 tablet (10 mg total) by mouth 2 (two) times daily. 180 tablet 3   fexofenadine (ALLEGRA) 180 MG tablet Take by mouth.     fluticasone (FLONASE) 50 MCG/ACT nasal spray PLACE 1 SPRAY INTO BOTH NOSTRILS 2 (TWO) TIMES DAILY 48 mL 1   loratadine (CLARITIN) 10 MG tablet Take 1 tablet (10 mg total) by mouth daily. 90 tablet 1   No current facility-administered medications on file prior to visit.    ROS Review of Systems  Constitutional:  Negative for fever.  Respiratory:  Negative for shortness of breath.   Cardiovascular:  Negative for chest pain.  Musculoskeletal:  Negative for arthralgias.  Skin:  Negative for rash.    Objective:  BP (!) 144/75   Pulse 92  Temp 98.1 F (36.7 C)   Ht _0  (1.6 m)   Wt 175 lb 9.6 oz (79.7 kg)   SpO2 93%   BMI 31.11 kg/m   BP Readings from Last 3 Encounters:  10/03/22 (!) 144/75  06/13/22 122/73  06/08/22 118/71    Wt Readings from Last 3 Encounters:  10/03/22 175 lb 9.6 oz (79.7 kg)  06/13/22 172 lb (78 kg)  06/08/22 176 lb (79.8 kg)     Physical Exam Vitals reviewed.  Constitutional:      Appearance: He is  well-developed.  HENT:     Head: Normocephalic and atraumatic.     Right Ear: External ear normal.     Left Ear: External ear normal.     Mouth/Throat:     Pharynx: No oropharyngeal exudate or posterior oropharyngeal erythema.  Eyes:     Pupils: Pupils are equal, round, and reactive to light.  Cardiovascular:     Rate and Rhythm: Normal rate and regular rhythm.     Heart sounds: No murmur heard. Pulmonary:     Effort: No respiratory distress.     Breath sounds: Normal breath sounds.  Musculoskeletal:     Cervical back: Normal range of motion and neck supple.  Neurological:     Mental Status: He is alert and oriented to person, place, and time.     Diabetic Foot Exam - Simple   Simple Foot Form Diabetic Foot exam was performed with the following findings: Yes 10/03/2022  8:54 AM  Visual Inspection See comments: Yes Sensation Testing Intact to touch and monofilament testing bilaterally: Yes Pulse Check Posterior Tibialis and Dorsalis pulse intact bilaterally: Yes Comments Hammer toes left 1-4 with bunion bilaterally.      Lab Results  Component Value Date   HGBA1C 6.3 (H) 06/13/2022   HGBA1C 6.3 (H) 02/16/2022   HGBA1C 6.4 (H) 11/16/2021    Assessment & Plan:   Bradley Graham was seen today for medical management of chronic issues.  Diagnoses and all orders for this visit:  Elevated hemoglobin A1c -     Bayer DCA Hb A1c Waived  Essential hypertension, benign -     CBC with Differential/Platelet -     CMP14+EGFR  Hyperlipidemia, unspecified hyperlipidemia type -     Lipid panel  Chronic kidney disease, unspecified CKD stage -     olmesartan (BENICAR) 20 MG tablet; Take 1 tablet (20 mg total) by mouth daily.  Hammer toes of both feet  Hyperuricemia -     Uric acid  Other orders -     atorvastatin (LIPITOR) 40 MG tablet; Take 1 tablet (40 mg total) by mouth daily. For cholesterol   I have discontinued Bradley Graham's lovastatin. I am also having him start on  atorvastatin. Additionally, I am having him maintain his docusate sodium, aspirin, loratadine, famotidine, blood glucose meter kit and supplies, allopurinol, Colchicine, fexofenadine, Accu-Chek Guide, Accu-Chek Softclix Lancets, fluticasone, and olmesartan.  Meds ordered this encounter  Medications   olmesartan (BENICAR) 20 MG tablet    Sig: Take 1 tablet (20 mg total) by mouth daily.    Dispense:  90 tablet    Refill:  2   atorvastatin (LIPITOR) 40 MG tablet    Sig: Take 1 tablet (40 mg total) by mouth daily. For cholesterol    Dispense:  90 tablet    Refill:  3     Follow-up: Return in about 3 months (around 01/03/2023).  [name], M.D.

## 2022-10-04 LAB — CMP14+EGFR
ALT: 12 IU/L (ref 0–44)
AST: 17 IU/L (ref 0–40)
Albumin/Globulin Ratio: 2.3 — ABNORMAL HIGH (ref 1.2–2.2)
Albumin: 4.3 g/dL (ref 3.8–4.9)
Alkaline Phosphatase: 105 IU/L (ref 44–121)
BUN/Creatinine Ratio: 13 (ref 9–20)
BUN: 26 mg/dL — ABNORMAL HIGH (ref 6–24)
Bilirubin Total: 0.4 mg/dL (ref 0.0–1.2)
CO2: 22 mmol/L (ref 20–29)
Calcium: 9 mg/dL (ref 8.7–10.2)
Chloride: 106 mmol/L (ref 96–106)
Creatinine, Ser: 1.95 mg/dL — ABNORMAL HIGH (ref 0.76–1.27)
Globulin, Total: 1.9 g/dL (ref 1.5–4.5)
Glucose: 136 mg/dL — ABNORMAL HIGH (ref 70–99)
Potassium: 4.8 mmol/L (ref 3.5–5.2)
Sodium: 144 mmol/L (ref 134–144)
Total Protein: 6.2 g/dL (ref 6.0–8.5)
eGFR: 41 mL/min/{1.73_m2} — ABNORMAL LOW (ref >59)

## 2022-10-04 LAB — CBC WITH DIFFERENTIAL/PLATELET
Basophils Absolute: 0.1 10*3/uL (ref 0.0–0.2)
Basos: 1 %
EOS (ABSOLUTE): 1.1 10*3/uL — ABNORMAL HIGH (ref 0.0–0.4)
Eos: 14 %
Hematocrit: 38.6 % (ref 37.5–51.0)
Hemoglobin: 13 g/dL (ref 13.0–17.7)
Immature Grans (Abs): 0 10*3/uL (ref 0.0–0.1)
Immature Granulocytes: 1 %
Lymphocytes Absolute: 1.3 10*3/uL (ref 0.7–3.1)
Lymphs: 16 %
MCH: 32 pg (ref 26.6–33.0)
MCHC: 33.7 g/dL (ref 31.5–35.7)
MCV: 95 fL (ref 79–97)
Monocytes Absolute: 0.6 10*3/uL (ref 0.1–0.9)
Monocytes: 8 %
Neutrophils Absolute: 5 10*3/uL (ref 1.4–7.0)
Neutrophils: 60 %
Platelets: 123 10*3/uL — ABNORMAL LOW (ref 150–450)
RBC: 4.06 x10E6/uL — ABNORMAL LOW (ref 4.14–5.80)
RDW: 13.3 % (ref 11.6–15.4)
WBC: 8.1 10*3/uL (ref 3.4–10.8)

## 2022-10-04 LAB — LIPID PANEL
Chol/HDL Ratio: 3 ratio (ref 0.0–5.0)
Cholesterol, Total: 166 mg/dL (ref 100–199)
HDL: 56 mg/dL (ref >39)
LDL Chol Calc (NIH): 94 mg/dL (ref 0–99)
Triglycerides: 84 mg/dL (ref 0–149)
VLDL Cholesterol Cal: 16 mg/dL (ref 5–40)

## 2022-10-10 DIAGNOSIS — J9690 Respiratory failure, unspecified, unspecified whether with hypoxia or hypercapnia: Secondary | ICD-10-CM | POA: Diagnosis not present

## 2022-10-10 DIAGNOSIS — M109 Gout, unspecified: Secondary | ICD-10-CM | POA: Diagnosis not present

## 2022-10-10 DIAGNOSIS — Z930 Tracheostomy status: Secondary | ICD-10-CM | POA: Diagnosis not present

## 2022-10-10 DIAGNOSIS — Z8774 Personal history of (corrected) congenital malformations of heart and circulatory system: Secondary | ICD-10-CM | POA: Diagnosis not present

## 2022-10-10 DIAGNOSIS — Z882 Allergy status to sulfonamides status: Secondary | ICD-10-CM | POA: Diagnosis not present

## 2022-10-10 DIAGNOSIS — D631 Anemia in chronic kidney disease: Secondary | ICD-10-CM | POA: Diagnosis not present

## 2022-10-10 DIAGNOSIS — N1832 Chronic kidney disease, stage 3b: Secondary | ICD-10-CM | POA: Diagnosis not present

## 2022-10-10 DIAGNOSIS — M1030 Gout due to renal impairment, unspecified site: Secondary | ICD-10-CM | POA: Diagnosis not present

## 2022-10-10 DIAGNOSIS — Z881 Allergy status to other antibiotic agents status: Secondary | ICD-10-CM | POA: Diagnosis not present

## 2022-10-10 DIAGNOSIS — I272 Pulmonary hypertension, unspecified: Secondary | ICD-10-CM | POA: Diagnosis not present

## 2022-10-10 DIAGNOSIS — I724 Aneurysm of artery of lower extremity: Secondary | ICD-10-CM | POA: Diagnosis not present

## 2022-10-10 DIAGNOSIS — I129 Hypertensive chronic kidney disease with stage 1 through stage 4 chronic kidney disease, or unspecified chronic kidney disease: Secondary | ICD-10-CM | POA: Diagnosis not present

## 2022-10-10 DIAGNOSIS — Z992 Dependence on renal dialysis: Secondary | ICD-10-CM | POA: Diagnosis not present

## 2022-10-10 DIAGNOSIS — N183 Chronic kidney disease, stage 3 unspecified: Secondary | ICD-10-CM | POA: Diagnosis not present

## 2022-10-10 DIAGNOSIS — E785 Hyperlipidemia, unspecified: Secondary | ICD-10-CM | POA: Diagnosis not present

## 2022-10-10 DIAGNOSIS — I130 Hypertensive heart and chronic kidney disease with heart failure and stage 1 through stage 4 chronic kidney disease, or unspecified chronic kidney disease: Secondary | ICD-10-CM | POA: Diagnosis not present

## 2022-10-10 DIAGNOSIS — Z79899 Other long term (current) drug therapy: Secondary | ICD-10-CM | POA: Diagnosis not present

## 2022-10-10 DIAGNOSIS — I509 Heart failure, unspecified: Secondary | ICD-10-CM | POA: Diagnosis not present

## 2022-10-10 DIAGNOSIS — Z9889 Other specified postprocedural states: Secondary | ICD-10-CM | POA: Diagnosis not present

## 2022-10-10 DIAGNOSIS — Z888 Allergy status to other drugs, medicaments and biological substances status: Secondary | ICD-10-CM | POA: Diagnosis not present

## 2022-10-10 DIAGNOSIS — Z886 Allergy status to analgesic agent status: Secondary | ICD-10-CM | POA: Diagnosis not present

## 2022-10-10 DIAGNOSIS — N170 Acute kidney failure with tubular necrosis: Secondary | ICD-10-CM | POA: Diagnosis not present

## 2022-10-10 DIAGNOSIS — Z880 Allergy status to penicillin: Secondary | ICD-10-CM | POA: Diagnosis not present

## 2022-10-10 DIAGNOSIS — Z8719 Personal history of other diseases of the digestive system: Secondary | ICD-10-CM | POA: Diagnosis not present

## 2022-12-16 DIAGNOSIS — Q388 Other congenital malformations of pharynx: Secondary | ICD-10-CM | POA: Insufficient documentation

## 2023-01-05 ENCOUNTER — Ambulatory Visit (INDEPENDENT_AMBULATORY_CARE_PROVIDER_SITE_OTHER): Payer: 59 | Admitting: Family Medicine

## 2023-01-05 ENCOUNTER — Encounter: Payer: Self-pay | Admitting: Family Medicine

## 2023-01-05 VITALS — BP 135/71 | HR 102 | Temp 97.8°F | Ht 63.0 in | Wt 176.2 lb

## 2023-01-05 DIAGNOSIS — G801 Spastic diplegic cerebral palsy: Secondary | ICD-10-CM

## 2023-01-05 DIAGNOSIS — E790 Hyperuricemia without signs of inflammatory arthritis and tophaceous disease: Secondary | ICD-10-CM

## 2023-01-05 DIAGNOSIS — I129 Hypertensive chronic kidney disease with stage 1 through stage 4 chronic kidney disease, or unspecified chronic kidney disease: Secondary | ICD-10-CM | POA: Diagnosis not present

## 2023-01-05 DIAGNOSIS — E785 Hyperlipidemia, unspecified: Secondary | ICD-10-CM | POA: Diagnosis not present

## 2023-01-05 DIAGNOSIS — N1832 Chronic kidney disease, stage 3b: Secondary | ICD-10-CM

## 2023-01-05 DIAGNOSIS — R7309 Other abnormal glucose: Secondary | ICD-10-CM

## 2023-01-05 DIAGNOSIS — I10 Essential (primary) hypertension: Secondary | ICD-10-CM

## 2023-01-05 LAB — CMP14+EGFR
ALT: 12 IU/L (ref 0–44)
AST: 19 IU/L (ref 0–40)
Albumin/Globulin Ratio: 2.6 — ABNORMAL HIGH (ref 1.2–2.2)
Albumin: 4.9 g/dL (ref 3.8–4.9)
Alkaline Phosphatase: 129 IU/L — ABNORMAL HIGH (ref 44–121)
BUN/Creatinine Ratio: 11 (ref 9–20)
BUN: 21 mg/dL (ref 6–24)
Bilirubin Total: 0.5 mg/dL (ref 0.0–1.2)
CO2: 23 mmol/L (ref 20–29)
Calcium: 9.2 mg/dL (ref 8.7–10.2)
Chloride: 100 mmol/L (ref 96–106)
Creatinine, Ser: 1.87 mg/dL — ABNORMAL HIGH (ref 0.76–1.27)
Globulin, Total: 1.9 g/dL (ref 1.5–4.5)
Glucose: 132 mg/dL — ABNORMAL HIGH (ref 70–99)
Potassium: 4.6 mmol/L (ref 3.5–5.2)
Sodium: 141 mmol/L (ref 134–144)
Total Protein: 6.8 g/dL (ref 6.0–8.5)
eGFR: 43 mL/min/{1.73_m2} — ABNORMAL LOW (ref >59)

## 2023-01-05 LAB — CBC WITH DIFFERENTIAL/PLATELET
Basophils Absolute: 0.1 10*3/uL (ref 0.0–0.2)
Basos: 1 %
EOS (ABSOLUTE): 0.8 10*3/uL — ABNORMAL HIGH (ref 0.0–0.4)
Eos: 11 %
Hematocrit: 41.3 % (ref 37.5–51.0)
Hemoglobin: 13.8 g/dL (ref 13.0–17.7)
Immature Grans (Abs): 0 10*3/uL (ref 0.0–0.1)
Immature Granulocytes: 0 %
Lymphocytes Absolute: 1.3 10*3/uL (ref 0.7–3.1)
Lymphs: 17 %
MCH: 31.4 pg (ref 26.6–33.0)
MCHC: 33.4 g/dL (ref 31.5–35.7)
MCV: 94 fL (ref 79–97)
Monocytes Absolute: 0.7 10*3/uL (ref 0.1–0.9)
Monocytes: 9 %
Neutrophils Absolute: 4.8 10*3/uL (ref 1.4–7.0)
Neutrophils: 62 %
Platelets: 112 10*3/uL — ABNORMAL LOW (ref 150–450)
RBC: 4.4 x10E6/uL (ref 4.14–5.80)
RDW: 12.6 % (ref 11.6–15.4)
WBC: 7.7 10*3/uL (ref 3.4–10.8)

## 2023-01-05 LAB — LIPID PANEL
Chol/HDL Ratio: 1.9 ratio (ref 0.0–5.0)
Cholesterol, Total: 130 mg/dL (ref 100–199)
HDL: 69 mg/dL (ref >39)
LDL Chol Calc (NIH): 45 mg/dL (ref 0–99)
Triglycerides: 80 mg/dL (ref 0–149)
VLDL Cholesterol Cal: 16 mg/dL (ref 5–40)

## 2023-01-05 LAB — BAYER DCA HB A1C WAIVED: HB A1C (BAYER DCA - WAIVED): 7.1 % — ABNORMAL HIGH (ref 4.8–5.6)

## 2023-01-05 NOTE — Progress Notes (Signed)
Subjective:  Patient ID: Bradley Graham,  male    DOB: 08-Nov-1971  Age: 52 y.o.    CC: Medical Management of Chronic Issues   HPI Bradley Graham presents for  follow-up of hypertension. Patient has no history of headache chest pain or shortness of breath or recent cough. Patient also denies symptoms of TIA such as numbness weakness lateralizing. Patient denies side effects from medication. States taking it regularly.  Patient also  in for follow-up of elevated cholesterol. Doing well without complaints on current medication. Denies side effects  including myalgia and arthralgia and nausea. Also in today for liver function testing. Currently no chest pain, shortness of breath or other cardiovascular related symptoms noted.  Follow-up of diabetes. Patient does check blood sugar at home. Readings run between 100-140 Patient denies symptoms such as excessive hunger or urinary frequency, excessive hunger, nausea No significant hypoglycemic spells noted. Medications reviewed. Pt reports taking them regularly. Pt. denies complication/adverse reaction today.    History Bradley Graham has a past medical history of Atrial fibrillation ([place]), CHF (congestive heart failure) ([place]), CKD (chronic kidney disease), Club foot of both feet, Hypertension, Left ventricular hypertrophy, Right ventricular hypertrophy, Speech impediment, Tetralogy of Fallot, and Vitamin D deficiency.   Bradley Graham has a past surgical history that includes VSD repair; scoliosis; Adenoidectomy; Mouth surgery; Wrist surgery (Left); and Abdominal hernia repair.   Bradley Graham family history includes Cancer in Bradley Graham cousin; Cancer (age of onset: 5) in Bradley Graham maternal grandfather; Diabetes in Bradley Graham father; Diabetes insipidus in Bradley Graham mother; Hyperlipidemia in Bradley Graham father and mother; Hypertension in Bradley Graham father; Hyperthyroidism in Bradley Graham mother and sister; Pernicious anemia in Bradley Graham maternal grandfather.Bradley Graham reports that Bradley Graham has never smoked. Bradley Graham has never used smokeless tobacco. Bradley Graham  reports that Bradley Graham does not drink alcohol and does not use drugs.  Current Outpatient Medications on File Prior to Visit  Medication Sig Dispense Refill   ACCU-CHEK GUIDE test strip TEST UP TO 4 TIMES DAILY AS DIRECTED. E11.9 400 strip 3   Accu-Chek Softclix Lancets lancets TEST BLOOD SUGAR 4 TIMES DAILY DX E11.9 400 each 3   allopurinol (ZYLOPRIM) 100 MG tablet Take 200 mg by mouth daily.     aspirin 81 MG tablet Take 81 mg by mouth daily.     atorvastatin (LIPITOR) 40 MG tablet Take 1 tablet (40 mg total) by mouth daily. For cholesterol 90 tablet 3   blood glucose meter kit and supplies KIT Dispense based on patient and insurance preference. Use up to four times daily as directed. (FOR ICD-10 : E11.9 1 each 11   Colchicine 0.6 MG CAPS Take 1 capsule by mouth every other day.     docusate sodium (COLACE) 100 MG capsule Take 100 mg by mouth daily.     famotidine (PEPCID) 10 MG tablet Take 1 tablet (10 mg total) by mouth 2 (two) times daily. 180 tablet 3   fexofenadine (ALLEGRA) 180 MG tablet Take by mouth.     fluticasone (FLONASE) 50 MCG/ACT nasal spray PLACE 1 SPRAY INTO BOTH NOSTRILS 2 (TWO) TIMES DAILY 48 mL 1   loratadine (CLARITIN) 10 MG tablet Take 1 tablet (10 mg total) by mouth daily. 90 tablet 1   olmesartan (BENICAR) 20 MG tablet Take 1 tablet (20 mg total) by mouth daily. 90 tablet 2   No current facility-administered medications on file prior to visit.    ROS Review of Systems  Constitutional:  Negative for fever.  Respiratory:  Negative for shortness of breath.  Cardiovascular:  Negative for chest pain.  Musculoskeletal:  Negative for arthralgias.  Skin:  Negative for rash.    Objective:  BP 135/71   Pulse (!) 102   Temp 97.8 F (36.6 C)   Ht 5\' 3"  (1.6 m)   Wt 176 lb 3.2 oz (79.9 kg)   SpO2 95%   BMI 31.21 kg/m   BP Readings from Last 3 Encounters:  01/05/23 135/71  10/03/22 (!) 144/75  06/13/22 122/73    Wt Readings from Last 3 Encounters:  01/05/23 176  lb 3.2 oz (79.9 kg)  10/03/22 175 lb 9.6 oz (79.7 kg)  06/13/22 172 lb (78 kg)     Physical Exam Vitals reviewed.  Constitutional:      Appearance: Bradley Graham well-developed.  HENT:     Head: Normocephalic and atraumatic.     Right Ear: External ear normal.     Left Ear: External ear normal.     Mouth/Throat:     Pharynx: No oropharyngeal exudate or posterior oropharyngeal erythema.  Eyes:     Pupils: Pupils are equal, round, and reactive to light.  Cardiovascular:     Rate and Rhythm: Normal rate and regular rhythm.     Heart sounds: No murmur heard. Pulmonary:     Effort: No respiratory distress.     Breath sounds: Normal breath sounds.  Musculoskeletal:     Cervical back: Normal range of motion and neck supple.  Neurological:     Mental Status: Bradley Graham alert and oriented to person, place, and time.     Diabetic Foot Exam - Simple   No data filed     Lab Results  Component Value Date   HGBA1C 7.1 (H) 01/05/2023   HGBA1C 6.4 (H) 10/03/2022   HGBA1C 6.3 (H) 06/13/2022    Assessment & Plan:   Bradley Graham was seen today for medical management of chronic issues.  Diagnoses and all orders for this visit:  Elevated hemoglobin A1c -     Bayer DCA Hb A1c Waived -     Microalbumin / creatinine urine ratio  Essential hypertension, benign -     CBC with Differential/Platelet -     CMP14+EGFR  Hyperlipidemia, unspecified hyperlipidemia type -     Lipid panel  Hyperuricemia -     Uric acid  Stage 3b chronic kidney disease (HCC)  Spastic cerebral palsy, congenital ([place])   I am having Bradley Graham maintain Bradley Graham docusate sodium, aspirin, loratadine, famotidine, blood glucose meter kit and supplies, allopurinol, Colchicine, fexofenadine, Accu-Chek Guide, Accu-Chek Softclix Lancets, fluticasone, olmesartan, and atorvastatin.  No orders of the defined types were placed in this encounter.    Follow-up: Return in about 3 months (around 04/05/2023).  [name], M.D.

## 2023-01-06 DIAGNOSIS — H903 Sensorineural hearing loss, bilateral: Secondary | ICD-10-CM | POA: Diagnosis not present

## 2023-01-06 DIAGNOSIS — Z8669 Personal history of other diseases of the nervous system and sense organs: Secondary | ICD-10-CM | POA: Diagnosis not present

## 2023-01-06 DIAGNOSIS — H6123 Impacted cerumen, bilateral: Secondary | ICD-10-CM | POA: Diagnosis not present

## 2023-01-07 LAB — URIC ACID: Uric Acid: 3 mg/dL — ABNORMAL LOW (ref 3.8–8.4)

## 2023-01-07 LAB — SPECIMEN STATUS REPORT

## 2023-01-08 LAB — MICROALBUMIN / CREATININE URINE RATIO
Creatinine, Urine: 105.2 mg/dL
Microalb/Creat Ratio: 31 mg/g creat — ABNORMAL HIGH (ref 0–29)
Microalbumin, Urine: 32.9 ug/mL

## 2023-01-08 NOTE — Progress Notes (Signed)
Hello [place],  Your lab result is normal and/or stable.Some minor variations that are not significant are commonly marked abnormal, but do not represent any medical problem for you.  Best regards, [name], M.D.

## 2023-01-11 ENCOUNTER — Telehealth: Payer: Self-pay | Admitting: Family Medicine

## 2023-01-11 DIAGNOSIS — D696 Thrombocytopenia, unspecified: Secondary | ICD-10-CM | POA: Diagnosis not present

## 2023-01-11 DIAGNOSIS — I10 Essential (primary) hypertension: Secondary | ICD-10-CM | POA: Diagnosis not present

## 2023-01-11 DIAGNOSIS — I130 Hypertensive heart and chronic kidney disease with heart failure and stage 1 through stage 4 chronic kidney disease, or unspecified chronic kidney disease: Secondary | ICD-10-CM | POA: Diagnosis not present

## 2023-01-11 DIAGNOSIS — Q213 Tetralogy of Fallot: Secondary | ICD-10-CM | POA: Diagnosis not present

## 2023-01-11 DIAGNOSIS — K469 Unspecified abdominal hernia without obstruction or gangrene: Secondary | ICD-10-CM | POA: Diagnosis not present

## 2023-01-11 DIAGNOSIS — Z8774 Personal history of (corrected) congenital malformations of heart and circulatory system: Secondary | ICD-10-CM | POA: Diagnosis not present

## 2023-01-11 DIAGNOSIS — R7303 Prediabetes: Secondary | ICD-10-CM | POA: Diagnosis not present

## 2023-01-11 DIAGNOSIS — N183 Chronic kidney disease, stage 3 unspecified: Secondary | ICD-10-CM | POA: Diagnosis not present

## 2023-01-11 NOTE — Telephone Encounter (Signed)
Patient's mother would like to discuss labs with nurse. Please call back.

## 2023-01-11 NOTE — Telephone Encounter (Signed)
Called mother and she reports she was able to get into MyChart to get the information she needed.

## 2023-01-12 DIAGNOSIS — M79672 Pain in left foot: Secondary | ICD-10-CM | POA: Diagnosis not present

## 2023-01-12 DIAGNOSIS — N181 Chronic kidney disease, stage 1: Secondary | ICD-10-CM | POA: Diagnosis not present

## 2023-01-12 DIAGNOSIS — M1A09X0 Idiopathic chronic gout, multiple sites, without tophus (tophi): Secondary | ICD-10-CM | POA: Diagnosis not present

## 2023-01-19 ENCOUNTER — Other Ambulatory Visit: Payer: Self-pay | Admitting: Family Medicine

## 2023-02-16 DIAGNOSIS — D631 Anemia in chronic kidney disease: Secondary | ICD-10-CM | POA: Diagnosis not present

## 2023-02-16 DIAGNOSIS — I129 Hypertensive chronic kidney disease with stage 1 through stage 4 chronic kidney disease, or unspecified chronic kidney disease: Secondary | ICD-10-CM | POA: Diagnosis not present

## 2023-02-16 DIAGNOSIS — E785 Hyperlipidemia, unspecified: Secondary | ICD-10-CM | POA: Diagnosis not present

## 2023-02-16 DIAGNOSIS — I272 Pulmonary hypertension, unspecified: Secondary | ICD-10-CM | POA: Diagnosis not present

## 2023-02-16 DIAGNOSIS — I10 Essential (primary) hypertension: Secondary | ICD-10-CM | POA: Diagnosis not present

## 2023-02-16 DIAGNOSIS — E1122 Type 2 diabetes mellitus with diabetic chronic kidney disease: Secondary | ICD-10-CM | POA: Diagnosis not present

## 2023-02-16 DIAGNOSIS — N1832 Chronic kidney disease, stage 3b: Secondary | ICD-10-CM | POA: Diagnosis not present

## 2023-02-16 DIAGNOSIS — N183 Chronic kidney disease, stage 3 unspecified: Secondary | ICD-10-CM | POA: Diagnosis not present

## 2023-02-16 DIAGNOSIS — M109 Gout, unspecified: Secondary | ICD-10-CM | POA: Diagnosis not present

## 2023-03-06 ENCOUNTER — Ambulatory Visit (INDEPENDENT_AMBULATORY_CARE_PROVIDER_SITE_OTHER): Payer: 59

## 2023-03-06 VITALS — Ht 63.0 in | Wt 176.0 lb

## 2023-03-06 DIAGNOSIS — Z0000 Encounter for general adult medical examination without abnormal findings: Secondary | ICD-10-CM

## 2023-03-06 NOTE — Patient Instructions (Signed)
[name] , Thank you for taking time to come for your Medicare Wellness Visit. I appreciate your ongoing commitment to your health goals. Please review the following plan we discussed and let me know if I can assist you in the future.   These are the goals we discussed:  Goals       Client will talk with LCSW in next 30 days about client interests in finding a job (pt-stated)      CARE PLAN ENTRY  Current Barriers:  Spastic Cerebral Palsy in client with Chronic diagnoses of Atrial Flutter, CKD, Vitamin D deficiency, HTN, HLD,  Speech challenges Mobility challenges  Clinical Social Work Clinical Goal(s):  LCSW will call client/Bradley Graham in next 30 days to talk about client  job interests  Interventions: Talked with Bradley Graham ,mother of client, about CCM program Talked with Bradley Graham about client's upcoming medical appointments Talked with Bradley Graham about client history of heart surgery (2005) Talked with Bradley Graham about client's past work history (received help through [employer]) Talked with Bradley Graham about client transportation She helps him with transport needs Talked with Bradley Graham about client appetite and snacks Talked with Bradley Graham about social support network for client  Talked with Bradley Graham about client relaxation techniques (plays mandolin, Chiropodist) Talked with Bradley Graham about RNCM ssupport with CCM program Talked with Bradley Graham about hearing needs of client  Patient Self Care Activities:   Uses exercise bike several times weekly Eats meals with set up assistance  Patient Self Care Deficits:  Speech challenges Spastic Cerebral Palsy  Initial goal documentation       Exercise 150 minutes per week (moderate activity)      Continue to exercise for at least 30 minutes daily      Manage My Emotions      Patient Stated      02/10/2020 AWV Goal: Exercise for General Health  Patient will verbalize understanding of the benefits of increased physical activity: Exercising regularly is  important. It will improve your overall fitness, flexibility, and endurance. Regular exercise also will improve your overall health. It can help you control your weight, reduce stress, and improve your bone density. Over the next year, patient will increase physical activity as tolerated with a goal of at least 150 minutes of moderate physical activity per week.  You can tell that you are exercising at a moderate intensity if your heart starts beating faster and you start breathing faster but can still hold a conversation. Moderate-intensity exercise ideas include: Walking 1 mile (1.6 km) in about 15 minutes Biking Hiking Golfing Dancing Water aerobics Patient will verbalize understanding of everyday activities that increase physical activity by providing examples like the following: Yard work, such as: Sales promotion account executive Gardening Washing windows or floors Patient will be able to explain general safety guidelines for exercising:  Before you start a new exercise program, talk with your health care provider. Do not exercise so much that you hurt yourself, feel dizzy, or get very short of breath. Wear comfortable clothes and wear shoes with good support. Drink plenty of water while you exercise to prevent dehydration or heat stroke. Work out until your breathing and your heartbeat get faster.       Protect My Health;Complete ADLs daily as able      Timeframe:  Short-Term Goal Priority:  Medium Progress: On Track Start Date:           02/21/22  Expected End Date:         05/19/22             Follow Up Date  04/18/22  at 2:00 PM   Protect My Health (Patient) Complete ADLs daily, as able    Why is this important?   Screening tests can find diseases early when they are easier to treat.  Your doctor or nurse will talk with you about which tests are important for you.  Getting shots for  common diseases like the flu and shingles will help prevent them.    Patient Coping Skills: Has strong support from his mother , Bradley Graham Completes ADLs independently Has hobbies her enjoys ( enjoys playing music)  Patient Deficits Hearing challenges Mobility challenges  Patient Goals:  In next 30 days: Client will attend all scheduled medical appointments Allow time for rest and relaxation for himself Talk regularly with his mother about his daily needs Take medications as prescribed. -  Follow Up Plan: LCSW to call client or his mother on 04/18/22 at 2:00 PM to assess client needs.          This is a list of the screening recommended for you and due dates:  Health Maintenance  Topic Date Due   COVID-19 Vaccine (3 - 2023-24 season) 08/05/2022   Colon Cancer Screening  03/06/2023*   Zoster (Shingles) Vaccine (1 of 2) 03/06/2023*   Flu Shot  07/06/2023   Medicare Annual Wellness Visit  03/05/2024   DTaP/Tdap/Td vaccine (2 - Td or Tdap) 02/06/2029   Hepatitis C Screening: USPSTF Recommendation to screen - Ages 15-79 yo.  Completed   HIV Screening  Completed   HPV Vaccine  Aged Out  *Topic was postponed. The date shown is not the original due date.    Advanced directives: Advance directive discussed with you today. I have provided a copy for you to complete at home and have notarized. Once this is complete please bring a copy in to our office so we can scan it into your chart.   Conditions/risks identified: Aim for 30 minutes of exercise or brisk walking, 6-8 glasses of water, and 5 servings of fruits and vegetables each day.   Next appointment: Follow up in one year for your annual wellness visit   Preventive Care 40-64 Years, Male Preventive care refers to lifestyle choices and visits with your health care provider that can promote health and wellness. What does preventive care include? A yearly physical exam. This is also called an annual well check. Dental exams  once or twice a year. Routine eye exams. Ask your health care provider how often you should have your eyes checked. Personal lifestyle choices, including: Daily care of your teeth and gums. Regular physical activity. Eating a healthy diet. Avoiding tobacco and drug use. Limiting alcohol use. Practicing safe sex. Taking low-dose aspirin every day starting at age 38. What happens during an annual well check? The services and screenings done by your health care provider during your annual well check will depend on your age, overall health, lifestyle risk factors, and family history of disease. Counseling  Your health care provider may ask you questions about your: Alcohol use. Tobacco use. Drug use. Emotional well-being. Home and relationship well-being. Sexual activity. Eating habits. Work and work Statistician. Screening  You may have the following tests or measurements: Height, weight, and BMI. Blood pressure. Lipid and cholesterol levels. These may be checked every 5 years, or more frequently if you are over 50  years old. Skin check. Lung cancer screening. You may have this screening every year starting at age 46 if you have a 30-pack-year history of smoking and currently smoke or have quit within the past 15 years. Fecal occult blood test (FOBT) of the stool. You may have this test every year starting at age 62. Flexible sigmoidoscopy or colonoscopy. You may have a sigmoidoscopy every 5 years or a colonoscopy every 10 years starting at age 61. Prostate cancer screening. Recommendations will vary depending on your family history and other risks. Hepatitis C blood test. Hepatitis B blood test. Sexually transmitted disease (STD) testing. Diabetes screening. This is done by checking your blood sugar (glucose) after you have not eaten for a while (fasting). You may have this done every 1-3 years. Discuss your test results, treatment options, and if necessary, the need for more tests  with your health care provider. Vaccines  Your health care provider may recommend certain vaccines, such as: Influenza vaccine. This is recommended every year. Tetanus, diphtheria, and acellular pertussis (Tdap, Td) vaccine. You may need a Td booster every 10 years. Zoster vaccine. You may need this after age 78. Pneumococcal 13-valent conjugate (PCV13) vaccine. You may need this if you have certain conditions and have not been vaccinated. Pneumococcal polysaccharide (PPSV23) vaccine. You may need one or two doses if you smoke cigarettes or if you have certain conditions. Talk to your health care provider about which screenings and vaccines you need and how often you need them. This information is not intended to replace advice given to you by your health care provider. Make sure you discuss any questions you have with your health care provider. Document Released: 12/18/2015 Document Revised: 08/10/2016 Document Reviewed: 09/22/2015 Elsevier Interactive Patient Education  2017 [place] Prevention in the Home Falls can cause injuries. They can happen to people of all ages. There are many things you can do to make your home safe and to help prevent falls. What can I do on the outside of my home? Regularly fix the edges of walkways and driveways and fix any cracks. Remove anything that might make you trip as you walk through a door, such as a raised step or threshold. Trim any bushes or trees on the path to your home. Use bright outdoor lighting. Clear any walking paths of anything that might make someone trip, such as rocks or tools. Regularly check to see if handrails are loose or broken. Make sure that both sides of any steps have handrails. Any raised decks and porches should have guardrails on the edges. Have any leaves, snow, or ice cleared regularly. Use sand or salt on walking paths during winter. Clean up any spills in your garage right away. This includes oil or grease  spills. What can I do in the bathroom? Use night lights. Install grab bars by the toilet and in the tub and shower. Do not use towel bars as grab bars. Use non-skid mats or decals in the tub or shower. If you need to sit down in the shower, use a plastic, non-slip stool. Keep the floor dry. Clean up any water that spills on the floor as soon as it happens. Remove soap buildup in the tub or shower regularly. Attach bath mats securely with double-sided non-slip rug tape. Do not have throw rugs and other things on the floor that can make you trip. What can I do in the bedroom? Use night lights. Make sure that you have a light by your  bed that is easy to reach. Do not use any sheets or blankets that are too big for your bed. They should not hang down onto the floor. Have a firm chair that has side arms. You can use this for support while you get dressed. Do not have throw rugs and other things on the floor that can make you trip. What can I do in the kitchen? Clean up any spills right away. Avoid walking on wet floors. Keep items that you use a lot in easy-to-reach places. If you need to reach something above you, use a strong step stool that has a grab bar. Keep electrical cords out of the way. Do not use floor polish or wax that makes floors slippery. If you must use wax, use non-skid floor wax. Do not have throw rugs and other things on the floor that can make you trip. What can I do with my stairs? Do not leave any items on the stairs. Make sure that there are handrails on both sides of the stairs and use them. Fix handrails that are broken or loose. Make sure that handrails are as long as the stairways. Check any carpeting to make sure that it is firmly attached to the stairs. Fix any carpet that is loose or worn. Avoid having throw rugs at the top or bottom of the stairs. If you do have throw rugs, attach them to the floor with carpet tape. Make sure that you have a light switch at the  top of the stairs and the bottom of the stairs. If you do not have them, ask someone to add them for you. What else can I do to help prevent falls? Wear shoes that: Do not have high heels. Have rubber bottoms. Are comfortable and fit you well. Are closed at the toe. Do not wear sandals. If you use a stepladder: Make sure that it is fully opened. Do not climb a closed stepladder. Make sure that both sides of the stepladder are locked into place. Ask someone to hold it for you, if possible. Clearly mark and make sure that you can see: Any grab bars or handrails. First and last steps. Where the edge of each step is. Use tools that help you move around (mobility aids) if they are needed. These include: Canes. Walkers. Scooters. Crutches. Turn on the lights when you go into a dark area. Replace any light bulbs as soon as they burn out. Set up your furniture so you have a clear path. Avoid moving your furniture around. If any of your floors are uneven, fix them. If there are any pets around you, be aware of where they are. Review your medicines with your doctor. Some medicines can make you feel dizzy. This can increase your chance of falling. Ask your doctor what other things that you can do to help prevent falls. This information is not intended to replace advice given to you by your health care provider. Make sure you discuss any questions you have with your health care provider. Document Released: 09/17/2009 Document Revised: 04/28/2016 Document Reviewed: 12/26/2014 Elsevier Interactive Patient Education  2017 [employer].

## 2023-03-06 NOTE — Progress Notes (Signed)
Subjective:   Bradley Graham is a 52 y.o. male who presents for Medicare Annual/Subsequent preventive examination. I connected with  Draper L Orner on 03/06/23 by a audio enabled telemedicine application and verified that I am speaking with the correct person using two identifiers.  Patient Location: Home  Provider Location: Home Office  I discussed the limitations of evaluation and management by telemedicine. The patient expressed understanding and agreed to proceed.  Review of Systems     Cardiac Risk Factors include: advanced age (>1men, >64 women);male gender;dyslipidemia;hypertension     Objective:    Today's Vitals   03/06/23 1421  Weight: 176 lb (79.8 kg)  Height: 5\' 3"  (1.6 m)   Body mass index is 31.18 kg/m.     03/06/2023    2:26 PM 02/28/2022    3:39 PM 02/22/2021   10:09 AM 02/10/2020    8:34 AM 02/07/2019   10:11 AM 06/27/2016   11:10 AM 05/06/2015   11:35 AM  Advanced Directives  Does Patient Have a Medical Advance Directive? No Yes No Yes No;Yes No Yes  Type of Environmental health practitioner Power of [place] in Chart?  No - copy requested   No - copy requested  No - copy requested  Would patient like information on creating a medical advance directive? No - Patient declined  No - Guardian declined  Yes (MAU/Ambulatory/Procedural Areas - Information given)      Current Medications (verified) Outpatient Encounter Medications as of 03/06/2023  Medication Sig   ACCU-CHEK GUIDE test strip TEST UP TO 4 TIMES DAILY AS DIRECTED. E11.9   Accu-Chek Softclix Lancets lancets TEST BLOOD SUGAR 4 TIMES DAILY DX E11.9   allopurinol (ZYLOPRIM) 100 MG tablet Take 200 mg by mouth daily.   aspirin 81 MG tablet Take 81 mg by mouth daily.   atorvastatin (LIPITOR) 40 MG tablet Take 1 tablet (40 mg total) by mouth daily. For cholesterol   blood glucose meter kit  and supplies KIT Dispense based on patient and insurance preference. Use up to four times daily as directed. (FOR ICD-10 : E11.9   Colchicine 0.6 MG CAPS Take 1 capsule by mouth every other day.   docusate sodium (COLACE) 100 MG capsule Take 100 mg by mouth daily.   famotidine (PEPCID) 10 MG tablet Take 1 tablet (10 mg total) by mouth 2 (two) times daily.   fexofenadine (ALLEGRA) 180 MG tablet Take by mouth.   fluticasone (FLONASE) 50 MCG/ACT nasal spray PLACE 1 SPRAY INTO BOTH NOSTRILS 2 (TWO) TIMES DAILY   loratadine (CLARITIN) 10 MG tablet Take 1 tablet (10 mg total) by mouth daily.   olmesartan (BENICAR) 20 MG tablet Take 1 tablet (20 mg total) by mouth daily.   No facility-administered encounter medications on file as of 03/06/2023.    Allergies (verified) Aleve [naproxen sodium], Bactrim [sulfamethoxazole-trimethoprim], Erythromycin, Heparin, Nsaids, Penicillins, and Cortisporin [bacitra-neomycin-polymyxin-hc]   History: Past Medical History:  Diagnosis Date   Atrial fibrillation    CHF (congestive heart failure)    CKD (chronic kidney disease)    Club foot of both feet    Hypertension    Left ventricular hypertrophy    Right ventricular hypertrophy    Speech impediment    Tetralogy of Fallot    s/p waterson shunt   Vitamin D deficiency    Past Surgical History:  Procedure Laterality Date  ABDOMINAL HERNIA REPAIR     [facility]    ADENOIDECTOMY     MOUTH SURGERY     scoliosis     VSD REPAIR     WRIST SURGERY Left    Family History  Problem Relation Age of Onset   Hyperlipidemia Mother    Hyperthyroidism Mother    Diabetes insipidus Mother    Diabetes Father    Hyperlipidemia Father    Hypertension Father    Hyperthyroidism Sister    Cancer Maternal Grandfather 47       breast cancer   Pernicious anemia Maternal Grandfather    Cancer Cousin        colon cance   Social History   Socioeconomic History   Marital status: Single    Spouse name: Not on file    Number of children: Not on file   Years of education: Not on file   Highest education level: 12th grade  Occupational History   Not on file  Tobacco Use   Smoking status: Never   Smokeless tobacco: Never  Vaping Use   Vaping Use: Never used  Substance and Sexual Activity   Alcohol use: No   Drug use: No   Sexual activity: Never  Other Topics Concern   Not on file  Social History Narrative   Single, lives with parents.   Social Determinants of Health   Financial Resource Strain: Low Risk  (03/06/2023)   Overall Financial Resource Strain (CARDIA)    Difficulty of Paying Living Expenses: Not hard at all  Food Insecurity: No Food Insecurity (03/06/2023)   Hunger Vital Sign    Worried About Running Out of Food in the Last Year: Never true    Ran Out of Food in the Last Year: Never true  Transportation Needs: No Transportation Needs (03/06/2023)   PRAPARE - Hydrologist (Medical): No    Lack of Transportation (Non-Medical): No  Physical Activity: Insufficiently Active (03/06/2023)   Exercise Vital Sign    Days of Exercise per Week: 3 days    Minutes of Exercise per Session: 30 min  Stress: No Stress Concern Present (03/06/2023)   [place]    Feeling of Stress : Not at all  Social Connections: Moderately Integrated (03/06/2023)   Social Connection and Isolation Panel [NHANES]    Frequency of Communication with Friends and Family: More than three times a week    Frequency of Social Gatherings with Friends and Family: More than three times a week    Attends Religious Services: More than 4 times per year    Active Member of [employer] or Organizations: Yes    Attends Music therapist: More than 4 times per year    Marital Status: Never married    Tobacco Counseling Counseling given: Not Answered   Clinical Intake:  Pre-visit preparation completed: Yes  Pain : No/denies pain      Nutritional Risks: None Diabetes: No  How often do you need to have someone help you when you read instructions, pamphlets, or other written materials from your doctor or pharmacy?: 1 - Never  Diabetic?no   Interpreter Needed?: No  Information entered by :: Jadene Pierini, LPN   Activities of Daily Living    03/06/2023    2:26 PM  In your present state of health, do you have any difficulty performing the following activities:  Hearing? 0  Vision? 0  Difficulty concentrating or making  decisions? 0  Walking or climbing stairs? 0  Dressing or bathing? 0  Doing errands, shopping? 0  Preparing Food and eating ? N  Using the Toilet? N  In the past six months, have you accidently leaked urine? N  Do you have problems with loss of bowel control? N  Managing your Medications? N  Managing your Finances? N  Housekeeping or managing your Housekeeping? N    Patient Care Team: [name], MD as PCP - General (Family Medicine) Wilfrid Lund, MD (Otolaryngology) Ophelia Shoulder, MD (Pulmonary Disease) Mercie Eon, MD (Internal Medicine) Rocco, Micheal Likens, MD as Referring Physician (Nephrology) Shea Evans [name], LCSW as Social Worker (Licensed Clinical Social Worker) Leafy Kindle, PA-C as Consulting Physician (Rheumatology)  Indicate any recent Medical Services you may have received from other than [facility] providers in the past year (date may be approximate).     Assessment:   This is a routine wellness examination for [place].  Hearing/Vision screen Vision Screening - Comments:: Wears rx glasses - up to date with routine eye exams with  Dr.Johnson   Dietary issues and exercise activities discussed: Current Exercise Habits: Home exercise routine, Type of exercise: walking, Time (Minutes): 30, Frequency (Times/Week): 5, Weekly Exercise (Minutes/Week): 150, Intensity: Mild, Exercise limited by: None identified   Goals Addressed   None    Depression Screen    03/06/2023     2:25 PM 01/05/2023   10:33 AM 01/05/2023   10:17 AM 10/03/2022    8:16 AM 06/13/2022   10:06 AM 06/13/2022   10:02 AM 06/08/2022   12:18 PM  PHQ 2/9 Scores  PHQ - 2 Score 0 4 0 0 1 0 0  PHQ- 9 Score 0 9   1  0    Fall Risk    03/06/2023    2:22 PM 01/05/2023   10:17 AM 10/03/2022    8:16 AM 06/13/2022   10:02 AM 06/08/2022   12:18 PM  Fall Risk   Falls in the past year? 0 0 0 1 1  Number falls in past yr: 0   0 0  Injury with Fall? 0   1 1  Risk for fall due to : No Fall Risks   History of fall(s)   Follow up Falls prevention discussed   Falls evaluation completed     [place]:  Any stairs in or around the home? Yes  If so, are there any without handrails? No  Home free of loose throw rugs in walkways, pet beds, electrical cords, etc? Yes  Adequate lighting in your home to reduce risk of falls? Yes   ASSISTIVE DEVICES UTILIZED TO PREVENT FALLS:  Life alert? No  Use of a cane, walker or w/c? No  Grab bars in the bathroom? No  Shower chair or bench in shower? No  Elevated toilet seat or a handicapped toilet? No       02/07/2019   10:22 AM 07/19/2017    9:33 AM 05/06/2015   11:57 AM  MMSE - Mini Mental State Exam  Not completed:   Unable to complete  Orientation to time 4 5   Orientation to Place 4 5   Registration 3 3   Attention/ Calculation 5 4   Recall 3 3   Language- name 2 objects 2 2   Language- repeat 1 1   Language- follow 3 step command 3 3   Language- read & follow direction 1 1   Write a  sentence 1 1   Copy design 1 0   Total score 28 28         02/22/2021   10:01 AM 02/10/2020    8:38 AM  6CIT Screen  What Year? 0 points 0 points  What month? 0 points 0 points  What time? 0 points 0 points  Count back from 20 0 points 0 points  Months in reverse 4 points 4 points  Repeat phrase 0 points 2 points  Total Score 4 points 6 points    Immunizations Immunization History  Administered Date(s) Administered   Influenza  Inj Mdck Quad Pf 09/19/2019   Influenza, High Dose Seasonal PF 11/12/2018   Influenza,inj,Quad PF,6+ Mos 09/29/2016, 08/10/2017   Influenza-Unspecified 10/20/2014, 10/08/2015, 11/12/2018, 10/05/2020   Janssen (J&J) SARS-COV-2 Vaccination 08/07/2020   Moderna Sars-Covid-2 Vaccination 10/21/2020   Tdap 02/07/2019    TDAP status: Up to date  Flu Vaccine status: Declined, Education has been provided regarding the importance of this vaccine but patient still declined. Advised may receive this vaccine at local pharmacy or Health Dept. Aware to provide a copy of the vaccination record if obtained from local pharmacy or Health Dept. Verbalized acceptance and understanding.  Pneumococcal vaccine status: Due, Education has been provided regarding the importance of this vaccine. Advised may receive this vaccine at local pharmacy or Health Dept. Aware to provide a copy of the vaccination record if obtained from local pharmacy or Health Dept. Verbalized acceptance and understanding.  Covid-19 vaccine status: Completed vaccines  Qualifies for Shingles Vaccine? No   Zostavax completed No   Shingrix Completed?: No.    Education has been provided regarding the importance of this vaccine. Patient has been advised to call insurance company to determine out of pocket expense if they have not yet received this vaccine. Advised may also receive vaccine at local pharmacy or Health Dept. Verbalized acceptance and understanding.  Screening Tests Health Maintenance  Topic Date Due   COVID-19 Vaccine (3 - 2023-24 season) 08/05/2022   COLONOSCOPY (Pts 45-44yrs Insurance coverage will need to be confirmed)  03/06/2023 (Originally 04/13/2016)   Zoster Vaccines- Shingrix (1 of 2) 03/06/2023 (Originally 04/13/1990)   INFLUENZA VACCINE  07/06/2023   Medicare Annual Wellness (AWV)  03/05/2024   DTaP/Tdap/Td (2 - Td or Tdap) 02/06/2029   Hepatitis C Screening  Completed   HIV Screening  Completed   HPV VACCINES  Aged Out     Health Maintenance  Health Maintenance Due  Topic Date Due   COVID-19 Vaccine (3 - 2023-24 season) 08/05/2022    Colorectal cancer screening: Referral to GI placed patient declines . Pt aware the office will call re: appt.  Lung Cancer Screening: (Low Dose CT Chest recommended if Age 32-80 years, 30 pack-year currently smoking OR have quit w/in 15years.) does not qualify.   Lung Cancer Screening Referral: n/a  Additional Screening:  Hepatitis C Screening: does not qualify; Completed 07/27/2020  Vision Screening: Recommended annual ophthalmology exams for early detection of glaucoma and other disorders of the eye. Is the patient up to date with their annual eye exam?  Yes  Who is the provider or what is the name of the office in which the patient attends annual eye exams? Dr.Johnson  If pt is not established with a provider, would they like to be referred to a provider to establish care? No .   Dental Screening: Recommended annual dental exams for proper oral hygiene  Community Resource Referral / Chronic Care Management: CRR required this  visit?  No   CCM required this visit?  No      Plan:     I have personally reviewed and noted the following in the patient's chart:   Medical and social history Use of alcohol, tobacco or illicit drugs  Current medications and supplements including opioid prescriptions. Patient is not currently taking opioid prescriptions. Functional ability and status Nutritional status Physical activity Advanced directives List of other physicians Hospitalizations, surgeries, and ER visits in previous 12 months Vitals Screenings to include cognitive, depression, and falls Referrals and appointments  In addition, I have reviewed and discussed with patient certain preventive protocols, quality metrics, and best practice recommendations. A written personalized care plan for preventive services as well as general preventive health recommendations  were provided to patient.     Daphane Shepherd, LPN   [id number]   Nurse Notes: [name] Patient mother assited with [place]

## 2023-06-02 ENCOUNTER — Encounter: Payer: Self-pay | Admitting: Family Medicine

## 2023-06-02 ENCOUNTER — Telehealth (INDEPENDENT_AMBULATORY_CARE_PROVIDER_SITE_OTHER): Payer: 59 | Admitting: Family Medicine

## 2023-06-02 DIAGNOSIS — R051 Acute cough: Secondary | ICD-10-CM | POA: Diagnosis not present

## 2023-06-02 DIAGNOSIS — R0981 Nasal congestion: Secondary | ICD-10-CM | POA: Diagnosis not present

## 2023-06-02 MED ORDER — AZITHROMYCIN 250 MG PO TABS
ORAL_TABLET | ORAL | 0 refills | Status: AC
Start: 2023-06-02 — End: 2023-06-07

## 2023-06-02 NOTE — Progress Notes (Signed)
Virtual Visit via Video Note  I [name] on 06/02/23 at  4:45 PM EDT by a video enabled telemedicine application and verified that I am speaking with the correct person using two identifiers.  Patient Location: Home Provider Location: Home Office  I discussed the limitations, risks, security, and privacy concerns of performing an evaluation and management service by video and the availability of in person appointments. I also discussed with the patient that there may be a patient responsible charge related to this service. The patient expressed understanding and agreed to proceed.  Subjective: PCP: Mechele Claude, MD  Chief Complaint  Patient presents with   Nasal Congestion   HPI Symptoms started Monday night with productive cough and congestion in sinuses.  Endorses runny nose. Denies sore throat fever. Has tried some medications that did not help. Denies ear pain. Endorses some slight trouble breathing.  Feels that he is worse at night. Having trouble sleeping. States that zpack has worked for him in the past.   ROS: Per HPI  Current Outpatient Medications:    ACCU-CHEK GUIDE test strip, TEST UP TO 4 TIMES DAILY AS DIRECTED. E11.9, Disp: 400 strip, Rfl: 3   Accu-Chek Softclix Lancets lancets, TEST BLOOD SUGAR 4 TIMES DAILY DX E11.9, Disp: 400 each, Rfl: 3   allopurinol (ZYLOPRIM) 100 MG tablet, Take 200 mg by mouth daily., Disp: , Rfl:    aspirin 81 MG tablet, Take 81 mg by mouth daily., Disp: , Rfl:    atorvastatin (LIPITOR) 40 MG tablet, Take 1 tablet (40 mg total) by mouth daily. For cholesterol, Disp: 90 tablet, Rfl: 3   blood glucose meter kit and supplies KIT, Dispense based on patient and insurance preference. Use up to four times daily as directed. (FOR ICD-10 : E11.9, Disp: 1 each, Rfl: 11   Colchicine 0.6 MG CAPS, Take 1 capsule by mouth every other day., Disp: , Rfl:    docusate sodium (COLACE) 100 MG capsule, Take 100 mg by mouth daily., Disp: ,  Rfl:    famotidine (PEPCID) 10 MG tablet, Take 1 tablet (10 mg total) by mouth 2 (two) times daily., Disp: 180 tablet, Rfl: 3   fexofenadine (ALLEGRA) 180 MG tablet, Take by mouth., Disp: , Rfl:    fluticasone (FLONASE) 50 MCG/ACT nasal spray, PLACE 1 SPRAY INTO BOTH NOSTRILS 2 (TWO) TIMES DAILY, Disp: 48 mL, Rfl: 1   loratadine (CLARITIN) 10 MG tablet, Take 1 tablet (10 mg total) by mouth daily., Disp: 90 tablet, Rfl: 1   olmesartan (BENICAR) 20 MG tablet, Take 1 tablet (20 mg total) by mouth daily., Disp: 90 tablet, Rfl: 2  Observations/Objective: There were no vitals filed for this visit. Physical Exam Constitutional:      General: He is awake. He is not in acute distress.    Appearance: Normal appearance. He is well-developed and well-groomed. He is not ill-appearing or toxic-appearing.  HENT:     Nose: Congestion and rhinorrhea present.  Eyes:     Extraocular Movements:     Left eye: Abnormal extraocular motion present.  Pulmonary:     [place]: No tachypnea or respiratory distress.  Neurological:     Mental Status: He is alert and easily aroused.  Psychiatric:        Attention and Perception: Attention normal.        Mood and Affect: Mood normal.        Speech: Speech is delayed.        Behavior: Behavior is  cooperative.    Assessment and Plan: 1. Acute cough Discussed with patient that symptoms are likely viral in etiology but given the weekend and comorbid conditions will prescribe pocket prescription of antibiotic as below. Instructed patient to force fluids and continue supportive therapies.  - azithromycin (ZITHROMAX) 250 MG tablet; Take 2 tablets on day 1, then 1 tablet daily on days 2 through 5  Dispense: 6 tablet; Refill: 0  2. Congestion of nasal sinus See above.  - azithromycin (ZITHROMAX) 250 MG tablet; Take 2 tablets on day 1, then 1 tablet daily on days 2 through 5  Dispense: 6 tablet; Refill: 0   Follow Up Instructions: Return if symptoms worsen or fail to  improve.   I discussed the assessment and treatment plan with the patient. The patient was provided an opportunity to ask questions, and all were answered. The patient agreed with the plan and demonstrated an understanding of the instructions.   The patient was advised to call back or seek an in-person evaluation if the symptoms worsen or if the condition fails to improve as anticipated.  The above assessment and management plan was discussed with the patient. The patient verbalized understanding of and has agreed to the management plan.   Neale Burly, DNP-FNP [facility] [address] [place], [place] [address] [phone]

## 2023-06-06 ENCOUNTER — Telehealth: Payer: 59 | Admitting: Family Medicine

## 2023-06-06 ENCOUNTER — Encounter: Payer: Self-pay | Admitting: Family Medicine

## 2023-06-06 DIAGNOSIS — J0100 Acute maxillary sinusitis, unspecified: Secondary | ICD-10-CM | POA: Diagnosis not present

## 2023-06-06 MED ORDER — LEVOFLOXACIN 500 MG PO TABS: 500.0000 mg | ORAL_TABLET | Freq: Every day | ORAL | 0 refills | Status: DC

## 2023-06-06 NOTE — Progress Notes (Signed)
Subjective:    Patient ID: Bradley Graham, male    DOB: 07/30/1971, 52 y.o.   MRN: [id number]   HPI: Bradley Graham is a 52 y.o. male presenting for ear running  at night in bed. Congested, coughing, drainage. Cough with purulent sputum. No fever or dyspnea.       03/06/2023    2:25 PM 01/05/2023   10:33 AM 01/05/2023   10:17 AM 10/03/2022    8:16 AM 06/13/2022   10:06 AM  Depression screen PHQ 2/9  Decreased Interest 0 3 0 0 1  Down, Depressed, Hopeless 0 1 0 0 0  PHQ - 2 Score 0 4 0 0 1  Altered sleeping 0 0   0  Tired, decreased energy 0 0   0  Change in appetite 0 1   0  Feeling bad or failure about yourself  0 1   0  Trouble concentrating 0 0   0  Moving slowly or fidgety/restless 0 3   0  Suicidal thoughts 0 0   0  PHQ-9 Score 0 9   1  Difficult doing work/chores Not difficult at all Not difficult at all   Not difficult at all     Relevant past medical, surgical, family and social history reviewed and updated as indicated.  Interim medical history since our last visit reviewed. Allergies and medications reviewed and updated.  ROS:  Review of Systems  Constitutional:  Negative for activity change, appetite change, chills and fever.  HENT:  Positive for congestion, postnasal drip, rhinorrhea and sinus pressure. Negative for ear discharge, ear pain, hearing loss, nosebleeds, sneezing and trouble swallowing.   Respiratory:  Negative for chest tightness and shortness of breath.   Cardiovascular:  Negative for chest pain and palpitations.  Skin:  Negative for rash.     Social History   Tobacco Use  Smoking Status Never  Smokeless Tobacco Never       Objective:     Wt Readings from Last 3 Encounters:  03/06/23 176 lb (79.8 kg)  01/05/23 176 lb 3.2 oz (79.9 kg)  10/03/22 175 lb 9.6 oz (79.7 kg)     Exam deferred. Video visit performed.   Assessment & Plan:   1. Acute maxillary sinusitis, recurrence not specified     Meds ordered this encounter   Medications   levofloxacin (LEVAQUIN) 500 MG tablet    Sig: Take 1 tablet (500 mg total) by mouth daily. For 10 days    Dispense:  10 tablet    Refill:  0    No orders of the defined types were placed in this encounter.     Diagnoses and all orders for this visit:  Acute maxillary sinusitis, recurrence not specified  Other orders -     levofloxacin (LEVAQUIN) 500 MG tablet; Take 1 tablet (500 mg total) by mouth daily. For 10 days    Virtual Visit I discussed the limitations, risks, security and privacy concerns of performing an evaluation and management service by video and the availability of in person appointments. The patient was identified with two identifiers. Pt.expressed understanding and agreed to proceed. Pt. Is at home. Dr. Darlyn Read is in his office.  Follow Up Instructions:   I discussed the assessment and treatment plan with the patient. The patient was provided an opportunity to ask questions and all were answered. The patient agreed with the plan and demonstrated an understanding of the instructions.   The patient was advised to call  back or seek an in-person evaluation if the symptoms worsen or if the condition fails to improve as anticipated.   Total minutes including chart review and phone contact time: 12   Follow up plan: Return if symptoms worsen or fail to improve.  Bradley Fraise, MD [place]

## 2023-06-21 ENCOUNTER — Ambulatory Visit (INDEPENDENT_AMBULATORY_CARE_PROVIDER_SITE_OTHER): Payer: 59

## 2023-06-21 ENCOUNTER — Ambulatory Visit (INDEPENDENT_AMBULATORY_CARE_PROVIDER_SITE_OTHER): Payer: 59 | Admitting: Family Medicine

## 2023-06-21 VITALS — BP 139/87 | HR 104 | Temp 97.8°F | Wt 166.6 lb

## 2023-06-21 DIAGNOSIS — R062 Wheezing: Secondary | ICD-10-CM

## 2023-06-21 DIAGNOSIS — R051 Acute cough: Secondary | ICD-10-CM

## 2023-06-21 DIAGNOSIS — R059 Cough, unspecified: Secondary | ICD-10-CM | POA: Diagnosis not present

## 2023-06-21 DIAGNOSIS — R0602 Shortness of breath: Secondary | ICD-10-CM | POA: Diagnosis not present

## 2023-06-21 MED ORDER — BUDESONIDE 1 MG/2ML IN SUSP: 1.0000 mg | Freq: Every day | RESPIRATORY_TRACT | 12 refills | Status: DC

## 2023-06-21 MED ORDER — FORMOTEROL FUMARATE 20 MCG/2ML IN NEBU: 20.0000 ug | INHALATION_SOLUTION | Freq: Two times a day (BID) | RESPIRATORY_TRACT | 1 refills | Status: AC

## 2023-06-21 MED ORDER — CHERATUSSIN AC 100-10 MG/5ML PO SOLN: 5.0000 mL | ORAL | 0 refills | Status: DC | PRN
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
## 2023-06-22 ENCOUNTER — Encounter: Payer: Self-pay | Admitting: Family Medicine

## 2023-06-22 MED ORDER — HYDROCODONE BIT-HOMATROP MBR 5-1.5 MG/5ML PO SOLN: 5.0000 mL | Freq: Four times a day (QID) | ORAL | 0 refills | Status: AC | PRN

## 2023-06-22 MED ORDER — MOXIFLOXACIN HCL 400 MG PO TABS: 400.0000 mg | ORAL_TABLET | Freq: Every day | ORAL | 0 refills | Status: DC

## 2023-06-22 NOTE — Telephone Encounter (Signed)
Mother says that Dr Darlyn Read told her that abx was going to be sent in to [facility]. Mother wants nurse to check with [facility] about this. Please call back

## 2023-06-29 NOTE — Progress Notes (Signed)
Your chest x-ray looked normal. Thanks, WS.

## 2023-07-10 ENCOUNTER — Ambulatory Visit (INDEPENDENT_AMBULATORY_CARE_PROVIDER_SITE_OTHER): Payer: 59 | Admitting: Family Medicine

## 2023-07-10 ENCOUNTER — Encounter: Payer: Self-pay | Admitting: Family Medicine

## 2023-07-10 VITALS — BP 119/65 | HR 98 | Temp 97.7°F | Ht 63.0 in | Wt 165.2 lb

## 2023-07-10 DIAGNOSIS — M4104 Infantile idiopathic scoliosis, thoracic region: Secondary | ICD-10-CM | POA: Diagnosis not present

## 2023-07-10 DIAGNOSIS — I10 Essential (primary) hypertension: Secondary | ICD-10-CM

## 2023-07-10 DIAGNOSIS — R7309 Other abnormal glucose: Secondary | ICD-10-CM

## 2023-07-10 DIAGNOSIS — E785 Hyperlipidemia, unspecified: Secondary | ICD-10-CM

## 2023-07-10 DIAGNOSIS — R479 Unspecified speech disturbances: Secondary | ICD-10-CM | POA: Diagnosis not present

## 2023-07-10 DIAGNOSIS — G801 Spastic diplegic cerebral palsy: Secondary | ICD-10-CM

## 2023-07-10 LAB — CBC WITH DIFFERENTIAL/PLATELET

## 2023-07-10 LAB — BAYER DCA HB A1C WAIVED: HB A1C (BAYER DCA - WAIVED): 6.6 % — ABNORMAL HIGH (ref 4.8–5.6)

## 2023-07-10 LAB — CMP14+EGFR
ALT: 11 IU/L (ref 0–44)
AST: 20 IU/L (ref 0–40)
Albumin: 4.4 g/dL (ref 3.8–4.9)
Alkaline Phosphatase: 119 IU/L (ref 44–121)
BUN/Creatinine Ratio: 13 (ref 9–20)
BUN: 28 mg/dL — ABNORMAL HIGH (ref 6–24)
Bilirubin Total: 0.4 mg/dL (ref 0.0–1.2)
CO2: 20 mmol/L (ref 20–29)
Calcium: 9.4 mg/dL (ref 8.7–10.2)
Chloride: 104 mmol/L (ref 96–106)
Creatinine, Ser: 2.18 mg/dL — ABNORMAL HIGH (ref 0.76–1.27)
Globulin, Total: 1.8 g/dL (ref 1.5–4.5)
Glucose: 119 mg/dL — ABNORMAL HIGH (ref 70–99)
Potassium: 5.2 mmol/L (ref 3.5–5.2)
Sodium: 142 mmol/L (ref 134–144)
Total Protein: 6.2 g/dL (ref 6.0–8.5)
eGFR: 36 mL/min/{1.73_m2} — ABNORMAL LOW (ref >59)

## 2023-07-10 LAB — LIPID PANEL
Chol/HDL Ratio: 1.9 ratio (ref 0.0–5.0)
Cholesterol, Total: 122 mg/dL (ref 100–199)
HDL: 63 mg/dL (ref >39)
LDL Chol Calc (NIH): 45 mg/dL (ref 0–99)
Triglycerides: 64 mg/dL (ref 0–149)
VLDL Cholesterol Cal: 14 mg/dL (ref 5–40)

## 2023-07-10 MED ORDER — BLOOD GLUCOSE TEST VI STRP: 1.0000 | ORAL_STRIP | Freq: Three times a day (TID) | 0 refills | Status: DC

## 2023-07-10 MED ORDER — LINAGLIPTIN 5 MG PO TABS: 5.0000 mg | ORAL_TABLET | Freq: Every day | ORAL | 5 refills | Status: DC

## 2023-07-10 MED ORDER — BLOOD GLUCOSE MONITORING SUPPL DEVI: 1.0000 | Freq: Three times a day (TID) | 0 refills | Status: DC

## 2023-07-10 MED ORDER — LANCETS MISC. MISC: 1.0000 | Freq: Three times a day (TID) | 0 refills | Status: AC

## 2023-07-10 MED ORDER — BENZONATATE 200 MG PO CAPS: 200.0000 mg | ORAL_CAPSULE | Freq: Three times a day (TID) | ORAL | 3 refills | Status: DC | PRN

## 2023-07-10 MED ORDER — LANCET DEVICE MISC: 1.0000 | Freq: Three times a day (TID) | 0 refills | Status: AC
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
## 2023-07-11 NOTE — Progress Notes (Signed)
Hello Kennith,  Your lab result is normal and/or stable.Some minor variations that are not significant are commonly marked abnormal, but do not represent any medical problem for you.  Best regards, [name], M.D.

## 2023-07-20 ENCOUNTER — Other Ambulatory Visit: Payer: Self-pay | Admitting: Family Medicine

## 2023-07-20 DIAGNOSIS — M79672 Pain in left foot: Secondary | ICD-10-CM | POA: Diagnosis not present

## 2023-07-20 DIAGNOSIS — N181 Chronic kidney disease, stage 1: Secondary | ICD-10-CM | POA: Diagnosis not present

## 2023-07-20 DIAGNOSIS — M1A09X0 Idiopathic chronic gout, multiple sites, without tophus (tophi): Secondary | ICD-10-CM | POA: Diagnosis not present

## 2023-07-27 DIAGNOSIS — H6123 Impacted cerumen, bilateral: Secondary | ICD-10-CM | POA: Diagnosis not present

## 2023-07-28 ENCOUNTER — Other Ambulatory Visit: Payer: Self-pay | Admitting: Family Medicine

## 2023-07-28 DIAGNOSIS — N189 Chronic kidney disease, unspecified: Secondary | ICD-10-CM

## 2023-08-05 ENCOUNTER — Other Ambulatory Visit: Payer: Self-pay | Admitting: Family Medicine

## 2023-08-09 ENCOUNTER — Other Ambulatory Visit: Payer: Self-pay | Admitting: Family Medicine

## 2023-08-13 ENCOUNTER — Other Ambulatory Visit: Payer: Self-pay | Admitting: Family Medicine

## 2023-08-14 ENCOUNTER — Ambulatory Visit: Payer: 59 | Admitting: Family Medicine

## 2023-08-17 ENCOUNTER — Ambulatory Visit (INDEPENDENT_AMBULATORY_CARE_PROVIDER_SITE_OTHER): Payer: 59 | Admitting: Family Medicine

## 2023-08-17 ENCOUNTER — Encounter: Payer: Self-pay | Admitting: Family Medicine

## 2023-08-17 VITALS — BP 120/69 | HR 100 | Temp 97.8°F | Ht 63.0 in | Wt 161.4 lb

## 2023-08-17 DIAGNOSIS — Z7984 Long term (current) use of oral hypoglycemic drugs: Secondary | ICD-10-CM

## 2023-08-17 DIAGNOSIS — Z23 Encounter for immunization: Secondary | ICD-10-CM | POA: Diagnosis not present

## 2023-08-17 DIAGNOSIS — M4185 Other forms of scoliosis, thoracolumbar region: Secondary | ICD-10-CM | POA: Diagnosis not present

## 2023-08-17 DIAGNOSIS — E119 Type 2 diabetes mellitus without complications: Secondary | ICD-10-CM | POA: Diagnosis not present
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
## 2023-08-18 ENCOUNTER — Telehealth: Payer: Self-pay | Admitting: Family Medicine

## 2023-08-18 NOTE — Telephone Encounter (Signed)
Check with Mom (caregiver) Do they want MRI lumbar Thoracic or both, or other?

## 2023-08-21 ENCOUNTER — Encounter: Payer: Self-pay | Admitting: Family Medicine

## 2023-08-21 ENCOUNTER — Other Ambulatory Visit: Payer: Self-pay | Admitting: Family Medicine

## 2023-08-21 DIAGNOSIS — N1832 Chronic kidney disease, stage 3b: Secondary | ICD-10-CM | POA: Diagnosis not present

## 2023-08-21 DIAGNOSIS — M4185 Other forms of scoliosis, thoracolumbar region: Secondary | ICD-10-CM | POA: Insufficient documentation

## 2023-08-21 NOTE — Telephone Encounter (Signed)
MRI's have ben ordered. Please tell Mom

## 2023-08-21 NOTE — Telephone Encounter (Signed)
COMPLETE SPINE

## 2023-08-28 ENCOUNTER — Telehealth (INDEPENDENT_AMBULATORY_CARE_PROVIDER_SITE_OTHER): Payer: 59 | Admitting: Nurse Practitioner

## 2023-08-28 ENCOUNTER — Encounter: Payer: Self-pay | Admitting: Nurse Practitioner

## 2023-08-28 DIAGNOSIS — U071 COVID-19: Secondary | ICD-10-CM

## 2023-08-28 MED ORDER — NIRMATRELVIR/RITONAVIR (PAXLOVID)TABLET: 3.0000 | ORAL_TABLET | Freq: Two times a day (BID) | ORAL | 0 refills | Status: AC

## 2023-08-28 MED ORDER — MOLNUPIRAVIR EUA 200MG CAPSULE
4.0000 | ORAL_CAPSULE | Freq: Two times a day (BID) | ORAL | 0 refills | Status: AC
Start: 2023-08-28 — End: 2023-09-02

## 2023-08-28 MED ORDER — NIRMATRELVIR/RITONAVIR (PAXLOVID)TABLET
3.0000 | ORAL_TABLET | Freq: Two times a day (BID) | ORAL | 0 refills | Status: DC
Start: 2023-08-28 — End: 2023-08-28

## 2023-08-28 MED ORDER — PROMETHAZINE-DM 6.25-15 MG/5ML PO SYRP
5.0000 mL | ORAL_SOLUTION | Freq: Four times a day (QID) | ORAL | 0 refills | Status: DC | PRN
Start: 2023-08-28 — End: 2023-10-30

## 2023-08-28 NOTE — Progress Notes (Signed)
Virtual Visit Consent   Bradley Graham, you are scheduled for a virtual visit with Bradley Daphine Deutscher, FNP, a [facility] provider, today.     Just as with appointments in the office, your consent must be obtained to participate.  Your consent will be active for this visit and any virtual visit you may have with one of our providers in the next 365 days.     If you have a MyChart account, a copy of this consent can be sent to you electronically.  All virtual visits are billed to your insurance company just like a traditional visit in the office.    As this is a virtual visit, video technology does not allow for your provider to perform a traditional examination.  This may limit your provider's ability to fully assess your condition.  If your provider identifies any concerns that need to be evaluated in person or the need to arrange testing (such as labs, EKG, etc.), we will make arrangements to do so.     Although advances in technology are sophisticated, we cannot ensure that it will always work on either your end or our end.  If the connection with a video visit is poor, the visit may have to be switched to a telephone visit.  With either a video or telephone visit, we are not always able to ensure that we have a secure connection.     I need to obtain your verbal consent now.   Are you willing to proceed with your visit today? YES   Bradley Graham has provided verbal consent on 08/28/2023 for a virtual visit (video or telephone).   Bradley Daphine Deutscher, FNP   Date: 08/28/2023 11:13 AM   Virtual Visit via Video Note   I, Bradley Graham, [name] ([id number], 02-Jan-1971) on 08/28/23 at 10:30 AM EDT by a video-enabled telemedicine application and verified that I am speaking with the correct person using two identifiers.  Location: Patient: Virtual Visit Location Patient: Home Provider: Virtual Visit Location Provider: Mobile   I discussed the limitations of  evaluation and management by telemedicine and the availability of in person appointments. The patient expressed understanding and agreed to proceed.    History of Present Illness: Bradley Graham is a 52 y.o. who identifies as a male who was assigned male at birth, and is being seen today for covid positive.  HPI: URI  This is a new problem. The current episode started yesterday. The problem has been waxing and waning. There has been no fever. Associated symptoms include congestion, coughing, headaches, rhinorrhea and a sore throat. He has tried acetaminophen for the symptoms. The treatment provided mild relief.    Review of Systems  HENT:  Positive for congestion, rhinorrhea and sore throat.   Respiratory:  Positive for cough.   Neurological:  Positive for headaches.    Problems:  Patient Active Problem List   Diagnosis Date Noted   Dextroscoliosis of thoracolumbar spine 08/21/2023   Stage 3b chronic kidney disease (HCC) 02/28/2022   S/P surgical pulmonary valve replacement 07/23/2021   Essential hypertension, benign 05/02/2017   Hyperlipidemia 05/02/2017   Posterior tibialis tendon insufficiency 02/23/2015   Overweight (BMI 25.0-29.9) 10/20/2014   Incisional hernia, without obstruction or gangrene 02/28/2014   Congenital talipes equinovarus deformity of both feet 06/25/2013   Vitamin D deficiency 06/25/2013   CKD (chronic kidney disease) 06/25/2013   S/P TOF (tetralogy of Fallot) repair 06/25/2013   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 06/25/2013   Spastic cerebral palsy, congenital (HCC) 01/30/2013   Tetralogy of Fallot with pulmonary atresia 08/08/2012   Supraventricular tachycardia 12/15/2011   Atrial flutter (HCC) 12/15/2011    Allergies:  Allergies  Allergen Reactions   Aleve [Naproxen Sodium]    Bactrim [Sulfamethoxazole-Trimethoprim]    Erythromycin    Heparin     Induced thrombopenia   Nsaids    Penicillins    Cortisporin [Bacitra-Neomycin-Polymyxin-Hc]  Other (See Comments)    Burned ear canal per mom   Medications:  Current Outpatient Medications:    Accu-Chek Softclix Lancets lancets, CHECK SUGAR IN THE MORNING, AT NOON, AND AT BEDTIME DX E11.9, Disp: 300 each, Rfl: 3   allopurinol (ZYLOPRIM) 100 MG tablet, Take 200 mg by mouth daily., Disp: , Rfl:    aspirin 81 MG tablet, Take 81 mg by mouth daily., Disp: , Rfl:    atorvastatin (LIPITOR) 40 MG tablet, Take 1 tablet (40 mg total) by mouth daily. For cholesterol, Disp: 90 tablet, Rfl: 3   benzonatate (TESSALON) 200 MG capsule, Take 1 capsule (200 mg total) by mouth 3 (three) times daily as needed for cough., Disp: 20 capsule, Rfl: 3   blood glucose meter kit and supplies KIT, Dispense based on patient and insurance preference. Use up to four times daily as directed. (FOR ICD-10 : E11.9, Disp: 1 each, Rfl: 11   Blood Glucose Monitoring Suppl (ACCU-CHEK GUIDE ME) w/Device KIT, CHECK SUGAR IN THE MORNING, AT NOON, AND AT BEDTIME DX E11.9, Disp: 1 kit, Rfl: 0   budesonide (PULMICORT) 1 MG/2ML nebulizer solution, Take 2 mLs (1 mg total) by nebulization daily., Disp: 60 mL, Rfl: 12   Colchicine 0.6 MG CAPS, Take 1 capsule by mouth every other day., Disp: , Rfl:    docusate sodium (COLACE) 100 MG capsule, Take 100 mg by mouth daily., Disp: , Rfl:    famotidine (PEPCID) 10 MG tablet, Take 1 tablet (10 mg total) by mouth 2 (two) times daily., Disp: 180 tablet, Rfl: 3   fexofenadine (ALLEGRA) 180 MG tablet, Take by mouth., Disp: , Rfl:    fluticasone (FLONASE) 50 MCG/ACT nasal spray, PLACE 1 SPRAY INTO BOTH NOSTRILS 2 (TWO) TIMES DAILY, Disp: 48 mL, Rfl: 1   formoterol (PERFOROMIST) 20 MCG/2ML nebulizer solution, Take 2 mLs (20 mcg total) by nebulization 2 (two) times daily., Disp: 120 mL, Rfl: 1   glucose blood (ACCU-CHEK GUIDE) test strip, CHECK SUGAR IN THE MORNING, AT NOON, AND AT BEDTIME DX E11.9, Disp: 300 strip, Rfl: 3   linagliptin (TRADJENTA) 5 MG TABS tablet, Take 1 tablet (5 mg total) by mouth  daily. For diabetes, Disp: 30 tablet, Rfl: 5   loratadine (CLARITIN) 10 MG tablet, Take 1 tablet (10 mg total) by mouth daily., Disp: 90 tablet, Rfl: 1   olmesartan (BENICAR) 20 MG tablet, TAKE 1 TABLET BY MOUTH EVERY DAY, Disp: 90 tablet, Rfl: 1  Observations/Objective: Patient is well-developed, well-nourished in no acute distress.  Resting comfortably  at home.  Head is normocephalic, atraumatic.  No labored breathing.  Speech is clear and coherent with logical content.  Patient is alert and oriented at baseline.  Raspy voice Wet cough  Assessment and Plan:  Bradley Graham in today with chief complaint of No chief complaint on file.   1. Positive self-administered antigen test for COVID-19 1. Take meds as prescribed 2. Use a cool mist humidifier especially during the winter months and when heat has been humid. 3. Use saline nose sprays frequently  4. Saline irrigations of the nose can be very helpful if done frequently.  * 4X daily for 1 week*  * Use of a nettie pot can be helpful with this. Follow directions with this* 5. Drink plenty of fluids 6. Keep thermostat turn down low 7.For any cough or congestion- promethazine DM 8. For fever or aces or pains- take tylenol or ibuprofen appropriate for age and weight.  * for fevers greater than 101 orally you may alternate ibuprofen and tylenol every  3 hours.   Meds ordered this encounter  Medications   nirmatrelvir/ritonavir (PAXLOVID) 20 x 150 MG & 10 x 100MG  TABS    Sig: Take 3 tablets by mouth 2 (two) times daily for 5 days. (Take nirmatrelvir 150 mg two tablets twice daily for 5 days and ritonavir 100 mg one tablet twice daily for 5 days) Patient GFR is 78    Dispense:  30 tablet    Refill:  0    Order Specific Question:   Supervising Provider    Answer:   Arville Care A [[id number]]   promethazine-dextromethorphan (PROMETHAZINE-DM) 6.25-15 MG/5ML syrup    Sig: Take 5 mLs by mouth 4 (four) times daily as needed for cough.     Dispense:  118 mL    Refill:  0    Order Specific Question:   Supervising Provider    Answer:   Arville Care A [[id number]]       Follow Up Instructions: I discussed the assessment and treatment plan with the patient. The patient was provided an opportunity to ask questions and all were answered. The patient agreed with the plan and demonstrated an understanding of the instructions.  A copy of instructions were sent to the patient via MyChart.  The patient was advised to call back or seek an in-person evaluation if the symptoms worsen or if the condition fails to improve as anticipated.  Time:  I spent 8 minutes with the patient via telehealth technology discussing the above problems/concerns.    Bradley Daphine Deutscher, FNP

## 2023-08-28 NOTE — Patient Instructions (Signed)
Bradley Graham, thank you for joining [name], FNP for today's virtual visit.  While this provider is not your primary care provider (PCP), if your PCP is located in our provider database this encounter information will be shared with them immediately following your visit.   A [facility] MyChart account gives you access to today's visit and all your visits, tests, and labs performed at [facility] " click here if you don't have a [facility] MyChart account or go to [email or web address]  Consent: (Patient) Bradley Graham provided verbal consent for this virtual visit at the beginning of the encounter.  Current Medications:  Current Outpatient Medications:    nirmatrelvir/ritonavir (PAXLOVID) 20 x 150 MG & 10 x 100MG  TABS, Take 3 tablets by mouth 2 (two) times daily for 5 days. (Take nirmatrelvir 150 mg two tablets twice daily for 5 days and ritonavir 100 mg one tablet twice daily for 5 days) Patient GFR is 78, Disp: 30 tablet, Rfl: 0   promethazine-dextromethorphan (PROMETHAZINE-DM) 6.25-15 MG/5ML syrup, Take 5 mLs by mouth 4 (four) times daily as needed for cough., Disp: 118 mL, Rfl: 0   Accu-Chek Softclix Lancets lancets, CHECK SUGAR IN THE MORNING, AT NOON, AND AT BEDTIME DX E11.9, Disp: 300 each, Rfl: 3   allopurinol (ZYLOPRIM) 100 MG tablet, Take 200 mg by mouth daily., Disp: , Rfl:    aspirin 81 MG tablet, Take 81 mg by mouth daily., Disp: , Rfl:    atorvastatin (LIPITOR) 40 MG tablet, Take 1 tablet (40 mg total) by mouth daily. For cholesterol, Disp: 90 tablet, Rfl: 3   benzonatate (TESSALON) 200 MG capsule, Take 1 capsule (200 mg total) by mouth 3 (three) times daily as needed for cough., Disp: 20 capsule, Rfl: 3   blood glucose meter kit and supplies KIT, Dispense based on patient and insurance preference. Use up to four times daily as directed. (FOR ICD-10 : E11.9, Disp: 1 each, Rfl: 11   Blood Glucose Monitoring Suppl (ACCU-CHEK GUIDE ME) w/Device KIT,  CHECK SUGAR IN THE MORNING, AT NOON, AND AT BEDTIME DX E11.9, Disp: 1 kit, Rfl: 0   budesonide (PULMICORT) 1 MG/2ML nebulizer solution, Take 2 mLs (1 mg total) by nebulization daily., Disp: 60 mL, Rfl: 12   Colchicine 0.6 MG CAPS, Take 1 capsule by mouth every other day., Disp: , Rfl:    docusate sodium (COLACE) 100 MG capsule, Take 100 mg by mouth daily., Disp: , Rfl:    famotidine (PEPCID) 10 MG tablet, Take 1 tablet (10 mg total) by mouth 2 (two) times daily., Disp: 180 tablet, Rfl: 3   fexofenadine (ALLEGRA) 180 MG tablet, Take by mouth., Disp: , Rfl:    fluticasone (FLONASE) 50 MCG/ACT nasal spray, PLACE 1 SPRAY INTO BOTH NOSTRILS 2 (TWO) TIMES DAILY, Disp: 48 mL, Rfl: 1   formoterol (PERFOROMIST) 20 MCG/2ML nebulizer solution, Take 2 mLs (20 mcg total) by nebulization 2 (two) times daily., Disp: 120 mL, Rfl: 1   glucose blood (ACCU-CHEK GUIDE) test strip, CHECK SUGAR IN THE MORNING, AT NOON, AND AT BEDTIME DX E11.9, Disp: 300 strip, Rfl: 3   linagliptin (TRADJENTA) 5 MG TABS tablet, Take 1 tablet (5 mg total) by mouth daily. For diabetes, Disp: 30 tablet, Rfl: 5   loratadine (CLARITIN) 10 MG tablet, Take 1 tablet (10 mg total) by mouth daily., Disp: 90 tablet, Rfl: 1   olmesartan (BENICAR) 20 MG tablet, TAKE 1 TABLET BY MOUTH EVERY DAY, Disp: 90 tablet, Rfl: 1   Medications ordered  in this encounter:  Meds ordered this encounter  Medications   nirmatrelvir/ritonavir (PAXLOVID) 20 x 150 MG & 10 x 100MG  TABS    Sig: Take 3 tablets by mouth 2 (two) times daily for 5 days. (Take nirmatrelvir 150 mg two tablets twice daily for 5 days and ritonavir 100 mg one tablet twice daily for 5 days) Patient GFR is 78    Dispense:  30 tablet    Refill:  0    Order Specific Question:   Supervising Provider    Answer:   Arville Care A [[id number]]   promethazine-dextromethorphan (PROMETHAZINE-DM) 6.25-15 MG/5ML syrup    Sig: Take 5 mLs by mouth 4 (four) times daily as needed for cough.    Dispense:  118  mL    Refill:  0    Order Specific Question:   Supervising Provider    Answer:   Arville Care A [[id number]]     *If you need refills on other medications prior to your next appointment, please contact your pharmacy*  Follow-Up: Call back or seek an in-person evaluation if the symptoms worsen or if the condition fails to improve as anticipated.  [facility] [phone]  Other Instructions 1. Take meds as prescribed 2. Use a cool mist humidifier especially during the winter months and when heat has been humid. 3. Use saline nose sprays frequently 4. Saline irrigations of the nose can be very helpful if done frequently.  * 4X daily for 1 week*  * Use of a nettie pot can be helpful with this. Follow directions with this* 5. Drink plenty of fluids 6. Keep thermostat turn down low 7.For any cough or congestion- promethazine DM 8. For fever or aces or pains- take tylenol or ibuprofen appropriate for age and weight.  * for fevers greater than 101 orally you may alternate ibuprofen and tylenol every  3 hours.      If you have been instructed to have an in-person evaluation today at a local Urgent Care facility, please use the link below. It will take you to a list of all of our available [facility], including address, phone number and hours of operation. Please do not delay care.  [facility]  If you or a family member do not have a primary care provider, use the link below to schedule a visit and establish care. When you choose a [facility] primary care physician or advanced practice provider, you gain a long-term partner in health. Find a Primary Care Provider  Learn more about [facility]'s in-office and virtual care options: [facility] - Get Care Now

## 2023-08-29 ENCOUNTER — Ambulatory Visit (HOSPITAL_COMMUNITY): Payer: 59

## 2023-09-28 ENCOUNTER — Other Ambulatory Visit: Payer: Self-pay | Admitting: Family Medicine

## 2023-10-01 ENCOUNTER — Encounter: Payer: Self-pay | Admitting: Pharmacist

## 2023-10-01 NOTE — Progress Notes (Signed)
Pharmacy Quality Measure Review  This patient is appearing on a report for being at risk of failing the adherence measure for diabetes medications this calendar year.   Medication: Tradjenta 5 mg Last fill date: 10/9 for 30 day supply  Insurance report was not up to date. No action needed at this time.   Jarrett Ables, PharmD PGY-1 Pharmacy Resident

## 2023-10-18 ENCOUNTER — Ambulatory Visit: Payer: Medicare Other | Admitting: Family Medicine

## 2023-10-18 ENCOUNTER — Encounter: Payer: Self-pay | Admitting: Family Medicine

## 2023-10-18 VITALS — BP 110/65 | HR 91 | Temp 97.7°F | Ht 63.0 in | Wt 160.0 lb

## 2023-10-18 DIAGNOSIS — I10 Essential (primary) hypertension: Secondary | ICD-10-CM | POA: Diagnosis not present

## 2023-10-18 DIAGNOSIS — R7309 Other abnormal glucose: Secondary | ICD-10-CM

## 2023-10-18 DIAGNOSIS — E785 Hyperlipidemia, unspecified: Secondary | ICD-10-CM | POA: Diagnosis not present

## 2023-10-18 LAB — BAYER DCA HB A1C WAIVED: HB A1C (BAYER DCA - WAIVED): 6.1 % — ABNORMAL HIGH (ref 4.8–5.6)

## 2023-10-18 MED ORDER — ATORVASTATIN CALCIUM 40 MG PO TABS: 40.0000 mg | ORAL_TABLET | Freq: Every day | ORAL | 1 refills | Status: DC

## 2023-10-18 MED ORDER — LINAGLIPTIN 5 MG PO TABS: 5.0000 mg | ORAL_TABLET | Freq: Every day | ORAL | 5 refills | Status: DC

## 2023-10-18 NOTE — Progress Notes (Signed)
Subjective:  Patient ID: Bradley Graham, male    DOB: Nov 14, 1971  Age: 52 y.o. MRN: [id number]  CC: Medical Management of Chronic Issues   HPI Bradley Graham presents forFollow-up of diabetes. Patient checks blood sugar at home.   80s to 129 fasting and 130-150 postprandial Patient denies symptoms such as polyuria, polydipsia, excessive hunger, nausea No significant hypoglycemic spells noted. Medications reviewed. Pt reports taking them regularly without complication/adverse reaction being reported today.   Awaiting medicaid [place] being finalized (had some roadblock they are working on) before pursuing ortho for the scoliosis assessment at [facility].    History Bradley Graham has a past medical history of Atrial fibrillation (HCC), CHF (congestive heart failure) (HCC), CKD (chronic kidney disease), Club foot of both feet, Hypertension, Left ventricular hypertrophy, Right ventricular hypertrophy, Speech impediment, Tetralogy of Fallot, and Vitamin D deficiency.   He has a past surgical history that includes VSD repair; scoliosis; Adenoidectomy; Mouth surgery; Wrist surgery (Left); and Abdominal hernia repair.   His family history includes Cancer in his cousin; Cancer (age of onset: 65) in his maternal grandfather; Diabetes in his father; Diabetes insipidus in his mother; Hyperlipidemia in his father and mother; Hypertension in his father; Hyperthyroidism in his mother and sister; Pernicious anemia in his maternal grandfather.He reports that he has never smoked. He has never used smokeless tobacco. He reports that he does not drink alcohol and does not use drugs.  Current Outpatient Medications on File Prior to Visit  Medication Sig Dispense Refill   Accu-Chek Softclix Lancets lancets CHECK SUGAR IN THE MORNING, AT NOON, AND AT BEDTIME DX E11.9 300 each 3   allopurinol (ZYLOPRIM) 100 MG tablet Take 200 mg by mouth daily.     aspirin 81 MG tablet Take 81 mg by mouth daily.     atorvastatin (LIPITOR) 40  MG tablet TAKE 1 TABLET BY MOUTH EVERY DAY FOR CHOLESTEROL 90 tablet 0   benzonatate (TESSALON) 200 MG capsule Take 1 capsule (200 mg total) by mouth 3 (three) times daily as needed for cough. 20 capsule 3   blood glucose meter kit and supplies KIT Dispense based on patient and insurance preference. Use up to four times daily as directed. (FOR ICD-10 : E11.9 1 each 11   Blood Glucose Monitoring Suppl (ACCU-CHEK GUIDE ME) w/Device KIT CHECK SUGAR IN THE MORNING, AT NOON, AND AT BEDTIME DX E11.9 1 kit 0   budesonide (PULMICORT) 1 MG/2ML nebulizer solution Take 2 mLs (1 mg total) by nebulization daily. 60 mL 12   Colchicine 0.6 MG CAPS Take 1 capsule by mouth every other day.     docusate sodium (COLACE) 100 MG capsule Take 100 mg by mouth daily.     famotidine (PEPCID) 10 MG tablet Take 1 tablet (10 mg total) by mouth 2 (two) times daily. 180 tablet 3   fexofenadine (ALLEGRA) 180 MG tablet Take by mouth.     fluticasone (FLONASE) 50 MCG/ACT nasal spray PLACE 1 SPRAY INTO BOTH NOSTRILS 2 (TWO) TIMES DAILY 48 mL 1   formoterol (PERFOROMIST) 20 MCG/2ML nebulizer solution Take 2 mLs (20 mcg total) by nebulization 2 (two) times daily. 120 mL 1   glucose blood (ACCU-CHEK GUIDE) test strip CHECK SUGAR IN THE MORNING, AT NOON, AND AT BEDTIME DX E11.9 300 strip 3   linagliptin (TRADJENTA) 5 MG TABS tablet Take 1 tablet (5 mg total) by mouth daily. For diabetes 30 tablet 5   loratadine (CLARITIN) 10 MG tablet Take 1 tablet (10 mg total)  by mouth daily. 90 tablet 1   olmesartan (BENICAR) 20 MG tablet TAKE 1 TABLET BY MOUTH EVERY DAY 90 tablet 1   promethazine-dextromethorphan (PROMETHAZINE-DM) 6.25-15 MG/5ML syrup Take 5 mLs by mouth 4 (four) times daily as needed for cough. 118 mL 0   No current facility-administered medications on file prior to visit.    ROS Review of Systems  Constitutional:  Negative for fever.  Respiratory:  Negative for shortness of breath.   Cardiovascular:  Negative for chest  pain.  Musculoskeletal:  Negative for arthralgias.  Skin:  Negative for rash.    Objective:  BP 110/65   Pulse 91   Temp 97.7 F (36.5 C)   Ht 5\' 3"  (1.6 m)   Wt 160 lb (72.6 kg)   SpO2 97%   BMI 28.34 kg/m   BP Readings from Last 3 Encounters:  10/18/23 110/65  08/17/23 120/69  07/10/23 119/65    Wt Readings from Last 3 Encounters:  10/18/23 160 lb (72.6 kg)  08/17/23 161 lb 6.4 oz (73.2 kg)  07/10/23 165 lb 3.2 oz (74.9 kg)     Physical Exam Vitals reviewed.  Constitutional:      Appearance: He is well-developed.  HENT:     Head: Normocephalic and atraumatic.     Right Ear: External ear normal.     Left Ear: External ear normal.     Mouth/Throat:     Pharynx: No oropharyngeal exudate or posterior oropharyngeal erythema.  Eyes:     Pupils: Pupils are equal, round, and reactive to light.  Cardiovascular:     Rate and Rhythm: Normal rate and regular rhythm.     Heart sounds: No murmur heard. Pulmonary:     Effort: No respiratory distress.     Breath sounds: Normal breath sounds.  Musculoskeletal:     Cervical back: Normal range of motion and neck supple.  Neurological:     Mental Status: He is alert and oriented to person, place, and time.       Assessment & Plan:   Bradley Graham was seen today for medical management of chronic issues.  Diagnoses and all orders for this visit:  Elevated hemoglobin A1c -     Bayer DCA Hb A1c Waived  Essential hypertension, benign -     CBC with Differential/Platelet -     CMP14+EGFR  Hyperlipidemia, unspecified hyperlipidemia type -     Lipid panel      I am having Bradley Graham maintain his docusate sodium, aspirin, loratadine, famotidine, blood glucose meter kit and supplies, allopurinol, Colchicine, fexofenadine, formoterol, budesonide, benzonatate, linagliptin, Accu-Chek Softclix Lancets, olmesartan, Accu-Chek Guide Me, Accu-Chek Guide, fluticasone, promethazine-dextromethorphan, and atorvastatin.  No orders of  the defined types were placed in this encounter.    Follow-up: Return in about 3 months (around 01/18/2024).  Mechele Claude, M.D.

## 2023-10-19 LAB — CBC WITH DIFFERENTIAL/PLATELET
Basophils Absolute: 0.1 10*3/uL (ref 0.0–0.2)
Basos: 1 %
EOS (ABSOLUTE): 0.9 10*3/uL — ABNORMAL HIGH (ref 0.0–0.4)
Eos: 12 %
Hematocrit: 37.8 % (ref 37.5–51.0)
Hemoglobin: 12.6 g/dL — ABNORMAL LOW (ref 13.0–17.7)
Immature Grans (Abs): 0 10*3/uL (ref 0.0–0.1)
Immature Granulocytes: 0 %
Lymphocytes Absolute: 1.2 10*3/uL (ref 0.7–3.1)
Lymphs: 17 %
MCH: 30.9 pg (ref 26.6–33.0)
MCHC: 33.3 g/dL (ref 31.5–35.7)
MCV: 93 fL (ref 79–97)
Monocytes Absolute: 0.5 10*3/uL (ref 0.1–0.9)
Monocytes: 7 %
Neutrophils Absolute: 4.5 10*3/uL (ref 1.4–7.0)
Neutrophils: 63 %
Platelets: 123 10*3/uL — ABNORMAL LOW (ref 150–450)
RBC: 4.08 x10E6/uL — ABNORMAL LOW (ref 4.14–5.80)
RDW: 13.1 % (ref 11.6–15.4)
WBC: 7.2 10*3/uL (ref 3.4–10.8)

## 2023-10-19 LAB — CMP14+EGFR
ALT: 14 [IU]/L (ref 0–44)
AST: 19 IU/L (ref 0–40)
Albumin: 4.2 g/dL (ref 3.8–4.9)
Alkaline Phosphatase: 132 IU/L — ABNORMAL HIGH (ref 44–121)
BUN/Creatinine Ratio: 13 (ref 9–20)
BUN: 21 mg/dL (ref 6–24)
Bilirubin Total: 0.4 mg/dL (ref 0.0–1.2)
CO2: 21 mmol/L (ref 20–29)
Calcium: 9.5 mg/dL (ref 8.7–10.2)
Chloride: 104 mmol/L (ref 96–106)
Creatinine, Ser: 1.58 mg/dL — ABNORMAL HIGH (ref 0.76–1.27)
Globulin, Total: 2.3 g/dL (ref 1.5–4.5)
Glucose: 118 mg/dL — ABNORMAL HIGH (ref 70–99)
Potassium: 5.3 mmol/L — ABNORMAL HIGH (ref 3.5–5.2)
Sodium: 141 mmol/L (ref 134–144)
Total Protein: 6.5 g/dL (ref 6.0–8.5)
eGFR: 52 mL/min/{1.73_m2} — ABNORMAL LOW (ref >59)

## 2023-10-19 LAB — LIPID PANEL
Chol/HDL Ratio: 1.9 ratio (ref 0.0–5.0)
Cholesterol, Total: 124 mg/dL (ref 100–199)
HDL: 67 mg/dL (ref >39)
LDL Chol Calc (NIH): 46 mg/dL (ref 0–99)
Triglycerides: 48 mg/dL (ref 0–149)
VLDL Cholesterol Cal: 11 mg/dL (ref 5–40)

## 2023-10-20 NOTE — Progress Notes (Signed)
Hello Kennith,  Your lab result is normal and/or stable.Some minor variations that are not significant are commonly marked abnormal, but do not represent any medical problem for you.  Best regards, [name], M.D.

## 2023-10-30 ENCOUNTER — Encounter: Payer: Self-pay | Admitting: Family Medicine

## 2023-10-30 ENCOUNTER — Ambulatory Visit (INDEPENDENT_AMBULATORY_CARE_PROVIDER_SITE_OTHER): Payer: Medicare Other | Admitting: Family Medicine

## 2023-10-30 VITALS — BP 113/61 | HR 99 | Temp 98.2°F | Ht 63.0 in | Wt 161.5 lb

## 2023-10-30 DIAGNOSIS — J069 Acute upper respiratory infection, unspecified: Secondary | ICD-10-CM | POA: Diagnosis not present

## 2023-10-30 MED ORDER — BENZONATATE 200 MG PO CAPS: 200.0000 mg | ORAL_CAPSULE | Freq: Three times a day (TID) | ORAL | 3 refills | Status: DC | PRN

## 2023-10-30 MED ORDER — DOXYCYCLINE HYCLATE 100 MG PO TABS: 100.0000 mg | ORAL_TABLET | Freq: Two times a day (BID) | ORAL | 0 refills | Status: AC

## 2023-10-30 NOTE — Progress Notes (Signed)
Acute Office Visit  Subjective:     Patient ID: Bradley Graham, male    DOB: 31-Aug-1971, 53 y.o.   MRN: [id number]  Chief Complaint  Patient presents with   Cough    Cough This is a new problem. Episode onset: 7 days. The problem has been unchanged. The problem occurs hourly. The cough is Non-productive. Associated symptoms include headaches, nasal congestion, postnasal drip and a sore throat. Pertinent negatives include no chest pain, chills, ear congestion, ear pain, fever, shortness of breath or wheezing. Treatments tried: flonase, allegra. The treatment provided mild relief.   Here with mother today.   Review of Systems  Constitutional:  Negative for chills and fever.  HENT:  Positive for postnasal drip and sore throat. Negative for ear pain.   Respiratory:  Positive for cough. Negative for shortness of breath and wheezing.   Cardiovascular:  Negative for chest pain.  Neurological:  Positive for headaches.        Objective:    BP 113/61   Pulse 99   Temp 98.2 F (36.8 C) (Temporal)   Ht 5\' 3"  (1.6 m)   Wt 161 lb 8 oz (73.3 kg)   SpO2 93%   BMI 28.61 kg/m    Physical Exam Vitals and nursing note reviewed.  Constitutional:      General: He is not in acute distress.    Appearance: He is not ill-appearing, toxic-appearing or diaphoretic.  HENT:     Right Ear: Tympanic membrane, ear canal and external ear normal.     Left Ear: Tympanic membrane, ear canal and external ear normal.     Nose: Congestion present.     Mouth/Throat:     Mouth: Mucous membranes are moist.     Pharynx: Posterior oropharyngeal erythema present. No pharyngeal swelling or oropharyngeal exudate.     Tonsils: No tonsillar exudate. 1+ on the right. 1+ on the left.  Eyes:     General:        Right eye: No discharge.        Left eye: No discharge.     Conjunctiva/sclera: Conjunctivae normal.  Cardiovascular:     Rate and Rhythm: Normal rate and regular rhythm.     Heart sounds: Murmur  heard.  Pulmonary:     Effort: Pulmonary effort is normal. No respiratory distress.     Breath sounds: Normal breath sounds. No wheezing, rhonchi or rales.  Musculoskeletal:     Cervical back: Neck supple. No rigidity.     Right lower leg: No edema.     Left lower leg: No edema.  Skin:    General: Skin is warm and dry.  Neurological:     Mental Status: He is alert and oriented to person, place, and time. Mental status is at baseline.  Psychiatric:        Mood and Affect: Mood normal.        Behavior: Behavior normal.     No results found for any visits on 10/30/23.      Assessment & Plan:   Bradley Graham was seen today for cough.  Diagnoses and all orders for this visit:  Acute URI No improvement after 7 days of symptomatic care. Will treat with doxycyline due to PCN allergy. Discussed symptomatic care and return precautions.  -     benzonatate (TESSALON) 200 MG capsule; Take 1 capsule (200 mg total) by mouth 3 (three) times daily as needed for cough. -     doxycycline (VIBRA-TABS) 100  MG tablet; Take 1 tablet (100 mg total) by mouth 2 (two) times daily for 7 days.  The patient indicates understanding of these issues and agrees with the plan.  Gabriel Earing, FNP

## 2023-11-06 ENCOUNTER — Encounter: Payer: Self-pay | Admitting: Family Medicine

## 2023-11-06 ENCOUNTER — Ambulatory Visit (INDEPENDENT_AMBULATORY_CARE_PROVIDER_SITE_OTHER): Payer: Medicare Other | Admitting: Family Medicine

## 2023-11-06 VITALS — BP 123/69 | HR 92 | Temp 98.0°F | Ht 63.0 in | Wt 164.4 lb

## 2023-11-06 DIAGNOSIS — J029 Acute pharyngitis, unspecified: Secondary | ICD-10-CM

## 2023-11-06 MED ORDER — LEVOFLOXACIN 500 MG PO TABS: 500.0000 mg | ORAL_TABLET | Freq: Every day | ORAL | 0 refills | Status: DC

## 2023-11-06 MED ORDER — BETAMETHASONE SOD PHOS & ACET 6 (3-3) MG/ML IJ SUSP: 6.0000 mg | Freq: Once | INTRAMUSCULAR | Status: AC

## 2023-11-06 NOTE — Progress Notes (Signed)
Chief Complaint  Patient presents with   Sore Throat    HPI  Patient presents today for SORE THROAT AND COUGH THAT DIDN'Trespond to the doxycycline. Hurts to swallow, Ongoing 2 weeks. Cough better, but ST worse. No fever. No effect on glucose.  PMH: Smoking status noted ROS: Per HPI  Objective: BP 123/69   Pulse 92   Temp 98 F (36.7 C)   Ht 5\' 3"  (1.6 m)   Wt 164 lb 6.4 oz (74.6 kg)   SpO2 94%   BMI 29.12 kg/m  Gen: NAD, alert, cooperative with exam HEENT: NCAT, EOMI, PERRL.Pharynx mildly erythematous. CV: RRR, good S1/S2, no murmur Resp: CTABL, no wheezes, non-labored Ext: No edema, warm Neuro: Alert and oriented, No gross deficits  Assessment and plan:  1. Pharyngitis, unspecified etiology     Meds ordered this encounter  Medications   levofloxacin (LEVAQUIN) 500 MG tablet    Sig: Take 1 tablet (500 mg total) by mouth daily.    Dispense:  7 tablet    Refill:  0   betamethasone acetate-betamethasone sodium phosphate (CELESTONE) injection 6 mg      Follow up as needed.  Mechele Claude, MD

## 2024-01-24 ENCOUNTER — Other Ambulatory Visit: Payer: Self-pay | Admitting: Family Medicine

## 2024-01-24 DIAGNOSIS — N189 Chronic kidney disease, unspecified: Secondary | ICD-10-CM

## 2024-01-29 ENCOUNTER — Encounter: Payer: Self-pay | Admitting: Family Medicine

## 2024-01-29 ENCOUNTER — Ambulatory Visit (INDEPENDENT_AMBULATORY_CARE_PROVIDER_SITE_OTHER): Payer: 59 | Admitting: Family Medicine

## 2024-01-29 VITALS — BP 128/90 | HR 90 | Temp 97.2°F | Ht 63.0 in | Wt 162.0 lb

## 2024-01-29 DIAGNOSIS — I10 Essential (primary) hypertension: Secondary | ICD-10-CM | POA: Diagnosis not present

## 2024-01-29 DIAGNOSIS — E785 Hyperlipidemia, unspecified: Secondary | ICD-10-CM

## 2024-01-29 DIAGNOSIS — G801 Spastic diplegic cerebral palsy: Secondary | ICD-10-CM

## 2024-01-29 DIAGNOSIS — N1832 Chronic kidney disease, stage 3b: Secondary | ICD-10-CM

## 2024-01-29 DIAGNOSIS — R7309 Other abnormal glucose: Secondary | ICD-10-CM | POA: Diagnosis not present

## 2024-01-29 LAB — BAYER DCA HB A1C WAIVED: HB A1C (BAYER DCA - WAIVED): 6.1 % — ABNORMAL HIGH (ref 4.8–5.6)

## 2024-01-29 LAB — LIPID PANEL

## 2024-01-29 NOTE — Progress Notes (Signed)
 Subjective:  Patient ID: Bradley Graham,  male    DOB: [birth year]  Age: 53 y.o.    CC: Medical Management of Chronic Issues (No concerns at this time. )   HPI Carlo L Montee presents for  follow-up of hypertension. Patient has no history of headache chest pain or shortness of breath or recent cough. Patient also denies symptoms of TIA such as numbness weakness lateralizing. Patient denies side effects from medication. States taking it regularly.  Patient also  in for follow-up of elevated cholesterol. Doing well without complaints on current medication. Denies side effects  including myalgia and arthralgia and nausea. Also in today for liver function testing. Currently no chest pain, shortness of breath or other cardiovascular related symptoms noted.  Follow-up of diabetes. Patient does check blood sugar at home. Readings run between 100 and 140 Patient denies symptoms such as excessive hunger or urinary frequency, excessive hunger, nausea No significant hypoglycemic spells noted. Medications reviewed. Pt reports taking them regularly. Pt. denies complication/adverse reaction today.    History Xian has a past medical history of Atrial fibrillation (HCC), CHF (congestive heart failure) (HCC), CKD (chronic kidney disease), Club foot of both feet, Hypertension, Left ventricular hypertrophy, Right ventricular hypertrophy, Speech impediment, Tetralogy of Fallot, and Vitamin D deficiency.   He has a past surgical history that includes VSD repair; scoliosis; Adenoidectomy; Mouth surgery; Wrist surgery (Left); and Abdominal hernia repair.   His family history includes Cancer in his cousin; Cancer (age of onset: 17) in his maternal grandfather; Diabetes in his father; Diabetes insipidus in his mother; Hyperlipidemia in his father and mother; Hypertension in his father; Hyperthyroidism in his mother and sister; Pernicious anemia in his maternal grandfather.He reports that he has never smoked. He has  never used smokeless tobacco. He reports that he does not drink alcohol and does not use drugs.  Current Outpatient Medications on File Prior to Visit  Medication Sig Dispense Refill   Accu-Chek Softclix Lancets lancets CHECK SUGAR IN THE MORNING, AT NOON, AND AT BEDTIME DX E11.9 300 each 3   allopurinol (ZYLOPRIM) 100 MG tablet Take 200 mg by mouth daily.     aspirin 81 MG tablet Take 81 mg by mouth daily.     atorvastatin (LIPITOR) 40 MG tablet Take 1 tablet (40 mg total) by mouth daily. 90 tablet 1   benzonatate (TESSALON) 200 MG capsule Take 1 capsule (200 mg total) by mouth 3 (three) times daily as needed for cough. 20 capsule 3   blood glucose meter kit and supplies KIT Dispense based on patient and insurance preference. Use up to four times daily as directed. (FOR ICD-10 : E11.9 1 each 11   Blood Glucose Monitoring Suppl (ACCU-CHEK GUIDE ME) w/Device KIT CHECK SUGAR IN THE MORNING, AT NOON, AND AT BEDTIME DX E11.9 1 kit 0   budesonide (PULMICORT) 1 MG/2ML nebulizer solution Take 2 mLs (1 mg total) by nebulization daily. 60 mL 12   Colchicine 0.6 MG CAPS Take 1 capsule by mouth every other day.     docusate sodium (COLACE) 100 MG capsule Take 100 mg by mouth daily.     famotidine (PEPCID) 10 MG tablet Take 1 tablet (10 mg total) by mouth 2 (two) times daily. 180 tablet 3   fexofenadine (ALLEGRA) 180 MG tablet Take by mouth.     fluticasone (FLONASE) 50 MCG/ACT nasal spray PLACE 1 SPRAY INTO BOTH NOSTRILS 2 (TWO) TIMES DAILY 48 mL 1   formoterol (PERFOROMIST) 20 MCG/2ML nebulizer solution  Take 2 mLs (20 mcg total) by nebulization 2 (two) times daily. 120 mL 1   glucose blood (ACCU-CHEK GUIDE) test strip CHECK SUGAR IN THE MORNING, AT NOON, AND AT BEDTIME DX E11.9 300 strip 3   levofloxacin (LEVAQUIN) 500 MG tablet Take 1 tablet (500 mg total) by mouth daily. 7 tablet 0   linagliptin (TRADJENTA) 5 MG TABS tablet Take 1 tablet (5 mg total) by mouth daily. For diabetes 30 tablet 5   loratadine  (CLARITIN) 10 MG tablet Take 1 tablet (10 mg total) by mouth daily. 90 tablet 1   olmesartan (BENICAR) 20 MG tablet TAKE 1 TABLET BY MOUTH EVERY DAY 90 tablet 0   Current Facility-Administered Medications on File Prior to Visit  Medication Dose Route Frequency Provider Last Rate Last Admin   betamethasone acetate-betamethasone sodium phosphate (CELESTONE) injection 6 mg  6 mg Intramuscular Once         ROS Review of Systems  Constitutional: Negative.   HENT: Negative.    Eyes:  Negative for visual disturbance.  Respiratory:  Negative for cough and shortness of breath.   Cardiovascular:  Negative for chest pain and leg swelling.  Gastrointestinal:  Negative for abdominal pain, diarrhea, nausea and vomiting.  Genitourinary:  Negative for difficulty urinating.  Musculoskeletal:  Negative for arthralgias and myalgias.  Skin:  Negative for rash.  Neurological:  Negative for headaches.  Psychiatric/Behavioral:  Negative for sleep disturbance.     Objective:  BP (!) 128/90   Pulse 90   Temp (!) 97.2 F (36.2 C)   Ht 5\' 3"  (1.6 m)   Wt 162 lb (73.5 kg)   SpO2 96%   BMI 28.70 kg/m   BP Readings from Last 3 Encounters:  01/29/24 (!) 128/90  11/06/23 123/69  10/30/23 113/61    Wt Readings from Last 3 Encounters:  01/29/24 162 lb (73.5 kg)  11/06/23 164 lb 6.4 oz (74.6 kg)  10/30/23 161 lb 8 oz (73.3 kg)     Physical Exam Vitals reviewed.  Constitutional:      Appearance: He is well-developed.  HENT:     Head: Normocephalic and atraumatic.     Right Ear: External ear normal.     Left Ear: External ear normal.     Mouth/Throat:     Pharynx: No oropharyngeal exudate or posterior oropharyngeal erythema.  Eyes:     Pupils: Pupils are equal, round, and reactive to light.  Cardiovascular:     Rate and Rhythm: Normal rate and regular rhythm.     Heart sounds: No murmur heard. Pulmonary:     Effort: No respiratory distress.     Breath sounds: Normal breath sounds.   Musculoskeletal:     Cervical back: Normal range of motion and neck supple.  Neurological:     Mental Status: He is alert and oriented to person, place, and time.     Diabetic Foot Exam - Simple   No data filed     Lab Results  Component Value Date   HGBA1C 6.1 (H) 01/29/2024   HGBA1C 6.1 (H) 10/18/2023   HGBA1C 6.6 (H) 07/10/2023    Assessment & Plan:   [place] was seen today for medical management of chronic issues.  Diagnoses and all orders for this visit:  Essential hypertension, benign -     CBC with Differential/Platelet  Elevated hemoglobin A1c -     Bayer DCA Hb A1c Waived  Hyperlipidemia, unspecified hyperlipidemia type -     Lipid panel  Spastic  cerebral palsy, congenital (HCC) -     Bayer DCA Hb A1c Waived -     CBC with Differential/Platelet -     CMP14+EGFR -     Lipid panel  Stage 3b chronic kidney disease (HCC) -     CMP14+EGFR   I am having Ronn L. Goldner maintain his docusate sodium, aspirin, loratadine, famotidine, blood glucose meter kit and supplies, allopurinol, Colchicine, fexofenadine, formoterol, budesonide, Accu-Chek Softclix Lancets, Accu-Chek Guide Me, Accu-Chek Guide, fluticasone, atorvastatin, linagliptin, benzonatate, levofloxacin, and olmesartan. We will continue to administer betamethasone acetate-betamethasone sodium phosphate.  No orders of the defined types were placed in this encounter.    Follow-up: No follow-ups on file.  Mechele Claude, M.D.

## 2024-01-30 LAB — CBC WITH DIFFERENTIAL/PLATELET
Basophils Absolute: 0.1 10*3/uL (ref 0.0–0.2)
Basos: 1 %
EOS (ABSOLUTE): 0.8 10*3/uL — ABNORMAL HIGH (ref 0.0–0.4)
Eos: 14 %
Hematocrit: 41.7 % (ref 37.5–51.0)
Hemoglobin: 13.6 g/dL (ref 13.0–17.7)
Immature Grans (Abs): 0 10*3/uL (ref 0.0–0.1)
Immature Granulocytes: 0 %
Lymphocytes Absolute: 1.1 10*3/uL (ref 0.7–3.1)
Lymphs: 19 %
MCH: 31.1 pg (ref 26.6–33.0)
MCHC: 32.6 g/dL (ref 31.5–35.7)
MCV: 95 fL (ref 79–97)
Monocytes Absolute: 0.5 10*3/uL (ref 0.1–0.9)
Monocytes: 8 %
Neutrophils Absolute: 3.4 10*3/uL (ref 1.4–7.0)
Neutrophils: 58 %
Platelets: 116 10*3/uL — ABNORMAL LOW (ref 150–450)
RBC: 4.37 x10E6/uL (ref 4.14–5.80)
RDW: 13.3 % (ref 11.6–15.4)
WBC: 5.9 10*3/uL (ref 3.4–10.8)

## 2024-01-30 LAB — CMP14+EGFR
ALT: 13 IU/L (ref 0–44)
AST: 28 IU/L (ref 0–40)
Albumin: 4.7 g/dL (ref 3.8–4.9)
Alkaline Phosphatase: 127 IU/L — ABNORMAL HIGH (ref 44–121)
BUN/Creatinine Ratio: 16 (ref 9–20)
BUN: 35 mg/dL — ABNORMAL HIGH (ref 6–24)
Bilirubin Total: 0.7 mg/dL (ref 0.0–1.2)
CO2: 19 mmol/L — ABNORMAL LOW (ref 20–29)
Calcium: 9.8 mg/dL (ref 8.7–10.2)
Chloride: 104 mmol/L (ref 96–106)
Creatinine, Ser: 2.22 mg/dL — ABNORMAL HIGH (ref 0.76–1.27)
Globulin, Total: 2.3 g/dL (ref 1.5–4.5)
Glucose: 94 mg/dL (ref 70–99)
Potassium: 5 mmol/L (ref 3.5–5.2)
Sodium: 143 mmol/L (ref 134–144)
Total Protein: 7 g/dL (ref 6.0–8.5)
eGFR: 35 mL/min/{1.73_m2} — ABNORMAL LOW (ref >59)

## 2024-01-30 LAB — LIPID PANEL
Cholesterol, Total: 145 mg/dL (ref 100–199)
HDL: 79 mg/dL (ref >39)
LDL CALC COMMENT:: 1.8 ratio (ref 0.0–5.0)
LDL Chol Calc (NIH): 52 mg/dL (ref 0–99)
Triglycerides: 68 mg/dL (ref 0–149)
VLDL Cholesterol Cal: 14 mg/dL (ref 5–40)

## 2024-01-31 ENCOUNTER — Other Ambulatory Visit: Payer: Self-pay | Admitting: Family Medicine

## 2024-02-01 ENCOUNTER — Telehealth: Payer: Self-pay | Admitting: Family Medicine

## 2024-02-01 NOTE — Telephone Encounter (Signed)
 Faxed  Copied from CRM [id number]. Topic: Clinical - Lab/Test Results >> Feb 01, 2024  8:32 AM Louie Casa B wrote: Reason for CRM: patients need lab uric acid faxed to rheumatology [facility] dr Marcelino Duster young on [address] please call patients mother [phone]

## 2024-02-02 LAB — URIC ACID: Uric Acid: 3.7 mg/dL — ABNORMAL LOW (ref 3.8–8.4)

## 2024-02-02 LAB — SPECIMEN STATUS REPORT

## 2024-02-05 ENCOUNTER — Telehealth: Payer: Self-pay | Admitting: Family Medicine

## 2024-02-05 NOTE — Telephone Encounter (Signed)
 Faxed.ls

## 2024-02-05 NOTE — Telephone Encounter (Signed)
 Copied from CRM [id number]. Topic: Clinical - Lab/Test Results >> Feb 05, 2024 10:52 AM Maxwell Marion wrote: Reason for CRM: Patient's mom is requesting uric acid results be faxed to patient's rheumatology doctor, PA-C Azucena Fallen at [facility] [address] in [place]. Fax number is [phone]

## 2024-02-11 ENCOUNTER — Other Ambulatory Visit: Payer: Self-pay | Admitting: Family Medicine

## 2024-03-06 ENCOUNTER — Ambulatory Visit (INDEPENDENT_AMBULATORY_CARE_PROVIDER_SITE_OTHER): Payer: 59

## 2024-03-06 VITALS — BP 128/90 | HR 90 | Ht 63.0 in | Wt 162.0 lb

## 2024-03-06 DIAGNOSIS — Z0000 Encounter for general adult medical examination without abnormal findings: Secondary | ICD-10-CM | POA: Diagnosis not present

## 2024-03-06 NOTE — Patient Instructions (Signed)
 Bradley Graham , Thank you for taking time to come for your Medicare Wellness Visit. I appreciate your ongoing commitment to your health goals. Please review the following plan we discussed and let me know if I can assist you in the future.     This is a list of the screening recommended for you and due dates:  Health Maintenance  Topic Date Due   COVID-19 Vaccine (3 - 2024-25 season) 08/06/2023   Zoster (Shingles) Vaccine (1 of 2) 04/27/2024*   Pneumococcal Vaccination (1 of 2 - PCV) 01/28/2025*   Colon Cancer Screening  01/28/2025*   Flu Shot  07/05/2024   Medicare Annual Wellness Visit  03/06/2025   DTaP/Tdap/Td vaccine (2 - Td or Tdap) 02/06/2029   Hepatitis C Screening  Completed   HIV Screening  Completed   HPV Vaccine  Aged Out  *Topic was postponed. The date shown is not the original due date.    Advanced directives: (Copy Requested) Please bring a copy of your health care power of attorney and living will to the office to be added to your chart at your convenience. You can mail to [facility] [address] [place], [place] [address] or email to [email or web address]  Next Medicare Annual Wellness Visit scheduled for next year: Yes

## 2024-03-06 NOTE — Progress Notes (Signed)
 Subjective:   Bradley Graham is a 53 y.o. who presents for a Medicare Wellness preventive visit.  Visit Complete: Virtual I connected with  Bradley Graham on 03/06/24 by a audio enabled telemedicine application and verified that I am speaking with the correct person using two identifiers.  Patient Location: Home  Provider Location: Home Office  I discussed the limitations of evaluation and management by telemedicine. The patient expressed understanding and agreed to proceed.  Vital Signs: Because this visit was a virtual/telehealth visit, some criteria may be missing or patient reported. Any vitals not documented were not able to be obtained and vitals that have been documented are patient reported.  VideoDeclined- This patient declined Librarian, academic. Therefore the visit was completed with audio only.  Persons Participating in Visit: Patient.  AWV Questionnaire: No: Patient Medicare AWV questionnaire was not completed prior to this visit.  Cardiac Risk Factors include: advanced age (>80men, >68 women);hypertension;male gender;dyslipidemia     Objective:    Today's Vitals   03/06/24 1451  BP: (!) 128/90  Pulse: 90  Weight: 162 lb (73.5 kg)  Height: 5\' 3"  (1.6 m)   Body mass index is 28.7 kg/m.     03/06/2024    2:35 PM 03/06/2023    2:26 PM 02/28/2022    3:39 PM 02/22/2021   10:09 AM 02/10/2020    8:34 AM 02/07/2019   10:11 AM 06/27/2016   11:10 AM  Advanced Directives  Does Patient Have a Medical Advance Directive? Yes No Yes No Yes No;Yes No  Type of Immunologist Power of [employer] Power of Attorney   Copy of Healthcare Power of Attorney in Chart? No - copy requested  No - copy requested   No - copy requested   Would patient like information on creating a medical advance directive?  No - Patient declined  No - Guardian declined  Yes (MAU/Ambulatory/Procedural  Areas - Information given)     Current Medications (verified) Outpatient Encounter Medications as of 03/06/2024  Medication Sig   Accu-Chek Softclix Lancets lancets CHECK SUGAR IN THE MORNING, AT NOON, AND AT BEDTIME DX E11.9   allopurinol (ZYLOPRIM) 100 MG tablet Take 200 mg by mouth daily.   aspirin 81 MG tablet Take 81 mg by mouth daily.   atorvastatin (LIPITOR) 40 MG tablet Take 1 tablet (40 mg total) by mouth daily.   benzonatate (TESSALON) 200 MG capsule Take 1 capsule (200 mg total) by mouth 3 (three) times daily as needed for cough.   blood glucose meter kit and supplies KIT Dispense based on patient and insurance preference. Use up to four times daily as directed. (FOR ICD-10 : E11.9   Blood Glucose Monitoring Suppl (ACCU-CHEK GUIDE ME) w/Device KIT CHECK SUGAR IN THE MORNING, AT NOON, AND AT BEDTIME DX E11.9   budesonide (PULMICORT) 1 MG/2ML nebulizer solution Take 2 mLs (1 mg total) by nebulization daily.   Colchicine 0.6 MG CAPS Take 1 capsule by mouth every other day.   docusate sodium (COLACE) 100 MG capsule Take 100 mg by mouth daily.   famotidine (PEPCID) 10 MG tablet Take 1 tablet (10 mg total) by mouth 2 (two) times daily.   fexofenadine (ALLEGRA) 180 MG tablet Take by mouth.   fluticasone (FLONASE) 50 MCG/ACT nasal spray PLACE 1 SPRAY INTO BOTH NOSTRILS 2 (TWO) TIMES DAILY   formoterol (PERFOROMIST) 20 MCG/2ML nebulizer solution Take 2 mLs (20 mcg  total) by nebulization 2 (two) times daily.   glucose blood (ACCU-CHEK GUIDE) test strip CHECK SUGAR IN THE MORNING, AT NOON, AND AT BEDTIME DX E11.9   linagliptin (TRADJENTA) 5 MG TABS tablet Take 1 tablet (5 mg total) by mouth daily. For diabetes   loratadine (CLARITIN) 10 MG tablet Take 1 tablet (10 mg total) by mouth daily.   olmesartan (BENICAR) 20 MG tablet TAKE 1 TABLET BY MOUTH EVERY DAY   Facility-Administered Encounter Medications as of 03/06/2024  Medication   betamethasone acetate-betamethasone sodium phosphate  (CELESTONE) injection 6 mg    Allergies (verified) Aleve [naproxen sodium], Bactrim [sulfamethoxazole-trimethoprim], Erythromycin, Heparin, Nsaids, Penicillins, and Cortisporin [bacitra-neomycin-polymyxin-hc]   History: Past Medical History:  Diagnosis Date   Atrial fibrillation (HCC)    CHF (congestive heart failure) (HCC)    CKD (chronic kidney disease)    Club foot of both feet    Hypertension    Left ventricular hypertrophy    Right ventricular hypertrophy    Speech impediment    Tetralogy of Fallot    s/p waterson shunt   Vitamin D deficiency    Past Surgical History:  Procedure Laterality Date   ABDOMINAL HERNIA REPAIR     [facility]    ADENOIDECTOMY     MOUTH SURGERY     scoliosis     VSD REPAIR     WRIST SURGERY Left    Family History  Problem Relation Age of Onset   Hyperlipidemia Mother    Hyperthyroidism Mother    Diabetes insipidus Mother    Diabetes Father    Hyperlipidemia Father    Hypertension Father    Hyperthyroidism Sister    Cancer Maternal Grandfather 3       breast cancer   Pernicious anemia Maternal Grandfather    Cancer Cousin        colon cance   Social History   Socioeconomic History   Marital status: Single    Spouse name: Not on file   Number of children: Not on file   Years of education: Not on file   Highest education level: 12th grade  Occupational History   Not on file  Tobacco Use   Smoking status: Never   Smokeless tobacco: Never  Vaping Use   Vaping status: Never Used  Substance and Sexual Activity   Alcohol use: No   Drug use: No   Sexual activity: Never  Other Topics Concern   Not on file  Social History Narrative   Single, lives with parents.   Social Drivers of Corporate investment banker Strain: Low Risk  (03/06/2024)   Overall Financial Resource Strain (CARDIA)    Difficulty of Paying Living Expenses: Not hard at all  Food Insecurity: No Food Insecurity (03/06/2024)   Hunger Vital Sign    Worried About  Running Out of Food in the Last Year: Never true    Ran Out of Food in the Last Year: Never true  Transportation Needs: No Transportation Needs (03/06/2024)   PRAPARE - Administrator, Civil Service (Medical): No    Lack of Transportation (Non-Medical): No  Physical Activity: Sufficiently Active (03/06/2024)   Exercise Vital Sign    Days of Exercise per Week: 4 days    Minutes of Exercise per Session: 50 min  Stress: No Stress Concern Present (03/06/2024)   [employer] of Occupational Health - Occupational Stress Questionnaire    Feeling of Stress : Not at all  Social Connections: Moderately Isolated (03/06/2024)  Social Advertising account executive [NHANES]    Frequency of Communication with Friends and Family: More than three times a week    Frequency of Social Gatherings with Friends and Family: More than three times a week    Attends Religious Services: More than 4 times per year    Active Member of [employer] or Organizations: No    Attends Engineer, structural: Never    Marital Status: Never married    Tobacco Counseling Counseling given: Yes    Clinical Intake:  Pre-visit preparation completed: Yes  Pain : No/denies pain     Nutritional Risks: None Diabetes: No  Lab Results  Component Value Date   HGBA1C 6.1 (H) 01/29/2024   HGBA1C 6.1 (H) 10/18/2023   HGBA1C 6.6 (H) 07/10/2023     How often do you need to have someone help you when you read instructions, pamphlets, or other written materials from your doctor or pharmacy?: 5 - Always (mom/helsp w/reading)  Interpreter Needed?: No  Information entered by :: Alia t/Cma   Activities of Daily Living     03/06/2024    2:28 PM  In your present state of health, do you have any difficulty performing the following activities:  Hearing? 0  Vision? 0  Comment glasses  Difficulty concentrating or making decisions? 1  Comment sometimes  Walking or climbing stairs? 0  Dressing or bathing? 0   Doing errands, shopping? 1  Comment mom drives Wellsite geologist and eating ? Y  Comment mom helps w/cooking  Using the Toilet? N  In the past six months, have you accidently leaked urine? N  Do you have problems with loss of bowel control? N  Managing your Medications? Y  Comment mom helps w/med  Managing your Finances? Y  Comment mom helps w/med  Housekeeping or managing your Housekeeping? Y  Comment mom helps w/med    Patient Care Team: Mechele Claude, MD as PCP - General (Family Medicine) Tarri Fuller, MD (Otolaryngology) Hulen Skains, MD (Pulmonary Disease) Revonda Humphrey, MD (Internal Medicine) Rocco, Mora Appl, MD as Referring Physician (Nephrology) Isaiah Blakes, LCSW as Social Worker (Licensed Clinical Social Worker) Azucena Fallen, PA-C as Consulting Physician (Rheumatology)  Indicate any recent Medical Services you may have received from other than [facility] providers in the past year (date may be approximate).     Assessment:   This is a routine wellness examination for Bradley Graham.  Hearing/Vision screen Hearing Screening - Comments:: Pt denies hearing def Vision Screening - Comments:: Pt denies vision deg   Goals Addressed             This Visit's Progress    Exercise 150 minutes per week (moderate activity)   On track    Continue to exercise for at least 30 minutes daily     Patient Stated       4/ 2/25 Try work on memorizing the months of the year       Depression Screen     03/06/2024    2:38 PM 01/29/2024   10:28 AM 11/06/2023    3:29 PM 10/30/2023   10:24 AM 10/18/2023   10:27 AM 10/18/2023   10:09 AM 08/17/2023    2:42 PM  PHQ 2/9 Scores  PHQ - 2 Score 1 0 0 1 2 0 0  PHQ- 9 Score 5 0  1 2      Fall Risk     03/06/2024    2:37 PM 11/06/2023  3:28 PM 10/30/2023   10:25 AM 10/18/2023   10:08 AM 08/17/2023    2:42 PM  Fall Risk   Falls in the past year? 0 0 0 0 0  Number falls in past yr: 0      Injury with Fall? 0       Risk for fall due to : No Fall Risks      Follow up Falls prevention discussed;Falls evaluation completed        MEDICARE RISK AT HOME:  Medicare Risk at Home Any stairs in or around the home?: Yes If so, are there any without handrails?: Yes Home free of loose throw rugs in walkways, pet beds, electrical cords, etc?: Yes Adequate lighting in your home to reduce risk of falls?: Yes Life alert?: No Use of a cane, walker or w/c?: No Grab bars in the bathroom?: No Shower chair or bench in shower?: Yes Elevated toilet seat or a handicapped toilet?: No  TIMED UP AND GO:  Was the test performed?  N0  Cognitive Function: 6CIT completed    02/07/2019   10:22 AM 07/19/2017    9:33 AM 05/06/2015   11:57 AM  MMSE - Mini Mental State Exam  Not completed:   Unable to complete  Orientation to time 4 5   Orientation to Place 4 5   Registration 3 3   Attention/ Calculation 5 4   Recall 3 3   Language- name 2 objects 2 2   Language- repeat 1 1   Language- follow 3 step command 3 3   Language- read & follow direction 1 1   Write a sentence 1 1   Copy design 1 0   Total score 28 28         03/06/2024    2:45 PM 02/22/2021   10:01 AM 02/10/2020    8:38 AM  6CIT Screen  What Year? 0 points 0 points 0 points  What month? 0 points 0 points 0 points  What time? 0 points 0 points 0 points  Count back from 20 0 points 0 points 0 points  Months in reverse 4 points 4 points 4 points  Repeat phrase 2 points 0 points 2 points  Total Score 6 points 4 points 6 points    Immunizations Immunization History  Administered Date(s) Administered   Influenza Inj Mdck Quad Pf 09/19/2019   Influenza, High Dose Seasonal PF 11/12/2018   Influenza, Seasonal, Injecte, Preservative Fre 08/17/2023   Influenza,inj,Quad PF,6+ Mos 09/29/2016, 08/10/2017   Influenza-Unspecified 10/20/2014, 10/08/2015, 11/12/2018, 10/05/2020   Janssen (J&J) SARS-COV-2 Vaccination 08/07/2020   Moderna Sars-Covid-2 Vaccination  10/21/2020   Tdap 02/07/2019    Screening Tests Health Maintenance  Topic Date Due   COVID-19 Vaccine (3 - 2024-25 season) 08/06/2023   Zoster Vaccines- Shingrix (1 of 2) 04/27/2024 (Originally [birth year]/13/1990)   Pneumococcal Vaccine 51-47 Years old (1 of 2 - PCV) 01/28/2025 (Originally 04/13/1977)   Colonoscopy  01/28/2025 (Originally 04/13/2016)   INFLUENZA VACCINE  07/05/2024   Medicare Annual Wellness (AWV)  03/06/2025   DTaP/Tdap/Td (2 - Td or Tdap) 02/06/2029   Hepatitis C Screening  Completed   HIV Screening  Completed   HPV VACCINES  Aged Out    Health Maintenance  Health Maintenance Due  Topic Date Due   COVID-19 Vaccine (3 - 2024-25 season) 08/06/2023   Health Maintenance Items Addressed: See Nurse Notes  Additional Screening:  Vision Screening: Recommended annual ophthalmology exams for early detection of glaucoma and other  disorders of the eye.  Dental Screening: Recommended annual dental exams for proper oral hygiene  Community Resource Referral / Chronic Care Management: CRR required this visit?  No   CCM required this visit?  No     Plan:     I have personally reviewed and noted the following in the patient's chart:   Medical and social history Use of alcohol, tobacco or illicit drugs  Current medications and supplements including opioid prescriptions. Patient is not currently taking opioid prescriptions. Functional ability and status Nutritional status Physical activity Advanced directives List of other physicians Hospitalizations, surgeries, and ER visits in previous 12 months Vitals Screenings to include cognitive, depression, and falls Referrals and appointments  In addition, I have reviewed and discussed with patient certain preventive protocols, quality metrics, and best practice recommendations. A written personalized care plan for preventive services as well as general preventive health recommendations were provided to patient.     Arta Silence, CMA   03/06/2024   After Visit Summary: (MyChart) Due to this being a telephonic visit, the after visit summary with patients personalized plan was offered to patient via MyChart   Notes: Nothing significant to report at this time.

## 2024-03-18 ENCOUNTER — Telehealth: Payer: Self-pay | Admitting: Family Medicine

## 2024-03-18 ENCOUNTER — Other Ambulatory Visit: Payer: Self-pay | Admitting: Family Medicine

## 2024-03-18 DIAGNOSIS — M4185 Other forms of scoliosis, thoracolumbar region: Secondary | ICD-10-CM

## 2024-03-18 NOTE — Telephone Encounter (Signed)
MRIs ordered.

## 2024-03-18 NOTE — Telephone Encounter (Signed)
 Please advise. I see that the pt was scheduled for MRI on 4/19 but it was cancelled by Cristine Done with reason (order closed need new order pt to call MD cd).    Copied from CRM [id number]. Topic: General - Other >> Mar 18, 2024  2:56 PM Turkey B wrote: Reason for CRM: pt's Mother called in for order for Mri of back. He didn't get to do the previous one that was ordered

## 2024-03-19 NOTE — Telephone Encounter (Signed)
 Mother notified that new order has been placed. LS

## 2024-03-23 ENCOUNTER — Other Ambulatory Visit (HOSPITAL_COMMUNITY): Payer: Self-pay

## 2024-03-28 ENCOUNTER — Telehealth: Payer: Self-pay | Admitting: Family Medicine

## 2024-03-28 DIAGNOSIS — Z0279 Encounter for issue of other medical certificate: Secondary | ICD-10-CM

## 2024-03-28 NOTE — Telephone Encounter (Signed)
 PT Mom dropped off Handicap forms to be completed and signed.  Form Fee Paid? (Yes)            If NO, form is placed on front office manager desk to hold until payment received. If YES, then form will be placed in the RX/HH Nurse Coordinators box for completion.  Form will not be processed until payment is received

## 2024-03-29 ENCOUNTER — Ambulatory Visit (HOSPITAL_COMMUNITY)
Admission: RE | Admit: 2024-03-29 | Discharge: 2024-03-29 | Disposition: A | Discharge status: Home / Self Care | Source: Ambulatory Visit | Attending: Family Medicine | Admitting: Family Medicine

## 2024-03-29 DIAGNOSIS — M4185 Other forms of scoliosis, thoracolumbar region: Secondary | ICD-10-CM

## 2024-04-01 NOTE — Telephone Encounter (Unsigned)
 Copied from CRM [id number]. Topic: Referral - Status >> Apr 01, 2024  3:28 PM Baldomero Bone wrote: Reason for CRM: Patient's mother, Bradley Graham, is calling about the referral to [facility]. She stated she has an appointment but is not sure that is the provider that Dr Veleta Gerold wanted the patient to see. Callback number is [phone]

## 2024-04-01 NOTE — Telephone Encounter (Signed)
 Is Dr Veleta Gerold familiar with this referral?

## 2024-04-03 NOTE — Telephone Encounter (Signed)
 Aware DMV form ready

## 2024-04-04 ENCOUNTER — Encounter: Payer: Self-pay | Admitting: Family Medicine

## 2024-04-24 ENCOUNTER — Other Ambulatory Visit: Payer: Self-pay | Admitting: Family Medicine

## 2024-04-24 DIAGNOSIS — N189 Chronic kidney disease, unspecified: Secondary | ICD-10-CM

## 2024-05-01 ENCOUNTER — Ambulatory Visit: Payer: 59 | Admitting: Family Medicine

## 2024-05-09 ENCOUNTER — Ambulatory Visit (INDEPENDENT_AMBULATORY_CARE_PROVIDER_SITE_OTHER)

## 2024-05-09 ENCOUNTER — Ambulatory Visit (INDEPENDENT_AMBULATORY_CARE_PROVIDER_SITE_OTHER): Admitting: Family Medicine

## 2024-05-09 VITALS — BP 124/74 | HR 93 | Temp 98.0°F | Ht 63.0 in | Wt 164.0 lb

## 2024-05-09 DIAGNOSIS — N1832 Chronic kidney disease, stage 3b: Secondary | ICD-10-CM | POA: Diagnosis not present

## 2024-05-09 DIAGNOSIS — R7309 Other abnormal glucose: Secondary | ICD-10-CM | POA: Diagnosis not present

## 2024-05-09 DIAGNOSIS — G801 Spastic diplegic cerebral palsy: Secondary | ICD-10-CM

## 2024-05-09 DIAGNOSIS — N189 Chronic kidney disease, unspecified: Secondary | ICD-10-CM

## 2024-05-09 DIAGNOSIS — M79645 Pain in left finger(s): Secondary | ICD-10-CM

## 2024-05-09 DIAGNOSIS — I10 Essential (primary) hypertension: Secondary | ICD-10-CM | POA: Diagnosis not present

## 2024-05-09 LAB — BAYER DCA HB A1C WAIVED: HB A1C (BAYER DCA - WAIVED): 6 % — ABNORMAL HIGH (ref 4.8–5.6)

## 2024-05-09 MED ORDER — ATORVASTATIN CALCIUM 40 MG PO TABS: 40.0000 mg | ORAL_TABLET | Freq: Every day | ORAL | 1 refills | Status: DC

## 2024-05-09 MED ORDER — OLMESARTAN MEDOXOMIL 20 MG PO TABS
20.0000 mg | ORAL_TABLET | Freq: Every day | ORAL | 0 refills | Status: DC
Start: 2024-05-09 — End: 2024-10-17

## 2024-05-09 NOTE — Progress Notes (Unsigned)
 Subjective:  Patient ID: Bradley Graham,  male    DOB: 1971-03-30  Age: 53 y.o.    CC: Medication Refill (pended)   HPI Bradley Graham presents for  follow-up of hypertension. Patient has no history of headache chest pain or shortness of breath or recent cough. Patient also denies symptoms of TIA such as numbness weakness lateralizing. Patient denies side effects from medication. States taking it regularly.  Patient also  in for follow-up of elevated cholesterol. Doing well without complaints on current medication. Denies side effects  including myalgia and arthralgia and nausea. Also in today for liver function testing. Currently no chest pain, shortness of breath or other cardiovascular related symptoms noted.  Follow-up of diabetes. Patient does check blood sugar at home. Readings run between 90 and 125 fasting. No preprandial checks recorded on his log sheet Patient denies symptoms such as excessive hunger or urinary frequency, excessive hunger, nausea No significant hypoglycemic spells noted. Medications reviewed. Pt reports taking them regularly. Pt. denies complication/adverse reaction today.   Bradley Graham has seen orthopedics at [facility] and been evaluated for scoliosis.  At this point they have recommended against surgery.  Arth is disappointed that this is partially because of his pain but he also states that he was hoping it would make him tolerate if he had had the surgery.  His orthopedist has recommended a regimen of medical care including spinal injections.  Mom is with Bradley Graham and they will probably go forward with that in hopes that surgery may offer something in the future.  History Bradley Graham has a past medical history of Atrial fibrillation (HCC), CHF (congestive heart failure) (HCC), CKD (chronic kidney disease), Club foot of both feet, Hypertension, Left ventricular hypertrophy, Right ventricular hypertrophy, Speech impediment, Tetralogy of Fallot, and Vitamin D  deficiency.   He has a  past surgical history that includes VSD repair; scoliosis; Adenoidectomy; Mouth surgery; Wrist surgery (Left); and Abdominal hernia repair.   His family history includes Cancer in his cousin; Cancer (age of onset: 70) in his maternal grandfather; Diabetes in his father; Diabetes insipidus in his mother; Hyperlipidemia in his father and mother; Hypertension in his father; Hyperthyroidism in his mother and sister; Pernicious anemia in his maternal grandfather.He reports that he has never smoked. He has never used smokeless tobacco. He reports that he does not drink alcohol and does not use drugs.  Current Outpatient Medications on File Prior to Visit  Medication Sig Dispense Refill   Accu-Chek Softclix Lancets lancets CHECK SUGAR IN THE MORNING, AT NOON, AND AT BEDTIME DX E11.9 300 each 3   allopurinol (ZYLOPRIM) 100 MG tablet Take 200 mg by mouth daily.     aspirin 81 MG tablet Take 81 mg by mouth daily.     blood glucose meter kit and supplies KIT Dispense based on patient and insurance preference. Use up to four times daily as directed. (FOR ICD-10 : E11.9 1 each 11   Blood Glucose Monitoring Suppl (ACCU-CHEK GUIDE ME) w/Device KIT CHECK SUGAR IN THE MORNING, AT NOON, AND AT BEDTIME DX E11.9 1 kit 0   Colchicine 0.6 MG CAPS Take 1 capsule by mouth every other day.     docusate sodium (COLACE) 100 MG capsule Take 100 mg by mouth daily.     famotidine  (PEPCID ) 10 MG tablet Take 1 tablet (10 mg total) by mouth 2 (two) times daily. 180 tablet 3   fexofenadine (ALLEGRA) 180 MG tablet Take by mouth.     fluticasone  (FLONASE ) 50 MCG/ACT  nasal spray PLACE 1 SPRAY INTO BOTH NOSTRILS 2 (TWO) TIMES DAILY 48 mL 1   glucose blood (ACCU-CHEK GUIDE) test strip CHECK SUGAR IN THE MORNING, AT NOON, AND AT BEDTIME DX E11.9 300 strip 3   linagliptin  (TRADJENTA ) 5 MG TABS tablet Take 1 tablet (5 mg total) by mouth daily. For diabetes 30 tablet 5   loratadine  (CLARITIN ) 10 MG tablet Take 1 tablet (10 mg total) by  mouth daily. 90 tablet 1   benzonatate  (TESSALON ) 200 MG capsule Take 1 capsule (200 mg total) by mouth 3 (three) times daily as needed for cough. (Patient not taking: Reported on 05/09/2024) 20 capsule 3   budesonide  (PULMICORT ) 1 MG/2ML nebulizer solution Take 2 mLs (1 mg total) by nebulization daily. (Patient not taking: Reported on 05/09/2024) 60 mL 12   formoterol  (PERFOROMIST ) 20 MCG/2ML nebulizer solution Take 2 mLs (20 mcg total) by nebulization 2 (two) times daily. (Patient not taking: Reported on 05/09/2024) 120 mL 1   Current Facility-Administered Medications on File Prior to Visit  Medication Dose Route Frequency Provider Last Rate Last Admin   betamethasone  acetate-betamethasone  sodium phosphate (CELESTONE ) injection 6 mg  6 mg Intramuscular Once         ROS Review of Systems  Constitutional:  Negative for fever.  Respiratory:  Negative for shortness of breath.   Cardiovascular:  Negative for chest pain.  Musculoskeletal:  Negative for arthralgias.  Skin:  Negative for rash.    Objective:  BP 124/74   Pulse 93   Temp 98 F (36.7 C)   Ht 5\' 3"  (1.6 m)   Wt 164 lb (74.4 kg)   SpO2 94%   BMI 29.05 kg/m   BP Readings from Last 3 Encounters:  05/09/24 124/74  03/06/24 (!) 128/90  01/29/24 (!) 128/90    Wt Readings from Last 3 Encounters:  05/09/24 164 lb (74.4 kg)  03/06/24 162 lb (73.5 kg)  01/29/24 162 lb (73.5 kg)    Lab Results  Component Value Date   HGBA1C 6.0 (H) 05/09/2024   HGBA1C 6.1 (H) 01/29/2024   HGBA1C 6.1 (H) 10/18/2023    Physical Exam Vitals reviewed.  Constitutional:      Appearance: He is well-developed.  HENT:     Head: Normocephalic and atraumatic.     Right Ear: External ear normal.     Left Ear: External ear normal.     Mouth/Throat:     Pharynx: No oropharyngeal exudate or posterior oropharyngeal erythema.  Eyes:     Pupils: Pupils are equal, round, and reactive to light.  Cardiovascular:     Rate and Rhythm: Normal rate and  regular rhythm.     Heart sounds: No murmur heard. Pulmonary:     Effort: No respiratory distress.     Breath sounds: Normal breath sounds.  Musculoskeletal:     Cervical back: Normal range of motion and neck supple.  Neurological:     Mental Status: He is alert and oriented to person, place, and time.         Assessment & Plan:  Stage 3b chronic kidney disease (HCC) -     Uric acid  Spastic cerebral palsy, congenital (HCC) -     CBC with Differential/Platelet -     CMP14+EGFR  Elevated hemoglobin A1c -     Bayer DCA Hb A1c Waived  Essential hypertension, benign -     CBC with Differential/Platelet -     CMP14+EGFR  Chronic kidney disease, unspecified CKD stage -  Olmesartan  Medoxomil; Take 1 tablet (20 mg total) by mouth daily.  Dispense: 90 tablet; Refill: 0  Finger pain, left -     DG Finger Little Left; Future  Other orders -     Atorvastatin  Calcium ; Take 1 tablet (40 mg total) by mouth daily.  Dispense: 90 tablet; Refill: 1    Follow-up: Return in about 3 months (around 08/09/2024).  Roise Cleaver, M.D.

## 2024-05-10 ENCOUNTER — Ambulatory Visit: Payer: Self-pay | Admitting: Family Medicine

## 2024-05-10 ENCOUNTER — Encounter: Payer: Self-pay | Admitting: Family Medicine

## 2024-05-10 LAB — CMP14+EGFR
ALT: 21 IU/L (ref 0–44)
AST: 22 IU/L (ref 0–40)
Albumin: 4.4 g/dL (ref 3.8–4.9)
Alkaline Phosphatase: 180 IU/L — ABNORMAL HIGH (ref 44–121)
BUN/Creatinine Ratio: 14 (ref 9–20)
BUN: 25 mg/dL — ABNORMAL HIGH (ref 6–24)
Bilirubin Total: 0.5 mg/dL (ref 0.0–1.2)
CO2: 21 mmol/L (ref 20–29)
Calcium: 9 mg/dL (ref 8.7–10.2)
Chloride: 102 mmol/L (ref 96–106)
Creatinine, Ser: 1.77 mg/dL — ABNORMAL HIGH (ref 0.76–1.27)
Globulin, Total: 2.1 g/dL (ref 1.5–4.5)
Glucose: 180 mg/dL — ABNORMAL HIGH (ref 70–99)
Potassium: 4.6 mmol/L (ref 3.5–5.2)
Sodium: 139 mmol/L (ref 134–144)
Total Protein: 6.5 g/dL (ref 6.0–8.5)
eGFR: 45 mL/min/{1.73_m2} — ABNORMAL LOW (ref >59)

## 2024-05-10 LAB — CBC WITH DIFFERENTIAL/PLATELET
Basophils Absolute: 0.1 10*3/uL (ref 0.0–0.2)
Basos: 1 %
EOS (ABSOLUTE): 0.7 10*3/uL — ABNORMAL HIGH (ref 0.0–0.4)
Eos: 12 %
Hematocrit: 38.9 % (ref 37.5–51.0)
Hemoglobin: 13 g/dL (ref 13.0–17.7)
Immature Grans (Abs): 0 10*3/uL (ref 0.0–0.1)
Immature Granulocytes: 0 %
Lymphocytes Absolute: 1.1 10*3/uL (ref 0.7–3.1)
Lymphs: 18 %
MCH: 32.2 pg (ref 26.6–33.0)
MCHC: 33.4 g/dL (ref 31.5–35.7)
MCV: 96 fL (ref 79–97)
Monocytes Absolute: 0.5 10*3/uL (ref 0.1–0.9)
Monocytes: 8 %
Neutrophils Absolute: 3.6 10*3/uL (ref 1.4–7.0)
Neutrophils: 61 %
Platelets: 128 10*3/uL — ABNORMAL LOW (ref 150–450)
RBC: 4.04 x10E6/uL — ABNORMAL LOW (ref 4.14–5.80)
RDW: 13.4 % (ref 11.6–15.4)
WBC: 6 10*3/uL (ref 3.4–10.8)

## 2024-05-10 LAB — URIC ACID: Uric Acid: 3.6 mg/dL — ABNORMAL LOW (ref 3.8–8.4)

## 2024-05-11 NOTE — Progress Notes (Signed)
Hello Kennith,  Your lab result is normal and/or stable.Some minor variations that are not significant are commonly marked abnormal, but do not represent any medical problem for you.  Best regards, [name], M.D.

## 2024-07-28 ENCOUNTER — Other Ambulatory Visit: Payer: Self-pay | Admitting: Family Medicine

## 2024-08-14 ENCOUNTER — Encounter: Payer: Self-pay | Admitting: Family Medicine

## 2024-08-14 ENCOUNTER — Ambulatory Visit (INDEPENDENT_AMBULATORY_CARE_PROVIDER_SITE_OTHER): Admitting: Family Medicine

## 2024-08-14 VITALS — BP 142/78 | HR 101 | Temp 98.0°F | Ht 63.0 in | Wt 165.0 lb

## 2024-08-14 DIAGNOSIS — Z7984 Long term (current) use of oral hypoglycemic drugs: Secondary | ICD-10-CM

## 2024-08-14 DIAGNOSIS — G801 Spastic diplegic cerebral palsy: Secondary | ICD-10-CM | POA: Diagnosis not present

## 2024-08-14 DIAGNOSIS — I10 Essential (primary) hypertension: Secondary | ICD-10-CM | POA: Diagnosis not present

## 2024-08-14 DIAGNOSIS — E119 Type 2 diabetes mellitus without complications: Secondary | ICD-10-CM

## 2024-08-14 DIAGNOSIS — N1832 Chronic kidney disease, stage 3b: Secondary | ICD-10-CM

## 2024-08-14 LAB — BAYER DCA HB A1C WAIVED: HB A1C (BAYER DCA - WAIVED): 6.2 % — ABNORMAL HIGH (ref 4.8–5.6)

## 2024-08-14 MED ORDER — VITAMIN D (ERGOCALCIFEROL) 1.25 MG (50000 UNIT) PO CAPS: 50000.0000 [IU] | ORAL_CAPSULE | ORAL | 3 refills | Status: AC

## 2024-08-14 NOTE — Progress Notes (Signed)
 "  Subjective:  Patient ID: Bradley Graham, male    DOB: [birth year]  Age: 53 y.o. MRN: [id number]  CC: Medical Management of Chronic Issues   HPI  Discussed the use of AI scribe software for clinical note transcription with the patient, who gave verbal consent to proceed.  History of Present Illness Bradley Graham is a 53 year old male with hypertension and coronary artery disease who presents for a follow-up visit.  He is managing hypertension with home blood pressure monitoring, showing systolic readings mostly in the 120s, occasionally reaching 130s, and diastolic readings in the 70s, with a peak systolic of 132 and diastolic of 83.  He is managing diabetes with Mounjaro. There was an insurance issue requiring an increase in dosage from 2.5 mg to 5 mg.  He is due for vaccinations including Shingrix, pneumonia, and flu shots. He has previously declined the flu shot due to feeling unwell afterward, though he typically receives it annually. He has not had the pneumonia or shingles vaccines before.  He had a recent lab workup two weeks ago but did not complete the A1c test.          08/14/2024    9:21 AM 03/06/2024    2:38 PM 01/29/2024   10:28 AM  Depression screen PHQ 2/9  Decreased Interest 1 0 0  Down, Depressed, Hopeless 0 1 0  PHQ - 2 Score 1 1 0  Altered sleeping 0 1 0  Tired, decreased energy 0 1 0  Change in appetite 0 0 0  Feeling bad or failure about yourself  0 1 0  Trouble concentrating 0 1 0  Moving slowly or fidgety/restless 0 0 0  Suicidal thoughts 0 0 0  PHQ-9 Score 1 5 0  Difficult doing work/chores Not difficult at all Not difficult at all Not difficult at all    History Bradley Graham has a past medical history of Atrial fibrillation (HCC), CHF (congestive heart failure) (HCC), CKD (chronic kidney disease), Club foot of both feet, Hypertension, Left ventricular hypertrophy, Right ventricular hypertrophy, Speech impediment, Tetralogy of Fallot, and Vitamin D   deficiency.   He has a past surgical history that includes VSD repair; scoliosis; Adenoidectomy; Mouth surgery; Wrist surgery (Left); and Abdominal hernia repair.   His family history includes Cancer in his cousin; Cancer (age of onset: 21) in his maternal grandfather; Diabetes in his father; Diabetes insipidus in his mother; Hyperlipidemia in his father and mother; Hypertension in his father; Hyperthyroidism in his mother and sister; Pernicious anemia in his maternal grandfather.He reports that he has never smoked. He has never used smokeless tobacco. He reports that he does not drink alcohol and does not use drugs.    ROS Review of Systems  Constitutional: Negative.   HENT: Negative.    Eyes:  Negative for visual disturbance.  Respiratory:  Negative for cough and shortness of breath.   Cardiovascular:  Negative for chest pain and leg swelling.  Gastrointestinal:  Negative for abdominal pain, diarrhea, nausea and vomiting.  Genitourinary:  Negative for difficulty urinating.  Musculoskeletal:  Negative for arthralgias and myalgias.  Skin:  Negative for rash.  Neurological:  Negative for headaches.  Psychiatric/Behavioral:  Negative for sleep disturbance.     Objective:  BP (!) 142/78   Pulse (!) 101   Temp 98 F (36.7 C)   Ht 5' 3 (1.6 m)   Wt 165 lb (74.8 kg)   SpO2 95%   BMI 29.23 kg/m   BP Readings from  Last 3 Encounters:  08/14/24 (!) 142/78  05/09/24 124/74  03/06/24 (!) 128/90    Wt Readings from Last 3 Encounters:  08/14/24 165 lb (74.8 kg)  05/09/24 164 lb (74.4 kg)  03/06/24 162 lb (73.5 kg)     Physical Exam Vitals reviewed.  Constitutional:      Appearance: He is well-developed.  HENT:     Head: Normocephalic and atraumatic.     Right Ear: External ear normal.     Left Ear: External ear normal.     Mouth/Throat:     Pharynx: No oropharyngeal exudate or posterior oropharyngeal erythema.  Eyes:     Pupils: Pupils are equal, round, and reactive to  light.  Cardiovascular:     Rate and Rhythm: Normal rate and regular rhythm.     Heart sounds: No murmur heard. Pulmonary:     Effort: No respiratory distress.     Breath sounds: Normal breath sounds.  Musculoskeletal:     Cervical back: Normal range of motion and neck supple.  Neurological:     Mental Status: He is alert and oriented to person, place, and time.      Assessment & Plan:  Stage 3b chronic kidney disease (HCC)  Diabetes mellitus treated with oral medication (HCC) -     Bayer DCA Hb A1c Waived -     Microalbumin / creatinine urine ratio  Essential hypertension, benign  Spastic cerebral palsy, congenital (HCC)  Other orders -     Vitamin D  (Ergocalciferol ); Take 1 capsule (50,000 Units total) by mouth every 7 (seven) days.  Dispense: 13 capsule; Refill: 3   The  Assessment and Plan Assessment & Plan Type 2 diabetes mellitus   Blood glucose levels range from 96 to 127 mg/dL, which is within the target range. Medication adjustment is needed due to insurance requirements for Mounjaro, increasing the dose to 5 mg from 2.5 mg. Order an A1c test via finger stick and check with the pharmacy regarding the [facility] prescription adjustment to 5 mg.  Essential hypertension   Recent blood pressure readings are mostly in the 120s/70s, occasionally in the 130s/80s, indicating well-controlled blood pressure. Continue the current blood pressure monitoring regimen, checking twice daily, fasting and two hours after the largest meal of the day.    PT. Followed by nephrology for chronic renal insufficiency.    Follow-up: No follow-ups on file.  Bradley Graham, M.D. "

## 2024-08-16 ENCOUNTER — Encounter: Payer: Self-pay | Admitting: Family Medicine

## 2024-08-17 ENCOUNTER — Other Ambulatory Visit: Payer: Self-pay | Admitting: Family Medicine

## 2024-08-18 ENCOUNTER — Ambulatory Visit: Payer: Self-pay | Admitting: Family Medicine

## 2024-10-17 ENCOUNTER — Other Ambulatory Visit: Payer: Self-pay | Admitting: Family Medicine

## 2024-10-17 DIAGNOSIS — N189 Chronic kidney disease, unspecified: Secondary | ICD-10-CM

## 2024-10-18 ENCOUNTER — Other Ambulatory Visit: Payer: Self-pay | Admitting: *Deleted

## 2024-11-14 ENCOUNTER — Ambulatory Visit: Payer: Self-pay | Admitting: Family Medicine

## 2024-11-14 VITALS — BP 106/66 | HR 93 | Temp 97.2°F | Wt 163.0 lb

## 2024-11-14 DIAGNOSIS — R7309 Other abnormal glucose: Secondary | ICD-10-CM

## 2024-11-14 DIAGNOSIS — N1832 Chronic kidney disease, stage 3b: Secondary | ICD-10-CM | POA: Diagnosis not present

## 2024-11-14 DIAGNOSIS — M2042 Other hammer toe(s) (acquired), left foot: Secondary | ICD-10-CM

## 2024-11-14 DIAGNOSIS — E119 Type 2 diabetes mellitus without complications: Secondary | ICD-10-CM

## 2024-11-14 DIAGNOSIS — E66811 Obesity, class 1: Secondary | ICD-10-CM

## 2024-11-14 DIAGNOSIS — M79672 Pain in left foot: Secondary | ICD-10-CM

## 2024-11-14 DIAGNOSIS — N179 Acute kidney failure, unspecified: Secondary | ICD-10-CM | POA: Diagnosis not present

## 2024-11-14 DIAGNOSIS — Z7984 Long term (current) use of oral hypoglycemic drugs: Secondary | ICD-10-CM | POA: Diagnosis not present

## 2024-11-14 DIAGNOSIS — M109 Gout, unspecified: Secondary | ICD-10-CM

## 2024-11-14 DIAGNOSIS — N189 Chronic kidney disease, unspecified: Secondary | ICD-10-CM

## 2024-11-14 LAB — COMPREHENSIVE METABOLIC PANEL WITH GFR
ALT: 18 IU/L (ref 0–44)
AST: 22 IU/L (ref 0–40)
Albumin: 4.8 g/dL (ref 3.8–4.9)
Alkaline Phosphatase: 125 IU/L — ABNORMAL HIGH (ref 47–123)
BUN/Creatinine Ratio: 16 (ref 9–20)
BUN: 36 mg/dL — ABNORMAL HIGH (ref 6–24)
Bilirubin Total: 0.7 mg/dL (ref 0.0–1.2)
CO2: 21 mmol/L (ref 20–29)
Calcium: 10 mg/dL (ref 8.7–10.2)
Chloride: 102 mmol/L (ref 96–106)
Creatinine, Ser: 2.29 mg/dL — ABNORMAL HIGH (ref 0.76–1.27)
Globulin, Total: 1.9 g/dL (ref 1.5–4.5)
Glucose: 119 mg/dL — ABNORMAL HIGH (ref 70–99)
Potassium: 4.9 mmol/L (ref 3.5–5.2)
Sodium: 140 mmol/L (ref 134–144)
Total Protein: 6.7 g/dL (ref 6.0–8.5)
eGFR: 33 mL/min/1.73 — ABNORMAL LOW (ref >59)

## 2024-11-14 LAB — CBC WITH DIFFERENTIAL/PLATELET
Basophils Absolute: 0.1 x10E3/uL (ref 0.0–0.2)
Basos: 1 %
EOS (ABSOLUTE): 0.8 x10E3/uL — ABNORMAL HIGH (ref 0.0–0.4)
Eos: 10 %
Hematocrit: 42.4 % (ref 37.5–51.0)
Hemoglobin: 13.8 g/dL (ref 13.0–17.7)
Immature Grans (Abs): 0 x10E3/uL (ref 0.0–0.1)
Immature Granulocytes: 0 %
Lymphocytes Absolute: 1.3 x10E3/uL (ref 0.7–3.1)
Lymphs: 15 %
MCH: 31.4 pg (ref 26.6–33.0)
MCHC: 32.5 g/dL (ref 31.5–35.7)
MCV: 97 fL (ref 79–97)
Monocytes Absolute: 0.6 x10E3/uL (ref 0.1–0.9)
Monocytes: 7 %
Neutrophils Absolute: 5.7 x10E3/uL (ref 1.4–7.0)
Neutrophils: 67 %
Platelets: 126 x10E3/uL — ABNORMAL LOW (ref 150–450)
RBC: 4.39 x10E6/uL (ref 4.14–5.80)
RDW: 13.9 % (ref 11.6–15.4)
WBC: 8.5 x10E3/uL (ref 3.4–10.8)

## 2024-11-14 LAB — BAYER DCA HB A1C WAIVED: HB A1C (BAYER DCA - WAIVED): 5.7 % — ABNORMAL HIGH (ref 4.8–5.6)

## 2024-11-14 LAB — URIC ACID: Uric Acid: 3.6 mg/dL — ABNORMAL LOW (ref 3.8–8.4)

## 2024-11-14 MED ORDER — ACCU-CHEK GUIDE ME W/DEVICE KIT: PACK | 0 refills | Status: DC

## 2024-11-14 MED ORDER — OLMESARTAN MEDOXOMIL 20 MG PO TABS: 20.0000 mg | ORAL_TABLET | Freq: Every day | ORAL | 0 refills | Status: AC

## 2024-11-14 NOTE — Progress Notes (Signed)
 Subjective:  Patient ID: Bradley Graham, male    DOB: [birth year]  Age: 53 y.o. MRN: [id number]  CC: Medical Management of Chronic Issues   HPI  Discussed the use of AI scribe software for clinical note transcription with the patient, who gave verbal consent to proceed.  History of Present Illness Bradley Graham is a 53 year old male with hypertension and diabetes who presents for a follow-up visit. He is accompanied by his mother, [name].  He has been experiencing variable blood pressure readings at home, with a recent low of 80/30 mmHg, which he attributes to not eating due to reflux symptoms.  He has a history of diabetes and has been monitoring his blood sugar levels. Recent readings from September to November have been mostly between 100 and 130 mg/dL, with occasional higher readings in early November. His A1c is 5.7%. He is currently using a glucose monitor, but it has malfunctioned, and he requires a new one.  He has a history of foot surgery and is experiencing issues with hammer toes on the foot that had surgery. He reports soreness and rubbing in regular shoes. He has a history of club feet corrected at birth, and the hammer toes developed after a previous foot surgery approximately 10-15 years ago.  He has a history of kidney issues and is under the care of a kidney specialist.  He has a history of reflux and has been avoiding certain foods to manage symptoms. This has led to decreased food intake and contributed to the recent low blood pressure episode.          08/14/2024    9:21 AM 03/06/2024    2:38 PM 01/29/2024   10:28 AM  Depression screen PHQ 2/9  Decreased Interest 1 0 0  Down, Depressed, Hopeless 0 1 0  PHQ - 2 Score 1 1 0  Altered sleeping 0 1 0  Tired, decreased energy 0 1 0  Change in appetite 0 0 0  Feeling bad or failure about yourself  0 1 0  Trouble concentrating 0 1 0  Moving slowly or fidgety/restless 0 0 0  Suicidal thoughts 0 0 0  PHQ-9 Score 1  5   0   Difficult doing work/chores Not difficult at all Not difficult at all Not difficult at all     Data saved with a previous flowsheet row definition    History [name] has a past medical history of Atrial fibrillation (HCC), CHF (congestive heart failure) (HCC), CKD (chronic kidney disease), Club foot of both feet, Hypertension, Left ventricular hypertrophy, Right ventricular hypertrophy, Speech impediment, Tetralogy of Fallot, and Vitamin D  deficiency.   He has a past surgical history that includes VSD repair; scoliosis; Adenoidectomy; Mouth surgery; Wrist surgery (Left); and Abdominal hernia repair.   His family history includes Cancer in his cousin; Cancer (age of onset: 64) in his maternal grandfather; Diabetes in his father; Diabetes insipidus in his mother; Hyperlipidemia in his father and mother; Hypertension in his father; Hyperthyroidism in his mother and sister; Pernicious anemia in his maternal grandfather.He reports that he has never smoked. He has never used smokeless tobacco. He reports that he does not drink alcohol and does not use drugs.    ROS Review of Systems  Constitutional: Negative.   HENT: Negative.    Eyes:  Negative for visual disturbance.  Respiratory:  Negative for cough and shortness of breath.   Cardiovascular:  Negative for chest pain and leg swelling.  Gastrointestinal:  Negative  for abdominal pain, diarrhea, nausea and vomiting.  Genitourinary:  Negative for difficulty urinating.  Musculoskeletal:  Negative for arthralgias and myalgias.  Skin:  Negative for rash.  Neurological:  Negative for headaches.  Psychiatric/Behavioral:  Negative for sleep disturbance.     Objective:  BP 106/66   Pulse 93   Temp (!) 97.2 F (36.2 C)   Wt 163 lb (73.9 kg)   SpO2 96%   BMI 28.87 kg/m   BP Readings from Last 3 Encounters:  11/14/24 106/66  08/14/24 (!) 142/78  05/09/24 124/74    Wt Readings from Last 3 Encounters:  11/14/24 163 lb (73.9 kg)   08/14/24 165 lb (74.8 kg)  05/09/24 164 lb (74.4 kg)     Physical Exam Vitals reviewed.  Constitutional:      Appearance: He is well-developed.  HENT:     Head: Normocephalic and atraumatic.     Right Ear: External ear normal.     Left Ear: External ear normal.     Mouth/Throat:     Pharynx: No oropharyngeal exudate or posterior oropharyngeal erythema.  Eyes:     Pupils: Pupils are equal, round, and reactive to light.  Cardiovascular:     Rate and Rhythm: Normal rate and regular rhythm.     Heart sounds: No murmur heard. Pulmonary:     Effort: No respiratory distress.     Breath sounds: Normal breath sounds.  Musculoskeletal:     Cervical back: Normal range of motion and neck supple.  Neurological:     Mental Status: He is alert and oriented to person, place, and time.    Physical Exam VITALS: BP- 142/ GENERAL: Alert, cooperative, well developed, no acute distress. HEENT: Normocephalic, normal oropharynx, moist mucous membranes. CHEST: Clear to auscultation bilaterally, no wheezes, rhonchi, or crackles. CARDIOVASCULAR: Normal heart rate and rhythm, S1 and S2 normal without murmurs. ABDOMEN: Soft, non-tender, non-distended, without organomegaly, normal bowel sounds. EXTREMITIES: No cyanosis or edema. MUSCULOSKELETAL: Foot with hammer toes, post-surgery changes. NEUROLOGICAL: Cranial nerves grossly intact, moves all extremities without gross motor or sensory deficit.   Assessment & Plan:  Diabetes mellitus treated with oral medication (HCC) -     Bayer DCA Hb A1c Waived -     Comprehensive metabolic panel with GFR  Chronic kidney disease, unspecified CKD stage -     Bayer DCA Hb A1c Waived -     Microalbumin / creatinine urine ratio -     CBC with Differential/Platelet -     Uric acid -     Comprehensive metabolic panel with GFR -     Olmesartan  Medoxomil; Take 1 tablet (20 mg total) by mouth daily.  Dispense: 90 tablet; Refill: 0  Stage 3b chronic kidney disease  (HCC) -     Bayer DCA Hb A1c Waived -     Microalbumin / creatinine urine ratio -     CBC with Differential/Platelet -     Uric acid -     Comprehensive metabolic panel with GFR  Elevated hemoglobin A1c -     Bayer DCA Hb A1c Waived  Obesity (BMI 30.0-34.9) -     CBC with Differential/Platelet  Acute renal failure, unspecified acute renal failure type -     Comprehensive metabolic panel with GFR  Gout, unspecified cause, unspecified chronicity, unspecified site -     Uric acid  Left foot pain  Acquired hammer toe of left foot  Other orders -     Atorvastatin  Calcium ; Take 1 tablet (  40 mg total) by mouth daily.  Dispense: 90 tablet; Refill: 1 -     Accu-Chek Guide Me; CHECK SUGAR IN THE MORNING, AT NOON, AND AT BEDTIME DX E11.9  Dispense: 1 kit; Refill: 0    Assessment and Plan Assessment & Plan Type 2 diabetes mellitus   Blood glucose levels are well-controlled, with readings mostly between 100-130 mg/dL and an J8r of 4.2%, indicating good glycemic control. No significant issues with current diabetes management. Continue the current regimen and use the new glucose monitor provided.  Chronic kidney disease, stage 3b   Kidney function is stable with no significant changes in creatinine levels. Regular follow-up with the nephrologist is ongoing. Continue monitoring kidney function with the nephrologist.  Hypertension   Blood pressure readings are variable, with some as low as 80/30 mmHg, likely due to dietary changes and medication effects. Home readings are generally well-controlled. Recent low readings prompted a temporary reduction in olmesartan  dose to half a pill. Monitor blood pressure closely at home and hold the evening dose of olmesartan  if blood pressure remains low.  Acquired hammer toe of left foot   Hammer toe developed post-surgery on the left foot, causing pain and discomfort, especially when wearing regular shoes. Previous consultation with Dr. Dick noted, but  no recent intervention. Referred to Dr. Roddie for evaluation and management.       Follow-up: No follow-ups on file.  Butler Der, M.D.

## 2024-11-15 LAB — MICROALBUMIN / CREATININE URINE RATIO
Creatinine, Urine: 113.4 mg/dL
Microalb/Creat Ratio: 21 mg/g{creat} (ref 0–29)
Microalbumin, Urine: 24.1 ug/mL

## 2024-11-16 ENCOUNTER — Encounter: Payer: Self-pay | Admitting: Family Medicine

## 2024-12-10 ENCOUNTER — Ambulatory Visit: Payer: Self-pay

## 2024-12-10 NOTE — Telephone Encounter (Signed)
"  Appt noted  "

## 2024-12-10 NOTE — Telephone Encounter (Signed)
 Apt made. LS

## 2024-12-10 NOTE — Telephone Encounter (Signed)
 Hx gout, takes colchicine for breakthrough pain and allopurinol.  Pain for 1-1.5 month, limps.   FYI Only or Action Required?: FYI only for provider: appointment scheduled on 12/12/24.  Patient was last seen in primary care on 11/14/2024 by Zollie Lowers, MD.  Called Nurse Triage reporting Gout.  Symptoms began about a month ago.  Interventions attempted: Prescription medications: colchicine, allopurinol.  Symptoms are: gradually worsening.  Triage Disposition: See PCP When Office is Open (Within 3 Days)  Patient/caregiver understands and will follow disposition?: Yes  Copied from CRM #[id number]. Topic: Clinical - Red Word Triage >> Dec 10, 2024  4:12 PM Miquel SAILOR wrote: Red Word that prompted transfer to Nurse Triage: PT mother [name] states PT has severe Gout LT foot  pain/for 1 month/can not sleep. Not with son but can get a hold of him if needed. Reason for Disposition  [1] MODERATE pain (e.g., interferes with normal activities, limping) AND [2] present > 3 days  Answer Assessment - Initial Assessment Questions 1. ONSET: When did the pain start?      Worse over the last month to month and a half  2. LOCATION: Where is the pain located?      Foot, mother of pt unsure which  3. PAIN: How bad is the pain?    (Scale 1-10; or mild, moderate, severe)     Walks with a limp, he hobbles around.   5. CAUSE: What do you think is causing the foot pain?     Gout flare up and possibly osteoarthritis  6. OTHER SYMPTOMS: Do you have any other symptoms? (e.g., leg pain, rash, fever, numbness)     Denies.  Mother unsure if pt has taken any medication for pain today.  Protocols used: Foot Pain-A-AH

## 2024-12-11 ENCOUNTER — Other Ambulatory Visit: Payer: Self-pay | Admitting: Family Medicine

## 2024-12-12 ENCOUNTER — Ambulatory Visit

## 2024-12-12 ENCOUNTER — Ambulatory Visit (INDEPENDENT_AMBULATORY_CARE_PROVIDER_SITE_OTHER): Admitting: Family

## 2024-12-12 ENCOUNTER — Encounter: Payer: Self-pay | Admitting: Family

## 2024-12-12 VITALS — BP 132/79 | HR 82 | Temp 97.4°F | Ht 63.0 in | Wt 164.6 lb

## 2024-12-12 DIAGNOSIS — N1832 Chronic kidney disease, stage 3b: Secondary | ICD-10-CM

## 2024-12-12 DIAGNOSIS — M79675 Pain in left toe(s): Secondary | ICD-10-CM | POA: Diagnosis not present

## 2024-12-12 DIAGNOSIS — M2042 Other hammer toe(s) (acquired), left foot: Secondary | ICD-10-CM

## 2024-12-12 DIAGNOSIS — M1A0720 Idiopathic chronic gout, left ankle and foot, without tophus (tophi): Secondary | ICD-10-CM

## 2024-12-12 NOTE — Patient Instructions (Addendum)
 " Hammer toe is when your toe bends in a way that makes it look like a hammer. This usually happens to your second, third, or fourth toe. At first, you can straighten the toe, but over time it can get stiff and stay bent. A hammer toe can cause pain when you wear shoes. In some cases, the toe rubs against the shoe and can form bumps called corns or calluses. Early treatments to keep your toe straight may help with pain. As hammer toe gets worse and your toe gets stiff, you may need surgery to fix it. What are the causes? Hammer toe happens when the joint near your feet bends too much. Over time, the bending pulls on the muscles and the tissues that connect muscle to bone (tendons). This can make the muscles and tendons weak and stiff. Wearing shoes that are too narrow in the toe box and don't let your toes stay straight can cause a hammer toe. What increases the risk? You're more likely to get hammer toe if: You're an older male. You wear shoes that are too small or high heels that pinch your toes. Your second toe is longer than your big toe. You hurt your foot or toe. You have: Arthritis. A nerve or muscle problem. Diabetes. Charcot joint. This affects how you walk. Other people in your family have hammer toe. You're a advertising account planner. What are the signs or symptoms?  Pain in your toe, such as when you walk or wear shoes. A thick patch of skin on the bent part of the toe or between your toes. This patch is called a corn or callus. Redness and a burning feeling on the bent toe. An open sore on top of the bent toe. Not being able to straighten the bent toe. How is this diagnosed? You may be diagnosed based on your symptoms and an exam. Your health care provider will check your toe and try to straighten it. You may also have some tests, such as: A blood test to check for arthritis or diabetes. An X-ray to show how bad the bending is. How is this treated? If you can still straighten or  move your toe, treatment may include: Taping the toe to keep it straight. Using pads and cushions to protect the bent toe. Wearing shoes that have enough room for the toes. Doing exercises to stretch your toe. Taking medicine to help with pain and swelling. Using special insoles to help with the pain and walking. If these treatments don't work or if the toe is very bent, you may need surgery. Common surgeries include: Arthroplasty or osteotomy. Part of the toe joint is remade or removed. This lets the toe straighten. Fusion. The bones are joined together into one long bone. Implantation. Part of the bone is removed. It's replaced with an implant to help your toe move. Flexor tendon transfer. The tendons that curl the toes down are removed. These are called your flexor tendons. Follow these instructions at home: Take your medicines only as told. Exercise as told. You may need to do exercises to straighten or stretch your toe. How is this prevented? Wear shoes that fit well and give your toes enough space. Shoes aren't supposed to hurt. Buy shoes at the end of the day when your feet might be a little bigger. As you age, your shoe size and width might change. Measure both feet. Buy shoes for the larger foot. A shoe repair store might be able to stretch shoes that  feel tight. Do not wear high heels or shoes with pointed toes. Contact a health care provider if: Your pain gets worse. Your toe gets red or swollen. You get an open sore on your toe. This information is not intended to replace advice given to you by your health care provider. Make sure you discuss any questions you have with your health care provider. Document Revised: 12/15/2023 Document Reviewed: 12/15/2023 Elsevier Patient Education  2025 [employer]. "

## 2024-12-12 NOTE — Progress Notes (Signed)
 "  Subjective:    Patient ID: Bradley Graham, male    DOB: 1971-09-28, 54 y.o.   MRN: [id number]  Chief Complaint  Patient presents with   Foot Pain    Left foot pain. This has been going on for a while last uric acid normal.  He does have hx of gout. Specialist thinks its arthritis. He has had surgery before. Has hammer toes5   PT presents to the office today with left great toe pain that started for two months. Has hx of hammer toe and gout. His last uric acid was 3.6 and taking allopurinol. His Rheumatologists thought it might be arthritis.  Foot Pain This is a new problem. The current episode started more than 1 month ago. The problem occurs constantly. The problem has been unchanged. Pertinent negatives include no fatigue, fever, headaches, joint swelling, sore throat, swollen glands, urinary symptoms or vertigo. He has tried rest for the symptoms. The treatment provided mild relief.      Review of Systems  Constitutional:  Negative for fatigue and fever.  HENT:  Negative for sore throat.   Musculoskeletal:  Negative for joint swelling.  Neurological:  Negative for vertigo and headaches.  All other systems reviewed and are negative.   Social History   Socioeconomic History   Marital status: Single    Spouse name: Not on file   Number of children: Not on file   Years of education: Not on file   Highest education level: 12th grade  Occupational History   Not on file  Tobacco Use   Smoking status: Never   Smokeless tobacco: Never  Vaping Use   Vaping status: Never Used  Substance and Sexual Activity   Alcohol use: No   Drug use: No   Sexual activity: Never  Other Topics Concern   Not on file  Social History Narrative   Single, lives with parents.   Social Drivers of Health   Tobacco Use: Low Risk (12/12/2024)   Patient History    Smoking Tobacco Use: Never    Smokeless Tobacco Use: Never    Passive Exposure: Not on file  Financial Resource Strain: Low Risk   (04/08/2024)   Received from [facility]   Overall Financial Resource Strain (CARDIA)    Difficulty of Paying Living Expenses: Not hard at all  Food Insecurity: Low Risk (08/01/2024)   Received from [facility]   Epic    Within the past 12 months, you worried that your food would run out before you got money to buy more: Never true    Within the past 12 months, the food you bought just didn't last and you didn't have money to get more. : Never true  Transportation Needs: No Transportation Needs (08/01/2024)   [facility]   Transportation    In the past 12 months, has lack of reliable transportation kept you from medical appointments, meetings, work or from getting things needed for daily living? : No  Physical Activity: Sufficiently Active (03/06/2024)   Exercise Vital Sign    Days of Exercise per Week: 4 days    Minutes of Exercise per Session: 50 min  Stress: No Stress Concern Present (03/06/2024)   [employer] of Occupational Health - Occupational Stress Questionnaire    Feeling of Stress : Not at all  Social Connections: Moderately Isolated (03/06/2024)   Social Connection and Isolation Panel    Frequency of Communication with Friends and Family: More than three times  a week    Frequency of Social Gatherings with Friends and Family: More than three times a week    Attends Religious Services: More than 4 times per year    Active Member of Clubs or Organizations: No    Attends Banker Meetings: Never    Marital Status: Never married  Depression (PHQ2-9): Low Risk (08/14/2024)   Depression (PHQ2-9)    PHQ-2 Score: 1  Alcohol Screen: Low Risk (03/06/2024)   Alcohol Screen    Last Alcohol Screening Score (AUDIT): 0  Housing: Low Risk (08/01/2024)   Received from [facility]   Epic    What is your living situation today?: I have a steady place to live    Think about the place you live. Do you have problems with any of the following?  Choose all that apply:: None/None on this list  Utilities: Low Risk (08/01/2024)   Received from [facility]   Utilities    In the past 12 months has the electric, gas, oil, or water company threatened to shut off services in your home? : No  Health Literacy: Adequate Health Literacy (03/06/2024)   B1300 Health Literacy    Frequency of need for help with medical instructions: Never   Family History  Problem Relation Age of Onset   Hyperlipidemia Mother    Hyperthyroidism Mother    Diabetes insipidus Mother    Diabetes Father    Hyperlipidemia Father    Hypertension Father    Hyperthyroidism Sister    Cancer Maternal Grandfather 70       breast cancer   Pernicious anemia Maternal Grandfather    Cancer Cousin        colon cance        Objective:   Physical Exam Vitals reviewed.  Constitutional:      General: He is not in acute distress.    Appearance: He is well-developed.  HENT:     Head: Normocephalic.  Eyes:     General:        Right eye: No discharge.        Left eye: No discharge.     Pupils: Pupils are equal, round, and reactive to light.  Neck:     Thyroid: No thyromegaly.  Cardiovascular:     Rate and Rhythm: Normal rate and regular rhythm.     Heart sounds: Normal heart sounds. No murmur heard. Pulmonary:     Effort: Pulmonary effort is normal. No respiratory distress.     Breath sounds: Normal breath sounds. No wheezing.  Abdominal:     General: Bowel sounds are normal. There is no distension.     Palpations: Abdomen is soft.     Tenderness: There is no abdominal tenderness.  Musculoskeletal:        General: Tenderness and deformity present.     Cervical back: Normal range of motion and neck supple.     Comments: Left great toe bent, erythemas, swollen, and tender. No warmth noted  Skin:    General: Skin is warm and dry.     Findings: Erythema present. No rash.  Neurological:     Mental Status: He is alert.     Deep Tendon Reflexes: Reflexes are  normal and symmetric.  Psychiatric:        Behavior: Behavior normal.        Thought Content: Thought content normal.        Judgment: Judgment normal.  BP 132/79   Pulse 82   Temp (!) 97.4 F (36.3 C) (Temporal)   Ht 5' 3 (1.6 m)   Wt 164 lb 9.6 oz (74.7 kg)   SpO2 95%   BMI 29.16 kg/m      Assessment & Plan:  Brook L Bearce comes in today with chief complaint of Foot Pain (Left foot pain. This has been going on for a while last uric acid normal.  He does have hx of gout. Specialist thinks its arthritis. He has had surgery before. Has hammer toes5)   Diagnosis and orders addressed:  1. Pain of left great toe (Primary) - Ambulatory referral to Podiatry - DG Toe Great Left; Future  2. Chronic gout of left foot, unspecified cause - Ambulatory referral to Podiatry - DG Toe Great Left; Future  3. Acquired hammer toe of left foot - Ambulatory referral to Podiatry - DG Toe Great Left; Future  4. Stage 3b chronic kidney disease (HCC) Can not take NSAID's because of CKD   X-ray pending Referral to Podiatry  Avoiding NSAID's because of CKD Tylenol as needed Continue Allopurinol  Follow up if symptoms worsen or do not improve    Bari Learn, FNP   "

## 2024-12-20 ENCOUNTER — Ambulatory Visit: Payer: Self-pay | Admitting: Family

## 2025-02-13 ENCOUNTER — Ambulatory Visit: Admitting: Family Medicine
